# Patient Record
Sex: Female | Born: 1945 | Race: White | Hispanic: No | Marital: Married | Smoking: Never smoker
Health system: Southern US, Community
[De-identification: ages and names within clinical notes are randomized; demographics above are authoritative.]

## PROBLEM LIST (undated history)

## (undated) DIAGNOSIS — E119 Type 2 diabetes mellitus without complications: Secondary | ICD-10-CM

## (undated) DIAGNOSIS — G35 Multiple sclerosis: Secondary | ICD-10-CM

## (undated) DIAGNOSIS — I10 Essential (primary) hypertension: Secondary | ICD-10-CM

## (undated) DIAGNOSIS — G35D Multiple sclerosis, unspecified: Secondary | ICD-10-CM

## (undated) HISTORY — DX: Multiple sclerosis: G35

## (undated) HISTORY — PX: ABDOMINAL HYSTERECTOMY: SHX81

## (undated) HISTORY — DX: Type 2 diabetes mellitus without complications: E11.9

## (undated) HISTORY — PX: CATARACT EXTRACTION, BILATERAL: SHX1313

## (undated) HISTORY — PX: APPENDECTOMY: SHX54

## (undated) HISTORY — PX: REDUCTION MAMMAPLASTY: SUR839

## (undated) HISTORY — DX: Multiple sclerosis, unspecified: G35.D

---

## 1986-07-10 HISTORY — PX: BREAST REDUCTION SURGERY: SHX8

## 1994-05-04 HISTORY — PX: BOWEL RESECTION: SHX1257

## 2004-05-04 HISTORY — PX: CHOLECYSTECTOMY: SHX55

## 2006-08-10 HISTORY — PX: UMBILICAL HERNIA REPAIR: SHX196

## 2011-01-09 HISTORY — PX: TRIGGER FINGER RELEASE: SHX641

## 2011-03-09 DIAGNOSIS — R9431 Abnormal electrocardiogram [ECG] [EKG]: Secondary | ICD-10-CM | POA: Insufficient documentation

## 2012-05-10 HISTORY — PX: RADIAL STYLOIDECTOMY WRIST: SUR1065

## 2014-05-18 HISTORY — PX: DE QUERVAIN'S RELEASE: SHX1439

## 2017-05-13 LAB — HM COLONOSCOPY

## 2017-10-07 LAB — HM MAMMOGRAPHY

## 2019-12-11 ENCOUNTER — Encounter: Payer: Self-pay | Admitting: Nurse Practitioner

## 2019-12-11 ENCOUNTER — Ambulatory Visit: Payer: Medicare Other | Admitting: Nurse Practitioner

## 2019-12-13 ENCOUNTER — Ambulatory Visit: Payer: Medicare Other | Admitting: Nurse Practitioner

## 2019-12-13 ENCOUNTER — Encounter: Payer: Self-pay | Admitting: Nurse Practitioner

## 2019-12-13 ENCOUNTER — Ambulatory Visit (INDEPENDENT_AMBULATORY_CARE_PROVIDER_SITE_OTHER): Payer: Medicare Other | Admitting: Nurse Practitioner

## 2019-12-13 ENCOUNTER — Other Ambulatory Visit: Payer: Self-pay

## 2019-12-13 VITALS — BP 128/78 | HR 64 | Temp 97.6°F | Resp 18 | Ht 64.0 in | Wt 214.0 lb

## 2019-12-13 DIAGNOSIS — Z0000 Encounter for general adult medical examination without abnormal findings: Secondary | ICD-10-CM

## 2019-12-13 DIAGNOSIS — Z7689 Persons encountering health services in other specified circumstances: Secondary | ICD-10-CM

## 2019-12-13 DIAGNOSIS — E785 Hyperlipidemia, unspecified: Secondary | ICD-10-CM

## 2019-12-13 DIAGNOSIS — E1169 Type 2 diabetes mellitus with other specified complication: Secondary | ICD-10-CM | POA: Diagnosis not present

## 2019-12-13 DIAGNOSIS — G35 Multiple sclerosis: Secondary | ICD-10-CM | POA: Insufficient documentation

## 2019-12-13 MED ORDER — PIOGLITAZONE HCL 30 MG PO TABS: 30.0000 mg | ORAL_TABLET | Freq: Every day | ORAL | 3 refills | Status: DC

## 2019-12-13 NOTE — Patient Instructions (Signed)
Have records transferred  Low carbohydrate, low sugar, low fat, low cholesterol diet  Get at least 20  Minutes of exercise daily at least 4 days per week  Drink plenty of water daily  Restrict sodium to 2,000mg  per day  Follow up A1C lab every 4-6 months  Yearly labs with wellness examination

## 2019-12-13 NOTE — Progress Notes (Addendum)
New Patient Office Visit  Subjective:  Patient ID: Sonya Solomon, female    DOB: 11/02/1945  Age: 74 y.o. MRN: [id number]  CC:  Chief Complaint  Patient presents with  . Establish Care    NP    HPI Sonya Solomon is a 74 year old female presenting to clinic to establish care. She has hx of DM2, MS, Hyperlipidemia. Will complete labs today, needs refill 90 days for Actos. She has no health concerns today no medication concerns other than stated. No cp, ct, gu/gi sxs, pain, sob, edema, palpitation, or recent falls. She uses a cane for ambulation living at home with her spouse recently relocating from [place]. She has completed COVID, Tdap, PNX, Shingles vaccination, Oct due for Mammo, Cscope 3 years ago.   Pt with chronic conditions requiring chronic care monitoring and management.   Past Medical History:  Diagnosis Date  . Diabetes mellitus without complication (HCC)   . MS (multiple sclerosis) (HCC)     Past Surgical History:  Procedure Laterality Date  . ABDOMINAL HYSTERECTOMY    . APPENDECTOMY    . BOWEL RESECTION  1996  . BREAST REDUCTION SURGERY  07/10/1986  . CHOLECYSTECTOMY  05/2004  . DE QUERVAIN'S RELEASE  05/18/2014  . RADIAL STYLOIDECTOMY WRIST Left 05/10/2012  . TRIGGER FINGER RELEASE Left 01/09/2011  . UMBILICAL HERNIA REPAIR  08/10/2006    History reviewed. No pertinent family history.  Social History   Socioeconomic History  . Marital status: Married    Spouse name: Not on file  . Number of children: Not on file  . Years of education: Not on file  . Highest education level: Not on file  Occupational History  . Not on file  Tobacco Use  . Smoking status: Never Smoker  . Smokeless tobacco: Never Used  Substance and Sexual Activity  . Alcohol use: Not Currently  . Drug use: Not Currently  . Sexual activity: Not Currently  Other Topics Concern  . Not on file  Social History Narrative  . Not on file   Social Determinants of Health   Financial  Resource Strain:   . Difficulty of Paying Living Expenses:   Food Insecurity:   . Worried About Programme researcher, broadcasting/film/video in the Last Year:   . Barista in the Last Year:   Transportation Needs:   . Freight forwarder (Medical):   Marland Kitchen Lack of Transportation (Non-Medical):   Physical Activity:   . Days of Exercise per Week:   . Minutes of Exercise per Session:   Stress:   . Feeling of Stress :   Social Connections:   . Frequency of Communication with Friends and Family:   . Frequency of Social Gatherings with Friends and Family:   . Attends Religious Services:   . Active Member of Clubs or Organizations:   . Attends Banker Meetings:   Marland Kitchen Marital Status:   Intimate Partner Violence:   . Fear of Current or Ex-Partner:   . Emotionally Abused:   Marland Kitchen Physically Abused:   . Sexually Abused:     ROS Review of Systems  All other systems reviewed and are negative.   Objective:   Today's Vitals: BP 128/78 (BP Location: Left Arm, Patient Position: Sitting, Cuff Size: Large)   Pulse 64   Temp 97.6 F (36.4 C) (Temporal)   Resp 18   Ht 5\' 4"  (1.626 m)   Wt 214 lb (97.1 kg)   SpO2 98%   BMI  36.73 kg/m   Physical Exam Vitals reviewed.  Constitutional:      General: She is not in acute distress.    Appearance: Normal appearance. She is well-developed and well-groomed. She is not ill-appearing, toxic-appearing or diaphoretic.  HENT:     Head: Normocephalic and atraumatic.     Right Ear: Hearing and external ear normal.     Left Ear: Hearing and external ear normal.     Nose: Nose normal. No congestion or rhinorrhea.     Mouth/Throat:     Lips: Pink.     Mouth: Mucous membranes are moist.     Tongue: No lesions. Tongue does not deviate from midline.     Palate: No mass and lesions.     Pharynx: Oropharynx is clear. Uvula midline. No oropharyngeal exudate or posterior oropharyngeal erythema.  Eyes:     General: Lids are normal. Lids are everted, no foreign  bodies appreciated.     Extraocular Movements: Extraocular movements intact.     Conjunctiva/sclera: Conjunctivae normal.     Pupils: Pupils are equal, round, and reactive to light.  Neck:     Thyroid: No thyromegaly or thyroid tenderness.     Vascular: No carotid bruit or JVD.  Cardiovascular:     Rate and Rhythm: Normal rate and regular rhythm.     Pulses: Normal pulses.     Heart sounds: Normal heart sounds, S1 normal and S2 normal.  Pulmonary:     Effort: Pulmonary effort is normal.     Breath sounds: Normal breath sounds.  Abdominal:     General: Abdomen is flat. Bowel sounds are normal. There is no abdominal bruit.     Palpations: There is no hepatomegaly or splenomegaly.     Tenderness: There is no abdominal tenderness. There is no right CVA tenderness or left CVA tenderness.  Musculoskeletal:        General: Normal range of motion.     Right shoulder: Normal.     Left shoulder: Normal.     Cervical back: Normal, normal range of motion and neck supple.     Thoracic back: Normal.     Lumbar back: Normal.     Right lower leg: Normal. No edema.     Left lower leg: Normal. No edema.     Right ankle: Normal.     Left ankle: Normal.  Lymphadenopathy:     Head:     Right side of head: No submental, submandibular or tonsillar adenopathy.     Left side of head: No submental, submandibular or tonsillar adenopathy.     Cervical: No cervical adenopathy.  Skin:    General: Skin is warm and dry.     Capillary Refill: Capillary refill takes less than 2 seconds.     Coloration: Skin is not jaundiced or pale.  Neurological:     General: No focal deficit present.     Mental Status: She is alert and oriented to person, place, and time.     Gait: Gait abnormal.     Comments: Walks with cane h/o MS since 2000  Psychiatric:        Attention and Perception: Attention and perception normal.        Mood and Affect: Mood and affect normal.        Speech: Speech normal.        Behavior:  Behavior normal. Behavior is cooperative.        Thought Content: Thought content normal.  Cognition and Memory: Cognition normal.        Judgment: Judgment normal.     Assessment & Plan:   Problem List Items Addressed This Visit      Endocrine   Hyperlipidemia associated with type 2 diabetes mellitus (HCC)   Relevant Medications   pioglitazone (ACTOS) 30 MG tablet   Other Relevant Orders   Lipid panel   CBC with Differential/Platelet   COMPLETE METABOLIC PANEL WITH GFR   Hemoglobin A1c   Microalbumin, urine   Type 2 diabetes mellitus with other specified complication (HCC)   Relevant Medications   pioglitazone (ACTOS) 30 MG tablet   Other Relevant Orders   Lipid panel   CBC with Differential/Platelet   COMPLETE METABOLIC PANEL WITH GFR   Hemoglobin A1c   Microalbumin, urine     Nervous and Auditory   MS (multiple sclerosis) (HCC)    Other Visit Diagnoses    Adult general medical exam    -  Primary   Encounter to establish care       Relevant Orders   Lipid panel   CBC with Differential/Platelet   COMPLETE METABOLIC PANEL WITH GFR   Hemoglobin A1c   Microalbumin, urine     Pt with chronic conditions requiring chronic care monitoring and management. Will refill Actos today for management of DM  Have records transferred  Low carbohydrate, low sugar, low fat, low cholesterol diet  Get at least 20  Minutes of exercise daily at least 4 days per week  Drink plenty of water daily  Restrict sodium to 2,000mg  per day  Follow up A1C lab every 4-6 months  Yearly labs with wellness examination Outpatient Encounter Medications as of 12/13/2019  Medication Sig  . HYDROcodone-acetaminophen (NORCO) 7.5-325 MG tablet Take 1 tablet by mouth every 6 (six) hours as needed for moderate pain.  Marland Kitchen interferon beta-1a (AVONEX) 30 MCG/0.5ML PSKT injection Inject 30 mcg into the muscle every 7 (seven) days.  Marland Kitchen lisinopril (ZESTRIL) 10 MG tablet Take 10 mg by mouth daily.  Marland Kitchen  loperamide (IMODIUM) 2 MG capsule Take by mouth as needed for diarrhea or loose stools.  . metFORMIN (GLUCOPHAGE) 500 MG tablet Take by mouth 2 (two) times daily with a meal.  . Multiple Vitamin (MULTIVITAMIN) capsule Take 1 capsule by mouth daily.  . pioglitazone (ACTOS) 30 MG tablet Take 1 tablet (30 mg total) by mouth daily.  . simvastatin (ZOCOR) 40 MG tablet Take 40 mg by mouth daily.  . [DISCONTINUED] pioglitazone (ACTOS) 30 MG tablet Take 30 mg by mouth daily.   No facility-administered encounter medications on file as of 12/13/2019.    Follow-up: Return in about 6 months (around 06/14/2020).   Elmore Guise, FNP

## 2019-12-14 LAB — COMPLETE METABOLIC PANEL WITH GFR
AG Ratio: 1.9 (calc) (ref 1.0–2.5)
ALT: 20 U/L (ref 6–29)
AST: 20 U/L (ref 10–35)
Albumin: 4.5 g/dL (ref 3.6–5.1)
Alkaline phosphatase (APISO): 52 U/L (ref 37–153)
BUN/Creatinine Ratio: 19 (calc) (ref 6–22)
BUN: 22 mg/dL (ref 7–25)
CO2: 24 mmol/L (ref 20–32)
Calcium: 9.9 mg/dL (ref 8.6–10.4)
Chloride: 103 mmol/L (ref 98–110)
Creat: 1.13 mg/dL — ABNORMAL HIGH (ref 0.60–0.93)
GFR, Est African American: 56 mL/min/{1.73_m2} — ABNORMAL LOW (ref >=60)
GFR, Est Non African American: 49 mL/min/{1.73_m2} — ABNORMAL LOW (ref >=60)
Globulin: 2.4 g/dL (calc) (ref 1.9–3.7)
Glucose, Bld: 158 mg/dL — ABNORMAL HIGH (ref 65–99)
Potassium: 5.1 mmol/L (ref 3.5–5.3)
Sodium: 138 mmol/L (ref 135–146)
Total Bilirubin: 0.4 mg/dL (ref 0.2–1.2)
Total Protein: 6.9 g/dL (ref 6.1–8.1)

## 2019-12-14 LAB — CBC WITH DIFFERENTIAL/PLATELET
Absolute Monocytes: 647 cells/uL (ref 200–950)
Basophils Absolute: 59 cells/uL (ref 0–200)
Basophils Relative: 0.9 %
Eosinophils Absolute: 244 cells/uL (ref 15–500)
Eosinophils Relative: 3.7 %
HCT: 43.1 % (ref 35.0–45.0)
Hemoglobin: 14.3 g/dL (ref 11.7–15.5)
Lymphs Abs: 2396 cells/uL (ref 850–3900)
MCH: 30.2 pg (ref 27.0–33.0)
MCHC: 33.2 g/dL (ref 32.0–36.0)
MCV: 91.1 fL (ref 80.0–100.0)
MPV: 9.5 fL (ref 7.5–12.5)
Monocytes Relative: 9.8 %
Neutro Abs: 3254 cells/uL (ref 1500–7800)
Neutrophils Relative %: 49.3 %
Platelets: 291 10*3/uL (ref 140–400)
RBC: 4.73 10*6/uL (ref 3.80–5.10)
RDW: 12.6 % (ref 11.0–15.0)
Total Lymphocyte: 36.3 %
WBC: 6.6 10*3/uL (ref 3.8–10.8)

## 2019-12-14 LAB — HEMOGLOBIN A1C
Hgb A1c MFr Bld: 8.8 % of total Hgb — ABNORMAL HIGH (ref <5.7)
Mean Plasma Glucose: 206 (calc)
eAG (mmol/L): 11.4 (calc)

## 2019-12-14 LAB — MICROALBUMIN, URINE: Microalb, Ur: 1.3 mg/dL

## 2019-12-14 LAB — LIPID PANEL
Cholesterol: 136 mg/dL (ref <200)
HDL: 66 mg/dL (ref >=50)
LDL Cholesterol (Calc): 47 mg/dL (calc)
Non-HDL Cholesterol (Calc): 70 mg/dL (calc) (ref <130)
Total CHOL/HDL Ratio: 2.1 (calc) (ref <5.0)
Triglycerides: 147 mg/dL (ref <150)

## 2019-12-18 ENCOUNTER — Encounter: Payer: Self-pay | Admitting: Nurse Practitioner

## 2019-12-18 NOTE — Addendum Note (Signed)
Addended by: Lawson Fiscal on: 12/18/2019 01:14 PM   Modules accepted: Level of Service

## 2019-12-19 ENCOUNTER — Other Ambulatory Visit: Payer: Self-pay | Admitting: Nurse Practitioner

## 2019-12-19 DIAGNOSIS — E785 Hyperlipidemia, unspecified: Secondary | ICD-10-CM

## 2019-12-19 DIAGNOSIS — E1169 Type 2 diabetes mellitus with other specified complication: Secondary | ICD-10-CM

## 2019-12-19 DIAGNOSIS — I10 Essential (primary) hypertension: Secondary | ICD-10-CM

## 2019-12-19 MED ORDER — SIMVASTATIN 40 MG PO TABS: 40.0000 mg | ORAL_TABLET | Freq: Every day | ORAL | 1 refills | Status: DC

## 2019-12-19 MED ORDER — LISINOPRIL 10 MG PO TABS: 10.0000 mg | ORAL_TABLET | Freq: Every day | ORAL | 1 refills | Status: DC

## 2019-12-19 MED ORDER — METFORMIN HCL 500 MG PO TABS: 500.0000 mg | ORAL_TABLET | Freq: Two times a day (BID) | ORAL | 1 refills | Status: DC

## 2019-12-19 MED ORDER — PIOGLITAZONE HCL 30 MG PO TABS: 30.0000 mg | ORAL_TABLET | Freq: Every day | ORAL | 1 refills | Status: DC

## 2019-12-20 ENCOUNTER — Other Ambulatory Visit: Payer: Self-pay | Admitting: Nurse Practitioner

## 2019-12-20 DIAGNOSIS — E1169 Type 2 diabetes mellitus with other specified complication: Secondary | ICD-10-CM

## 2019-12-20 MED ORDER — METFORMIN HCL 1000 MG PO TABS: 1000.0000 mg | ORAL_TABLET | Freq: Two times a day (BID) | ORAL | 3 refills | Status: DC

## 2019-12-20 NOTE — Progress Notes (Signed)
Increase metformin to 1000 mg twice daily. Recheck A1C in 4 months, DM diet and exercise at least 20 minutes per day at least 4 times per week

## 2019-12-21 ENCOUNTER — Encounter: Payer: Self-pay | Admitting: Nurse Practitioner

## 2019-12-21 ENCOUNTER — Other Ambulatory Visit: Payer: Self-pay | Admitting: Nurse Practitioner

## 2019-12-25 ENCOUNTER — Other Ambulatory Visit: Payer: Self-pay | Admitting: Nurse Practitioner

## 2019-12-25 DIAGNOSIS — E1169 Type 2 diabetes mellitus with other specified complication: Secondary | ICD-10-CM

## 2019-12-25 MED ORDER — PIOGLITAZONE HCL 45 MG PO TABS: 45.0000 mg | ORAL_TABLET | Freq: Every day | ORAL | 6 refills | Status: DC

## 2020-01-01 ENCOUNTER — Other Ambulatory Visit: Payer: Self-pay

## 2020-01-01 DIAGNOSIS — E1169 Type 2 diabetes mellitus with other specified complication: Secondary | ICD-10-CM

## 2020-01-01 MED ORDER — PIOGLITAZONE HCL 15 MG PO TABS
15.0000 mg | ORAL_TABLET | Freq: Every day | ORAL | 0 refills | Status: DC
Start: 2020-01-01 — End: 2020-06-28

## 2020-01-17 ENCOUNTER — Ambulatory Visit: Payer: Medicare Other | Admitting: Neurology

## 2020-03-26 ENCOUNTER — Telehealth: Payer: Self-pay | Admitting: *Deleted

## 2020-03-26 ENCOUNTER — Other Ambulatory Visit: Payer: Self-pay | Admitting: *Deleted

## 2020-03-26 ENCOUNTER — Ambulatory Visit (INDEPENDENT_AMBULATORY_CARE_PROVIDER_SITE_OTHER): Payer: Medicare Other | Admitting: Neurology

## 2020-03-26 ENCOUNTER — Telehealth: Payer: Self-pay | Admitting: Neurology

## 2020-03-26 ENCOUNTER — Encounter: Payer: Self-pay | Admitting: Neurology

## 2020-03-26 VITALS — BP 127/75 | HR 54 | Ht 64.0 in | Wt 210.5 lb

## 2020-03-26 DIAGNOSIS — R42 Dizziness and giddiness: Secondary | ICD-10-CM | POA: Diagnosis not present

## 2020-03-26 DIAGNOSIS — G35 Multiple sclerosis: Secondary | ICD-10-CM

## 2020-03-26 DIAGNOSIS — R269 Unspecified abnormalities of gait and mobility: Secondary | ICD-10-CM

## 2020-03-26 DIAGNOSIS — H532 Diplopia: Secondary | ICD-10-CM | POA: Diagnosis not present

## 2020-03-26 DIAGNOSIS — R3915 Urgency of urination: Secondary | ICD-10-CM

## 2020-03-26 DIAGNOSIS — I10 Essential (primary) hypertension: Secondary | ICD-10-CM

## 2020-03-26 MED ORDER — SOLIFENACIN SUCCINATE 5 MG PO TABS: 5.0000 mg | ORAL_TABLET | Freq: Every day | ORAL | 11 refills | Status: DC

## 2020-03-26 MED ORDER — LISINOPRIL 10 MG PO TABS: 10.0000 mg | ORAL_TABLET | Freq: Every day | ORAL | 1 refills | Status: DC

## 2020-03-26 MED ORDER — HYDROCODONE-ACETAMINOPHEN 7.5-325 MG PO TABS
ORAL_TABLET | ORAL | 0 refills | Status: DC
Start: 2020-03-26 — End: 2020-05-04

## 2020-03-26 MED ORDER — ALPRAZOLAM 0.5 MG PO TABS: ORAL_TABLET | ORAL | 0 refills | Status: DC

## 2020-03-26 NOTE — Telephone Encounter (Signed)
Medicare/BCBS supp order sent to [facility]. No auth they will reach out to the patient to schedule.  

## 2020-03-26 NOTE — Progress Notes (Signed)
[facility]  PATIENT: Sonya Solomon DOB: 11/30/1945  REFERRING DOCTOR OR PCP: Dr. Jeanice Lim SOURCE: Patient, notes from [facility] neurology, laboratory reports  _________________________________   HISTORICAL  CHIEF COMPLAINT:  Chief Complaint  Patient presents with  . New Patient (Initial Visit)    RM 13, alone. Paper referral from Markham Jordan, PA-C for MS. From [facility]. Dx in 2000. On Avonex, has never been on any other DMT.   . Gait Problem    Ambulates with cane. She has severe stabbing pain in legs. This is chronic. Takes hydrocodone for this reason. Been out of this for weeks.     HISTORY OF PRESENT ILLNESS:  I had the pleasure of seeing your patient, Sonya Solomon, at [facility] for neurologic consultation regarding her multiple sclerosis.  She is a 74 year old woman who was diagnosed with MS in 2000 after presenting with diplopia and vertigo.  An MRI was consistent with MS and she was diagnosed with relapsing remitting MS.   She received several days of IV steroid and was placed on Avonex.   She gets intermittent vertigo but has not had ant definite exacerbation.     She has stayed on Avonex and feels stable.  She takes it every other week and tolerates it well.  She was diagnosed while living in [place] but moved shortly thereafter to [facility] and has been seen Wilber Oliphant most of that time  Currently, she notes balance is reduced and she uses a cane.   She has trouble with stairs.  She notes mild bilateral leg weakness.    She has no numbness but gets stabbing pain in both legs.   The pain radiates intot the lower legs and right foot but not left foot. In the right foot the pain radiates into the second toe but not all of the toes.  She denies lower back pain.   She takes hydrocodone..  She has urinary urgency and frequency, worse at night.   She has never tried a bladder medication.    She feels her vision is doing ok.   She had bilateral  cataract surgery in 2020.     She notes fatigue but it does not prevent her from accomplishing tasks.   She wake sup several times at night with nocturia but quickly falls back asleep.   Mood is doing well.  Cognition is doing well  Her last MRI was in 2012.  I do not have the images (they have been requested) but the report states that there were 3 new small foci of plaque enhancement in the right cerebral hemisphere and resolution of enhancement of a plaque in the right middle cerebellar peduncle that had been seen on a previous MRI (date unknown).  Additionally there are multiple chronic foci in the supratentorial and infratentorial white matter consistent with MS..      She has NIDDM and HTN.  Vitamin D was normal (52) in 2017     REVIEW OF SYSTEMS: Constitutional: No fevers, chills, sweats, or change in appetite Eyes: No visual changes, double vision, eye pain Ear, nose and throat: No hearing loss, ear pain, nasal congestion, sore throat Cardiovascular: No chest pain, palpitations Respiratory: No shortness of breath at rest or with exertion.   No wheezes GastrointestinaI: No nausea, vomiting, diarrhea, abdominal pain, fecal incontinence Genitourinary: No dysuria, urinary retention or frequency.  No nocturia. Musculoskeletal: No neck pain, back pain Integumentary: No rash, pruritus, skin lesions Neurological: as above Psychiatric: No depression at this  time.  No anxiety Endocrine: No palpitations, diaphoresis, change in appetite, change in weigh or increased thirst Hematologic/Lymphatic: No anemia, purpura, petechiae. Allergic/Immunologic: No itchy/runny eyes, nasal congestion, recent allergic reactions, rashes  ALLERGIES: Allergies  Allergen Reactions  . Latex Rash    HOME MEDICATIONS:  Current Outpatient Medications:  .  HYDROcodone-acetaminophen (NORCO) 7.5-325 MG tablet, 1/2 to 1 pill po bid, Disp: 45 tablet, Rfl: 0 .  interferon beta-1a (AVONEX) 30 MCG/0.5ML PSKT  injection, Inject 30 mcg into the muscle every 7 (seven) days., Disp: , Rfl:  .  lisinopril (ZESTRIL) 10 MG tablet, Take 1 tablet (10 mg total) by mouth daily., Disp: 90 tablet, Rfl: 1 .  loperamide (IMODIUM) 2 MG capsule, Take by mouth as needed for diarrhea or loose stools., Disp: , Rfl:  .  metFORMIN (GLUCOPHAGE) 1000 MG tablet, Take 1 tablet (1,000 mg total) by mouth 2 (two) times daily with a meal., Disp: 180 tablet, Rfl: 3 .  Multiple Vitamin (MULTIVITAMIN) capsule, Take 1 capsule by mouth daily., Disp: , Rfl:  .  pioglitazone (ACTOS) 15 MG tablet, Take 1 tablet (15 mg total) by mouth daily., Disp: 90 tablet, Rfl: 0 .  pioglitazone (ACTOS) 45 MG tablet, Take 1 tablet (45 mg total) by mouth daily., Disp: 30 tablet, Rfl: 6 .  simvastatin (ZOCOR) 40 MG tablet, Take 1 tablet (40 mg total) by mouth daily., Disp: 90 tablet, Rfl: 1 .  ALPRAZolam (XANAX) 0.5 MG tablet, Take one or two before MRI, Disp: 2 tablet, Rfl: 0 .  solifenacin (VESICARE) 5 MG tablet, Take 1 tablet (5 mg total) by mouth daily., Disp: 30 tablet, Rfl: 11  PAST MEDICAL HISTORY: Past Medical History:  Diagnosis Date  . Diabetes mellitus without complication (HCC)   . MS (multiple sclerosis) (HCC)     PAST SURGICAL HISTORY: Past Surgical History:  Procedure Laterality Date  . ABDOMINAL HYSTERECTOMY    . APPENDECTOMY    . BOWEL RESECTION  1996  . BREAST REDUCTION SURGERY  07/10/1986  . CATARACT EXTRACTION, BILATERAL     12/21/19, 09/19.21  . CHOLECYSTECTOMY  05/2004  . DE QUERVAIN'S RELEASE  05/18/2014  . RADIAL STYLOIDECTOMY WRIST Left 05/10/2012  . TRIGGER FINGER RELEASE Left 01/09/2011  . UMBILICAL HERNIA REPAIR  08/10/2006    FAMILY HISTORY: Family History  Problem Relation Age of Onset  . Breast cancer Mother   . Heart attack Father     SOCIAL HISTORY:  Social History   Socioeconomic History  . Marital status: Married    Spouse name: Not on file  . Number of children: Not on file  . Years of  education: Not on file  . Highest education level: Not on file  Occupational History  . Not on file  Tobacco Use  . Smoking status: Never Smoker  . Smokeless tobacco: Never Used  Substance and Sexual Activity  . Alcohol use: Never  . Drug use: Never  . Sexual activity: Not Currently  Other Topics Concern  . Not on file  Social History Narrative   Lives with husband   Caffeine use: 2 cups per day   Right handed    Social Determinants of Health   Financial Resource Strain:   . Difficulty of Paying Living Expenses: Not on file  Food Insecurity:   . Worried About Programme researcher, broadcasting/film/video in the Last Year: Not on file  . Ran Out of Food in the Last Year: Not on file  Transportation Needs:   . Lack of Transportation (  Medical): Not on file  . Lack of Transportation (Non-Medical): Not on file  Physical Activity:   . Days of Exercise per Week: Not on file  . Minutes of Exercise per Session: Not on file  Stress:   . Feeling of Stress : Not on file  Social Connections:   . Frequency of Communication with Friends and Family: Not on file  . Frequency of Social Gatherings with Friends and Family: Not on file  . Attends Religious Services: Not on file  . Active Member of Clubs or Organizations: Not on file  . Attends Banker Meetings: Not on file  . Marital Status: Not on file  Intimate Partner Violence:   . Fear of Current or Ex-Partner: Not on file  . Emotionally Abused: Not on file  . Physically Abused: Not on file  . Sexually Abused: Not on file     PHYSICAL EXAM  Vitals:   03/26/20 1317  BP: 127/75  Pulse: (!) 54  SpO2: 98%  Weight: 210 lb 8 oz (95.5 kg)  Height: 5\' 4"  (1.626 m)    Body mass index is 36.13 kg/m.   General: The patient is well-developed and well-nourished and in no acute distress  HEENT:  Head is [place]/AT.  Sclera are anicteric.  Funduscopic exam shows normal optic discs and retinal vessels.  Neck: No carotid bruits are noted.  The neck is  nontender.  Cardiovascular: The heart has a regular rate and rhythm with a normal S1 and S2. There were no murmurs, gallops or rubs.    Skin: Extremities are without rash or  edema.  Musculoskeletal:  Back is nontender  Neurologic Exam  Mental status: The patient is alert and oriented x 3 at the time of the examination. The patient has apparent normal recent and remote memory, with an apparently normal attention span and concentration ability.   Speech is normal.  Cranial nerves: Extraocular movements are full. Pupils are equal, round, and reactive to light and accomodation.  Visual fields are full.  Facial symmetry is present. There is good facial sensation to soft touch bilaterally.Facial strength is normal.  Trapezius and sternocleidomastoid strength is normal. No dysarthria is noted.  The tongue is midline, and the patient has symmetric elevation of the soft palate. No obvious hearing deficits are noted.  Motor:  Muscle bulk is normal.   Tone is normal. Strength is  5 / 5 in all 4 extremities.   Sensory: Sensory testing is intact to pinprick, soft touch and vibration sensation in all 4 extremities.  Coordination: Cerebellar testing reveals good finger-nose-finger and heel-to-shin bilaterally.  Gait and station: Station is normal.   Gait is normal. Tandem gait is normal. Romberg is negative.   Reflexes: Deep tendon reflexes are symmetric and normal bilaterally.   Plantar responses are flexor.    DIAGNOSTIC DATA (LABS, IMAGING, TESTING) - I reviewed patient records, labs, notes, testing and imaging myself where available.  Lab Results  Component Value Date   WBC 6.6 12/13/2019   HGB 14.3 12/13/2019   HCT 43.1 12/13/2019   MCV 91.1 12/13/2019   PLT 291 12/13/2019      Component Value Date/Time   NA 138 12/13/2019 1502   K 5.1 12/13/2019 1502   CL 103 12/13/2019 1502   CO2 24 12/13/2019 1502   GLUCOSE 158 (H) 12/13/2019 1502   BUN 22 12/13/2019 1502   CREATININE 1.13 (H)  12/13/2019 1502   CALCIUM 9.9 12/13/2019 1502   PROT 6.9 12/13/2019 1502  AST 20 12/13/2019 1502   ALT 20 12/13/2019 1502   BILITOT 0.4 12/13/2019 1502   GFRNONAA 49 (L) 12/13/2019 1502   GFRAA 56 (L) 12/13/2019 1502   Lab Results  Component Value Date   CHOL 136 12/13/2019   HDL 66 12/13/2019   LDLCALC 47 12/13/2019   TRIG 147 12/13/2019   CHOLHDL 2.1 12/13/2019   Lab Results  Component Value Date   HGBA1C 8.8 (H) 12/13/2019       ASSESSMENT AND PLAN  MS (multiple sclerosis) (HCC) - Plan: MR BRAIN WO CONTRAST  Vertigo  Diplopia  Gait disturbance  Urinary urgency   In summary, Sonya Solomon is a 74 year old woman who was diagnosed with MS in 2000 after presenting with vertigo and diplopia.  She has been on Avonex since shortly after diagnosis.  She tolerates it well and has not had any clinical relapses.  However, the MRI report from 2012 showed that there were 3 new small foci that enhanced.  That would be worrisome for breakthrough disease.  I have requested the MRI images from 2012 and we will recheck an MRI to determine if there has been much change.  She prefers to do the MRI without contrast.  If there has been change over time we would need to consider a different disease modifying therapy.  To help with urinary urgency at night I will start solifenacin 5 mg.  Hopefully that will be well-tolerated.  She takes hydrocodone 1/2 to 2 pills a day and I will go ahead and prescribe up to 45 pills a month for her.  She will return to see me in 6 months or sooner if there are new or worsening neurologic symptoms.  Thank you for asking me to see Sonya Solomon.  Please let me know if I can be of further assistance with her or other patients in the future.   Marks Scalera A. Epimenio Foot, MD, [facility] 03/26/2020, 2:34 PM Certified in Neurology, Clinical Neurophysiology, Sleep Medicine and Neuroimaging  [facility] [address] [place], [place] [address] [phone]

## 2020-03-26 NOTE — Telephone Encounter (Signed)
Request fax to [facility] [phone]

## 2020-04-16 ENCOUNTER — Other Ambulatory Visit: Payer: Self-pay | Admitting: Family Medicine

## 2020-04-16 DIAGNOSIS — Z0000 Encounter for general adult medical examination without abnormal findings: Secondary | ICD-10-CM

## 2020-04-20 ENCOUNTER — Ambulatory Visit
Admission: RE | Admit: 2020-04-20 | Discharge: 2020-04-20 | Disposition: A | Discharge status: Home / Self Care | Payer: Medicare Other | Source: Ambulatory Visit | Attending: Neurology | Admitting: Neurology

## 2020-04-20 ENCOUNTER — Other Ambulatory Visit: Payer: Self-pay

## 2020-04-20 DIAGNOSIS — G35 Multiple sclerosis: Secondary | ICD-10-CM

## 2020-04-22 ENCOUNTER — Telehealth: Payer: Self-pay | Admitting: *Deleted

## 2020-04-22 NOTE — Telephone Encounter (Signed)
R/c cd from [facility] cd on [employer]

## 2020-04-29 ENCOUNTER — Telehealth: Payer: Self-pay | Admitting: Neurology

## 2020-04-29 NOTE — Telephone Encounter (Signed)
I compared the brain MRI from 11/06/2010 to the recent one 04/20/2020 --- she does show significant progression over that time.   MRI in 2012 showed 3 enhancing lesions    At her request 2021 MRI was without contrast.

## 2020-05-04 ENCOUNTER — Other Ambulatory Visit: Payer: Self-pay

## 2020-05-06 ENCOUNTER — Encounter: Payer: Self-pay | Admitting: Family Medicine

## 2020-05-06 MED ORDER — HYDROCODONE-ACETAMINOPHEN 7.5-325 MG PO TABS: ORAL_TABLET | ORAL | 0 refills | Status: DC

## 2020-05-07 MED ORDER — BLOOD GLUCOSE TEST VI STRP: ORAL_STRIP | 3 refills | Status: DC

## 2020-05-10 MED ORDER — LOPERAMIDE HCL 2 MG PO CAPS: 2.0000 mg | ORAL_CAPSULE | ORAL | 3 refills | Status: DC | PRN

## 2020-05-10 MED ORDER — BLOOD GLUCOSE SYSTEM PAK KIT: PACK | 1 refills | Status: DC

## 2020-05-13 MED ORDER — BLOOD GLUCOSE TEST VI STRP
ORAL_STRIP | 3 refills | Status: DC
Start: 2020-05-13 — End: 2020-08-08

## 2020-05-27 ENCOUNTER — Ambulatory Visit: Payer: Medicare Other

## 2020-06-03 ENCOUNTER — Other Ambulatory Visit: Payer: Self-pay

## 2020-06-04 MED ORDER — HYDROCODONE-ACETAMINOPHEN 7.5-325 MG PO TABS: ORAL_TABLET | ORAL | 0 refills | Status: DC

## 2020-06-08 LAB — HEMOGLOBIN A1C: Hemoglobin A1C: 8.9

## 2020-06-10 ENCOUNTER — Other Ambulatory Visit: Payer: Self-pay | Admitting: Neurology

## 2020-06-10 MED ORDER — SOLIFENACIN SUCCINATE 10 MG PO TABS
10.0000 mg | ORAL_TABLET | Freq: Every day | ORAL | 11 refills | Status: DC
Start: 2020-06-10 — End: 2021-02-28

## 2020-06-26 ENCOUNTER — Other Ambulatory Visit: Payer: Self-pay

## 2020-06-26 MED ORDER — HYDROCODONE-ACETAMINOPHEN 7.5-325 MG PO TABS: ORAL_TABLET | ORAL | 0 refills | Status: DC

## 2020-06-27 NOTE — Telephone Encounter (Signed)
Called pt. She has surgery on Tuesday. Aware I will address with Dr. Epimenio Foot on Monday when he returns to see if he agrees to have her refill early on Monday. I will then all her Monday to let her know.

## 2020-06-28 ENCOUNTER — Encounter: Payer: Self-pay | Admitting: Nurse Practitioner

## 2020-06-28 ENCOUNTER — Telehealth: Payer: Self-pay

## 2020-06-28 ENCOUNTER — Ambulatory Visit (INDEPENDENT_AMBULATORY_CARE_PROVIDER_SITE_OTHER): Payer: Medicare Other | Admitting: Nurse Practitioner

## 2020-06-28 ENCOUNTER — Other Ambulatory Visit: Payer: Self-pay

## 2020-06-28 VITALS — BP 122/82 | HR 84 | Temp 97.2°F | Wt 216.0 lb

## 2020-06-28 DIAGNOSIS — R9431 Abnormal electrocardiogram [ECG] [EKG]: Secondary | ICD-10-CM

## 2020-06-28 DIAGNOSIS — E1169 Type 2 diabetes mellitus with other specified complication: Secondary | ICD-10-CM

## 2020-06-28 DIAGNOSIS — Z01818 Encounter for other preprocedural examination: Secondary | ICD-10-CM | POA: Diagnosis not present

## 2020-06-28 MED ORDER — SEMAGLUTIDE(0.25 OR 0.5MG/DOS) 2 MG/1.5ML ~~LOC~~ SOPN: PEN_INJECTOR | SUBCUTANEOUS | 0 refills | Status: DC

## 2020-06-28 NOTE — Patient Instructions (Addendum)
Follow up in 4 weeks for DM

## 2020-06-28 NOTE — Telephone Encounter (Signed)
Pre op forms faxed to [facility] . Pt has not been cleared for surgery

## 2020-06-28 NOTE — Assessment & Plan Note (Addendum)
First noted a few years ago.  EKG looks similar today, and no complains of chest pain or shortness of breath.  Okay to be cleared for surgery on knee from cardiac standpoint, however not cleared from a medical standpoint due to the A1c elevation.  We will follow up on diabetes in 1 month.

## 2020-06-28 NOTE — Telephone Encounter (Signed)
Spoke with Nettie Elm at [facility]. Informed that pt will not be cleared for surgery due to high A1c.

## 2020-06-28 NOTE — Progress Notes (Signed)
Subjective:    Patient ID: Sonya Solomon, female    DOB: 1945-09-20, 75 y.o.   MRN: [id number]  HPI: Sonya Solomon is a 75 y.o. female presenting for surgical clearance.  Chief Complaint  Patient presents with  . Pre-op Exam   Patient presents for surgical clearance.  Is planning to have a right knee athroscopy on 07/03/2020.  DIABETES Last A1c in our clinic was 8.8% in August.  Actos was increased and A1c was supposed to be rechecked in December but this did not happen.  At Orthopedic office earlier this month, A1c was 8.9%.    Hypoglycemic episodes:no Polydipsia/polyuria: no Visual disturbance: no Chest pain: no Paresthesias: no Glucose Monitoring: seldom  Accucheck frequency: rare  Fasting glucose: 170s Taking Insulin?: no Blood Pressure Monitoring: not checking Retinal Examination: Not up to Date Foot Exam: Not up to Date Diabetic Education: Completed Pneumovax: Up to Date Influenza: Up to Date Aspirin: no  Allergies  Allergen Reactions  . Latex Rash    Outpatient Encounter Medications as of 06/28/2020  Medication Sig  . Blood Glucose Monitoring Suppl (BLOOD GLUCOSE SYSTEM PAK) KIT Please dispense as AccuChek. Use as directed to monitor FSBS 2x daily. Dx: E11.9  . Glucose Blood (BLOOD GLUCOSE TEST STRIPS) STRP Please dispense as Accu-Chek Guide. Use as directed to monitor FSBS 2x daily. Dx: E11.9.  . HYDROcodone-acetaminophen (NORCO) 7.5-325 MG tablet 1/2 to 1 pill po bid  . interferon beta-1a (AVONEX) 30 MCG/0.5ML PSKT injection Inject 30 mcg into the muscle every 7 (seven) days.  Marland Kitchen lisinopril (ZESTRIL) 10 MG tablet Take 1 tablet (10 mg total) by mouth daily.  Marland Kitchen loperamide (IMODIUM) 2 MG capsule Take 1 capsule (2 mg total) by mouth as needed for diarrhea or loose stools.  . metFORMIN (GLUCOPHAGE) 1000 MG tablet Take 1 tablet (1,000 mg total) by mouth 2 (two) times daily with a meal.  . Multiple Vitamin (MULTIVITAMIN) capsule Take 1 capsule by mouth daily.  .  pioglitazone (ACTOS) 45 MG tablet Take 1 tablet (45 mg total) by mouth daily.  . Semaglutide,0.25 or 0.5MG/DOS, 2 MG/1.5ML SOPN Inject 0.25 mg into the skin once a week for 28 days, THEN 0.5 mg once a week for 28 days, THEN 1 mg once a week for 28 days. Start 0.25 mg once weekly.  . simvastatin (ZOCOR) 40 MG tablet Take 1 tablet (40 mg total) by mouth daily.  . solifenacin (VESICARE) 10 MG tablet Take 1 tablet (10 mg total) by mouth daily.  . [DISCONTINUED] ALPRAZolam (XANAX) 0.5 MG tablet Take one or two before MRI  . [DISCONTINUED] pioglitazone (ACTOS) 15 MG tablet Take 1 tablet (15 mg total) by mouth daily.   No facility-administered encounter medications on file as of 06/28/2020.    Patient Active Problem List   Diagnosis Date Noted  . Vertigo 03/26/2020  . Diplopia 03/26/2020  . Gait disturbance 03/26/2020  . Urinary urgency 03/26/2020  . MS (multiple sclerosis) ([place]) 12/13/2019  . Hyperlipidemia associated with type 2 diabetes mellitus ([place]) 12/13/2019  . Type 2 diabetes mellitus with other specified complication ([place]) 18/56/3149  . Abnormal electrocardiogram 03/09/2011    Past Medical History:  Diagnosis Date  . Diabetes mellitus without complication ([place])   . MS (multiple sclerosis) ([place])     Relevant past medical, surgical, family and social history reviewed and updated as indicated. Interim medical history since our last visit reviewed.  Review of Systems Per HPI unless specifically indicated above     Objective:  BP 122/82   Pulse 84   Temp (!) 97.2 F (36.2 C)   Wt 216 lb (98 kg)   SpO2 99%   BMI 37.08 kg/m   Wt Readings from Last 3 Encounters:  06/28/20 216 lb (98 kg)  03/26/20 210 lb 8 oz (95.5 kg)  12/13/19 214 lb (97.1 kg)    Physical Exam Vitals and nursing note reviewed.  Constitutional:      General: She is not in acute distress.    Appearance: Normal appearance. She is obese. She is not toxic-appearing.  Eyes:     General: No scleral  icterus.    Extraocular Movements: Extraocular movements intact.  Cardiovascular:     Rate and Rhythm: Normal rate and regular rhythm.  Pulmonary:     Effort: Pulmonary effort is normal. No respiratory distress.  Skin:    General: Skin is warm and dry.     Capillary Refill: Capillary refill takes less than 2 seconds.     Coloration: Skin is not jaundiced or pale.     Findings: No erythema.  Neurological:     Mental Status: She is alert and oriented to person, place, and time.  Psychiatric:        Mood and Affect: Mood normal.        Behavior: Behavior normal.        Thought Content: Thought content normal.        Judgment: Judgment normal.     Results for orders placed or performed in visit on 06/28/20  Hemoglobin A1c  Result Value Ref Range   Hemoglobin A1C 8.9       Assessment & Plan:   Problem List Items Addressed This Visit      Endocrine   Type 2 diabetes mellitus with other specified complication (HCC)    Chronic, ongoing.  A1c earlier this month was 8.9%.  Unfortunately, A1c needs to be less than 7.5% to be cleared for orthopedic surgery.  Patient will not be cleared for surgery today.  Paperwork will be faxed back to orthopedic office.  We will continue Metformin 1000 mg twice daily along with Actos 45 mg once daily.  We will also add in semaglutide 0.25 mg weekly for 4 weeks, then increase to 0.5 mg weekly for 4 weeks.  Follow-up in 4 weeks.  Needs eye exam, foot exam.  Up-to-date on pneumonia and flu vaccines.      Relevant Medications   Semaglutide,0.25 or 0.5MG/DOS, 2 MG/1.5ML SOPN     Other   Abnormal electrocardiogram    First noted a few years ago.  EKG looks similar today, and no complains of chest pain or shortness of breath.  Okay to be cleared for surgery on knee from cardiac standpoint, however not cleared from a medical standpoint due to the A1c elevation.  We will follow up on diabetes in 1 month.       Other Visit Diagnoses    Pre-op evaluation     -  Primary   Relevant Orders   EKG 12-Lead (Completed)       Follow up plan: Return in about 4 weeks (around 07/26/2020) for Diabetes.

## 2020-06-28 NOTE — Assessment & Plan Note (Addendum)
Chronic, ongoing.  A1c earlier this month was 8.9%.  Unfortunately, A1c needs to be less than 7.5% to be cleared for orthopedic surgery.  Patient will not be cleared for surgery today.  Paperwork will be faxed back to orthopedic office.  We will continue Metformin 1000 mg twice daily along with Actos 45 mg once daily.  We will also add in semaglutide 0.25 mg weekly for 4 weeks, then increase to 0.5 mg weekly for 4 weeks.  Follow-up in 4 weeks.  Needs eye exam, foot exam.  Up-to-date on pneumonia and flu vaccines.

## 2020-06-30 ENCOUNTER — Encounter: Payer: Self-pay | Admitting: Nurse Practitioner

## 2020-07-01 ENCOUNTER — Telehealth: Payer: Self-pay | Admitting: Family Medicine

## 2020-07-01 NOTE — Telephone Encounter (Signed)
Spoke with pt, due to the cost of the insulin originally prescribed she will not be starting this medication. Asking for alternative.

## 2020-07-01 NOTE — Telephone Encounter (Signed)
Spoke with Dr. Epimenio Foot, he ok'd for pt to fill rx today. I called pharmacy and spoke with [facility]. She will get rx ready for pt to pick up today. I called pt and let her know, she verbalized understanding.

## 2020-07-01 NOTE — Telephone Encounter (Signed)
I have a free trial card for Ozempic that Medicare patients can use.  I would also get her income information and we could look at patient assistance should she qualify and need to remain on medication long term.

## 2020-07-01 NOTE — Telephone Encounter (Signed)
Please have patient call insurance and ask if Trulicity is covered?  We have samples of this we can get her started on but will need to figure out long-term solution.  Thayer Ohm - do you have any ideas?  Patient is T2DM A1c 8.9% on Metformin and Actos already.  Insurance will not cover Ozempic.

## 2020-07-01 NOTE — Telephone Encounter (Signed)
Noted, patient came by to pick up the ozempic free trial card and application for assistance to pay for medication

## 2020-07-01 NOTE — Telephone Encounter (Signed)
Pt came by and picked up info for med.   Cb#: [phone]

## 2020-07-04 ENCOUNTER — Telehealth: Payer: Self-pay | Admitting: *Deleted

## 2020-07-04 NOTE — Telephone Encounter (Signed)
Received request from pharmacy for PA on Ozempic.  PA submitted.   Dx: E11.9- DM, E78.5- HLD.  Your information has been sent to [facility].

## 2020-07-10 ENCOUNTER — Ambulatory Visit: Payer: Medicare Other

## 2020-07-10 NOTE — Telephone Encounter (Signed)
Received PA determination.   PA approved.  

## 2020-07-16 ENCOUNTER — Encounter: Payer: Self-pay | Admitting: Nurse Practitioner

## 2020-07-22 ENCOUNTER — Telehealth: Payer: Self-pay

## 2020-07-22 NOTE — Telephone Encounter (Signed)
[employer] Patient Assistance application has been faxed to [phone]

## 2020-07-24 ENCOUNTER — Telehealth: Payer: Self-pay

## 2020-07-24 DIAGNOSIS — E1169 Type 2 diabetes mellitus with other specified complication: Secondary | ICD-10-CM

## 2020-07-24 MED ORDER — PIOGLITAZONE HCL 45 MG PO TABS: 45.0000 mg | ORAL_TABLET | Freq: Every day | ORAL | 3 refills | Status: DC

## 2020-07-24 NOTE — Telephone Encounter (Signed)
Med refill pioglitazone (ACTOS) 45 MG tablet  Pharmacy [id number] [place], [place] - [address] AT [facility] [address]  [address], [place] [address]  Phone:  [phone] Fax:  [phone]

## 2020-07-24 NOTE — Telephone Encounter (Signed)
Refill appropriate and sent to pharmacy

## 2020-07-25 ENCOUNTER — Ambulatory Visit: Payer: Medicare Other

## 2020-07-30 ENCOUNTER — Other Ambulatory Visit: Payer: Self-pay

## 2020-07-31 MED ORDER — HYDROCODONE-ACETAMINOPHEN 7.5-325 MG PO TABS: ORAL_TABLET | ORAL | 0 refills | Status: DC

## 2020-08-05 ENCOUNTER — Other Ambulatory Visit: Payer: Self-pay | Admitting: *Deleted

## 2020-08-05 ENCOUNTER — Encounter: Payer: Self-pay | Admitting: Nurse Practitioner

## 2020-08-05 DIAGNOSIS — E1169 Type 2 diabetes mellitus with other specified complication: Secondary | ICD-10-CM

## 2020-08-05 MED ORDER — OZEMPIC (1 MG/DOSE) 2 MG/1.5ML ~~LOC~~ SOPN: 1.0000 mg | PEN_INJECTOR | SUBCUTANEOUS | 3 refills | Status: DC

## 2020-08-07 ENCOUNTER — Other Ambulatory Visit: Payer: Self-pay | Admitting: Family Medicine

## 2020-08-12 NOTE — Telephone Encounter (Signed)
I do have a copy for the forms. Form was faxed 07/22/20

## 2020-08-13 NOTE — Telephone Encounter (Signed)
Pt is ok with starting trulicity while app is being processed. Will pick up today

## 2020-08-13 NOTE — Telephone Encounter (Signed)
[facility], per the automated service,  there were some things missing on the app. Pt sig, NP licence ex date and pt insurance info. Forms placed on NP desk to add ex date of license. Insurance proof had been been printed. Will fax once completed.

## 2020-08-26 ENCOUNTER — Other Ambulatory Visit: Payer: Self-pay

## 2020-08-26 ENCOUNTER — Encounter: Payer: Self-pay | Admitting: Nurse Practitioner

## 2020-08-26 ENCOUNTER — Other Ambulatory Visit: Payer: Medicare Other

## 2020-08-26 ENCOUNTER — Ambulatory Visit: Payer: Medicare Other | Admitting: Nurse Practitioner

## 2020-08-26 DIAGNOSIS — E785 Hyperlipidemia, unspecified: Secondary | ICD-10-CM

## 2020-08-26 DIAGNOSIS — Z1159 Encounter for screening for other viral diseases: Secondary | ICD-10-CM

## 2020-08-26 DIAGNOSIS — Z13220 Encounter for screening for lipoid disorders: Secondary | ICD-10-CM

## 2020-08-26 DIAGNOSIS — I10 Essential (primary) hypertension: Secondary | ICD-10-CM

## 2020-08-26 DIAGNOSIS — G35 Multiple sclerosis: Secondary | ICD-10-CM

## 2020-08-26 DIAGNOSIS — Z01818 Encounter for other preprocedural examination: Secondary | ICD-10-CM

## 2020-08-26 DIAGNOSIS — Z9189 Other specified personal risk factors, not elsewhere classified: Secondary | ICD-10-CM

## 2020-08-26 DIAGNOSIS — E1169 Type 2 diabetes mellitus with other specified complication: Secondary | ICD-10-CM

## 2020-08-27 LAB — CBC WITH DIFFERENTIAL/PLATELET
Absolute Monocytes: 439 cells/uL (ref 200–950)
Basophils Absolute: 39 cells/uL (ref 0–200)
Basophils Relative: 0.5 %
Eosinophils Absolute: 169 cells/uL (ref 15–500)
Eosinophils Relative: 2.2 %
HCT: 41 % (ref 35.0–45.0)
Hemoglobin: 13.1 g/dL (ref 11.7–15.5)
Lymphs Abs: 2718 cells/uL (ref 850–3900)
MCH: 30 pg (ref 27.0–33.0)
MCHC: 32 g/dL (ref 32.0–36.0)
MCV: 93.8 fL (ref 80.0–100.0)
MPV: 8.9 fL (ref 7.5–12.5)
Monocytes Relative: 5.7 %
Neutro Abs: 4335 cells/uL (ref 1500–7800)
Neutrophils Relative %: 56.3 %
Platelets: 306 10*3/uL (ref 140–400)
RBC: 4.37 10*6/uL (ref 3.80–5.10)
RDW: 12.6 % (ref 11.0–15.0)
Total Lymphocyte: 35.3 %
WBC: 7.7 10*3/uL (ref 3.8–10.8)

## 2020-08-27 LAB — COMPLETE METABOLIC PANEL WITH GFR
AG Ratio: 2 (calc) (ref 1.0–2.5)
ALT: 9 U/L (ref 6–29)
AST: 14 U/L (ref 10–35)
Albumin: 4.2 g/dL (ref 3.6–5.1)
Alkaline phosphatase (APISO): 37 U/L (ref 37–153)
BUN/Creatinine Ratio: 23 (calc) — ABNORMAL HIGH (ref 6–22)
BUN: 26 mg/dL — ABNORMAL HIGH (ref 7–25)
CO2: 24 mmol/L (ref 20–32)
Calcium: 9.7 mg/dL (ref 8.6–10.4)
Chloride: 102 mmol/L (ref 98–110)
Creat: 1.13 mg/dL — ABNORMAL HIGH (ref 0.60–0.93)
GFR, Est African American: 56 mL/min/{1.73_m2} — ABNORMAL LOW (ref >=60)
GFR, Est Non African American: 48 mL/min/{1.73_m2} — ABNORMAL LOW (ref >=60)
Globulin: 2.1 g/dL (calc) (ref 1.9–3.7)
Glucose, Bld: 143 mg/dL — ABNORMAL HIGH (ref 65–99)
Potassium: 5.1 mmol/L (ref 3.5–5.3)
Sodium: 138 mmol/L (ref 135–146)
Total Bilirubin: 0.4 mg/dL (ref 0.2–1.2)
Total Protein: 6.3 g/dL (ref 6.1–8.1)

## 2020-08-27 LAB — HEPATITIS C ANTIBODY
Hepatitis C Ab: NONREACTIVE
SIGNAL TO CUT-OFF: 0 (ref <1.00)

## 2020-08-27 LAB — LIPID PANEL
Cholesterol: 111 mg/dL (ref <200)
HDL: 49 mg/dL — ABNORMAL LOW (ref >=50)
LDL Cholesterol (Calc): 39 mg/dL (calc)
Non-HDL Cholesterol (Calc): 62 mg/dL (calc) (ref <130)
Total CHOL/HDL Ratio: 2.3 (calc) (ref <5.0)
Triglycerides: 147 mg/dL (ref <150)

## 2020-08-27 LAB — HEMOGLOBIN A1C
Hgb A1c MFr Bld: 7.9 % of total Hgb — ABNORMAL HIGH (ref <5.7)
Mean Plasma Glucose: 180 mg/dL
eAG (mmol/L): 10 mmol/L

## 2020-08-28 ENCOUNTER — Other Ambulatory Visit: Payer: Self-pay

## 2020-08-29 ENCOUNTER — Other Ambulatory Visit: Payer: Self-pay

## 2020-08-29 ENCOUNTER — Ambulatory Visit (INDEPENDENT_AMBULATORY_CARE_PROVIDER_SITE_OTHER): Payer: Medicare Other | Admitting: Nurse Practitioner

## 2020-08-29 ENCOUNTER — Encounter: Payer: Self-pay | Admitting: Nurse Practitioner

## 2020-08-29 ENCOUNTER — Telehealth: Payer: Self-pay

## 2020-08-29 VITALS — BP 128/80 | HR 60 | Temp 97.2°F | Ht 64.0 in | Wt 205.0 lb

## 2020-08-29 DIAGNOSIS — E1169 Type 2 diabetes mellitus with other specified complication: Secondary | ICD-10-CM

## 2020-08-29 DIAGNOSIS — D692 Other nonthrombocytopenic purpura: Secondary | ICD-10-CM | POA: Insufficient documentation

## 2020-08-29 MED ORDER — HYDROCODONE-ACETAMINOPHEN 7.5-325 MG PO TABS: ORAL_TABLET | ORAL | 0 refills | Status: DC

## 2020-08-29 NOTE — Assessment & Plan Note (Addendum)
Chronic.  Recent A1c is 7.9%.  Unfortunately, still unable to clear patient for surgery as orthopedic provider requires A1c less than 7.5.  We will continue on Ozempic - waiting on patient assistance program to approve Ozempic.  In meantime, will give samples of Trulicity until Ozempic is approved.  We will recheck A1c in 1 month in hopes that it will be less than 7.5 and she can be cleared for surgery.  Foot exam normal today.  Patient is to schedule eye exam.  Follow up 1 month.

## 2020-08-29 NOTE — Telephone Encounter (Signed)
Received pre op form for pt, form is located in file drawer upon pt return

## 2020-08-29 NOTE — Progress Notes (Signed)
Subjective:    Patient ID: Sonya Solomon, female    DOB: 04-Aug-1945, 75 y.o.   MRN: [id number]  HPI: Sonya Solomon is a 75 y.o. female presenting for diabetes recheck for pre-operative clearance.  Chief Complaint  Patient presents with  . Pre-op Exam   DIABETES Patient is currently taking Ozempic, Metformin, and Actos for diabetes.  Recent A1c was 7.9% which is improved from 8.9% 2 months ago.   Hypoglycemic episodes:no Polydipsia/polyuria: no Visual disturbance: no Chest pain: no Paresthesias: no Glucose Monitoring: no Taking Insulin?: no Blood Pressure Monitoring: not checking Retinal Examination: Not up to Date Foot Exam: Up to Date Diabetic Education: Completed Pneumovax: Up to Date Influenza: Up to Date Aspirin: no   Allergies  Allergen Reactions  . Latex Rash    Outpatient Encounter Medications as of 08/29/2020  Medication Sig  . ACCU-CHEK GUIDE test strip USE TO MONITOR FASTING BLOOD SUGAR TWICE DAILY  . Blood Glucose Monitoring Suppl (BLOOD GLUCOSE SYSTEM PAK) KIT Please dispense as AccuChek. Use as directed to monitor FSBS 2x daily. Dx: E11.9  . HYDROcodone-acetaminophen (NORCO) 7.5-325 MG tablet 1/2 to 1 pill po bid  . interferon beta-1a (AVONEX) 30 MCG/0.5ML PSKT injection Inject 30 mcg into the muscle every 7 (seven) days.  Marland Kitchen lisinopril (ZESTRIL) 10 MG tablet Take 1 tablet (10 mg total) by mouth daily.  Marland Kitchen loperamide (IMODIUM) 2 MG capsule Take 1 capsule (2 mg total) by mouth as needed for diarrhea or loose stools.  . metFORMIN (GLUCOPHAGE) 1000 MG tablet Take 1 tablet (1,000 mg total) by mouth 2 (two) times daily with a meal.  . Multiple Vitamin (MULTIVITAMIN) capsule Take 1 capsule by mouth daily.  . pioglitazone (ACTOS) 45 MG tablet Take 1 tablet (45 mg total) by mouth daily.  . Semaglutide, 1 MG/DOSE, (OZEMPIC, 1 MG/DOSE,) 2 MG/1.5ML SOPN Inject 1 mg into the skin once a week.  . simvastatin (ZOCOR) 40 MG tablet Take 1 tablet (40 mg total) by mouth daily.   . solifenacin (VESICARE) 10 MG tablet Take 1 tablet (10 mg total) by mouth daily.  . [DISCONTINUED] HYDROcodone-acetaminophen (NORCO) 7.5-325 MG tablet 1/2 to 1 pill po bid  . [DISCONTINUED] Semaglutide,0.25 or 0.5MG/DOS, 2 MG/1.5ML SOPN Inject 0.25 mg into the skin once a week for 28 days, THEN 0.5 mg once a week for 28 days, THEN 1 mg once a week for 28 days. Start 0.25 mg once weekly.   No facility-administered encounter medications on file as of 08/29/2020.    Patient Active Problem List   Diagnosis Date Noted  . Senile purpura ([place]) 08/29/2020  . Vertigo 03/26/2020  . Diplopia 03/26/2020  . Gait disturbance 03/26/2020  . Urinary urgency 03/26/2020  . MS (multiple sclerosis) ([place]) 12/13/2019  . Hyperlipidemia associated with type 2 diabetes mellitus ([place]) 12/13/2019  . Type 2 diabetes mellitus with other specified complication ([place]) 16/57/9038  . Abnormal electrocardiogram 03/09/2011    Past Medical History:  Diagnosis Date  . Diabetes mellitus without complication ([place])   . MS (multiple sclerosis) ([place])     Relevant past medical, surgical, family and social history reviewed and updated as indicated. Interim medical history since our last visit reviewed.  Review of Systems Per HPI unless specifically indicated above     Objective:    BP 128/80   Pulse 60   Temp (!) 97.2 F (36.2 C)   Ht _0  (1.626 m)   Wt 205 lb (93 kg)   SpO2 97%   BMI 35.19  kg/m   Wt Readings from Last 3 Encounters:  08/29/20 205 lb (93 kg)  06/28/20 216 lb (98 kg)  03/26/20 210 lb 8 oz (95.5 kg)    Physical Exam Vitals and nursing note reviewed.  Constitutional:      General: She is not in acute distress.    Appearance: Normal appearance. She is obese. She is not toxic-appearing.  Eyes:     General: No scleral icterus.    Extraocular Movements: Extraocular movements intact.  Cardiovascular:     Rate and Rhythm: Normal rate and regular rhythm.  Pulmonary:     Effort: Pulmonary  effort is normal. No respiratory distress.     Breath sounds: Normal breath sounds. No wheezing, rhonchi or rales.  Abdominal:     General: Abdomen is flat. Bowel sounds are normal.  Musculoskeletal:     Right lower leg: No edema.     Left lower leg: No edema.     Comments: Using cane for ambulation  Skin:    General: Skin is warm and dry.     Capillary Refill: Capillary refill takes less than 2 seconds.     Coloration: Skin is not jaundiced or pale.     Findings: No erythema.  Neurological:     Mental Status: She is alert and oriented to person, place, and time.     Gait: Gait abnormal.  Psychiatric:        Mood and Affect: Mood normal.        Behavior: Behavior normal.        Thought Content: Thought content normal.        Judgment: Judgment normal.    Results for orders placed or performed in visit on 08/26/20  Hepatitis C antibody  Result Value Ref Range   Hepatitis C Ab NON-REACTIVE NON-REACTIVE   SIGNAL TO CUT-OFF 0.00 <1.00  Lipid Panel  Result Value Ref Range   Cholesterol 111 <200 mg/dL   HDL 49 (L) > OR = 50 mg/dL   Triglycerides 147 <150 mg/dL   LDL Cholesterol (Calc) 39 mg/dL (calc)   Total CHOL/HDL Ratio 2.3 <5.0 (calc)   Non-HDL Cholesterol (Calc) 62 <130 mg/dL (calc)  Hemoglobin A1c  Result Value Ref Range   Hgb A1c MFr Bld 7.9 (H) <5.7 % of total Hgb   Mean Plasma Glucose 180 mg/dL   eAG (mmol/L) 10.0 mmol/L  COMPLETE METABOLIC PANEL WITH GFR  Result Value Ref Range   Glucose, Bld 143 (H) 65 - 99 mg/dL   BUN 26 (H) 7 - 25 mg/dL   Creat 1.13 (H) 0.60 - 0.93 mg/dL   GFR, Est Non African American 48 (L) > OR = 60 mL/min/1.21m   GFR, Est African American 56 (L) > OR = 60 mL/min/1.76m  BUN/Creatinine Ratio 23 (H) 6 - 22 (calc)   Sodium 138 135 - 146 mmol/L   Potassium 5.1 3.5 - 5.3 mmol/L   Chloride 102 98 - 110 mmol/L   CO2 24 20 - 32 mmol/L   Calcium 9.7 8.6 - 10.4 mg/dL   Total Protein 6.3 6.1 - 8.1 g/dL   Albumin 4.2 3.6 - 5.1 g/dL   Globulin  2.1 1.9 - 3.7 g/dL (calc)   AG Ratio 2.0 1.0 - 2.5 (calc)   Total Bilirubin 0.4 0.2 - 1.2 mg/dL   Alkaline phosphatase (APISO) 37 37 - 153 U/L   AST 14 10 - 35 U/L   ALT 9 6 - 29 U/L  CBC with Differential  Result Value Ref Range   WBC 7.7 3.8 - 10.8 Thousand/uL   RBC 4.37 3.80 - 5.10 Million/uL   Hemoglobin 13.1 11.7 - 15.5 g/dL   HCT 41.0 35.0 - 45.0 %   MCV 93.8 80.0 - 100.0 fL   MCH 30.0 27.0 - 33.0 pg   MCHC 32.0 32.0 - 36.0 g/dL   RDW 12.6 11.0 - 15.0 %   Platelets 306 140 - 400 Thousand/uL   MPV 8.9 7.5 - 12.5 fL   Neutro Abs 4,335 1,500 - 7,800 cells/uL   Lymphs Abs 2,718 850 - 3,900 cells/uL   Absolute Monocytes 439 200 - 950 cells/uL   Eosinophils Absolute 169 15 - 500 cells/uL   Basophils Absolute 39 0 - 200 cells/uL   Neutrophils Relative % 56.3 %   Total Lymphocyte 35.3 %   Monocytes Relative 5.7 %   Eosinophils Relative 2.2 %   Basophils Relative 0.5 %      Assessment & Plan:   Problem List Items Addressed This Visit      Endocrine   Type 2 diabetes mellitus with other specified complication ([place]) - Primary    Chronic.  Recent A1c is 7.9%.  Unfortunately, still unable to clear patient for surgery as orthopedic provider requires A1c less than 7.5.  We will continue on Ozempic - waiting on patient assistance program to approve Ozempic.  In meantime, will give samples of Trulicity until Ozempic is approved.  We will recheck A1c in 1 month in hopes that it will be less than 7.5 and she can be cleared for surgery.  Foot exam normal today.  Patient is to schedule eye exam.  Follow up 1 month.      Relevant Orders   Hemoglobin A1c       Follow up plan: Return in about 4 weeks (around 09/26/2020) for diabetes check, pre-op clearance.

## 2020-08-29 NOTE — Patient Instructions (Signed)
Diabetes Mellitus Action Plan Following a diabetes action plan is a way for you to manage your diabetes (diabetes mellitus) symptoms. The plan is color-coded to help you understand what actions you need to take based on any symptoms you are having.  If you have symptoms in the red zone, you need medical care right away.  If you have symptoms in the yellow zone, you are having problems.  If you have symptoms in the green zone, you are doing well. Learning about and understanding diabetes can take time. Follow the plan that you develop with your health care provider. Know the target range for your blood sugar (glucose) level, and review your treatment plan with your health care provider at each visit. The target range for my blood sugar level is __________________________ mg/dL. Red zone Get medical help right away if you have any of the following symptoms:  A blood sugar test result that is below 54 mg/dL (3 mmol/L).  A blood sugar test result that is at or above 240 mg/dL (13.3 mmol/L) for 2 days in a row.  Confusion or trouble thinking clearly.  Difficulty breathing.  Sickness or a fever for 2 or more days that is not getting better.  Moderate or large ketone levels in your urine.  Feeling tired or having no energy. If you have any red zone symptoms, do not wait to see if the symptoms will go away. Get medical help right away. Call your local emergency services (911 in the [place] Do not drive yourself to the hospital. If you have severely low blood sugar (severe hypoglycemia) and you cannot eat or drink, you may need glucagon. Make sure a family member or close friend knows how to check your blood sugar and how to give you glucagon. You may need to be treated in a hospital for this condition.   Yellow zone If you have any of the following symptoms, your diabetes is not under control and you may need to make some changes:  A blood sugar test result that is at or above 240 mg/dL (13.3  mmol/L) for 2 days in a row.  Blood sugar test results that are below 70 mg/dL (3.9 mmol/L).  Other symptoms of hypoglycemia, such as: ? Shaking or feeling light-headed. ? Confusion or irritability. ? Feeling hungry. ? Having a fast heartbeat. If you have any yellow zone symptoms:  Treat your hypoglycemia by eating or drinking 15 grams of a rapid-acting carbohydrate. Follow the 15:15 rule: ? Take 15 grams of a rapid-acting carbohydrate, such as:  1 tube of glucose gel.  4 glucose pills.  4 oz (120 mL) of fruit juice.  4 oz (120 mL) of regular (not diet) soda. ? Check your blood sugar 15 minutes after you take the carbohydrate. ? If the repeat blood sugar test is still at or below 70 mg/dL (3.9 mmol/L), take 15 grams of a carbohydrate again. ? If your blood sugar does not increase above 70 mg/dL (3.9 mmol/L) after 3 tries, get medical help right away. ? After your blood sugar returns to normal, eat a meal or a snack within 1 hour.  Keep taking your daily medicines as told by your health care provider.  Check your blood sugar more often than you normally would. ? Write down your results. ? Call your health care provider if you have trouble keeping your blood sugar in your target range.   Green zone These signs mean you are doing well and you can continue what you   are doing to manage your diabetes:  Your blood sugar is within your personal target range. For most people, a blood sugar level before a meal (preprandial) should be 80-130 mg/dL (4.4-7.2 mmol/L).  You feel well, and you are able to do daily activities. If you are in the green zone, continue to manage your diabetes as told by your health care provider. To do this:  Eat a healthy diet.  Exercise regularly.  Check your blood sugar as told by your health care provider.  Take your medicines as told by your health care provider.   Where to find more information  American Diabetes Association (ADA):  diabetes.org  Association of Diabetes Care & Education Specialists (ADCES): diabeteseducator.org Summary  Following a diabetes action plan is a way for you to manage your diabetes symptoms. The plan is color-coded to help you understand what actions you need to take based on any symptoms you are having.  Follow the plan that you develop with your health care provider. Make sure you know your personal target blood sugar level.  Review your treatment plan with your health care provider at each visit. This information is not intended to replace advice given to you by your health care provider. Make sure you discuss any questions you have with your health care provider. Document Revised: 10/26/2019 Document Reviewed: 10/26/2019 Elsevier Patient Education  2021 Elsevier Inc.  

## 2020-09-04 ENCOUNTER — Encounter: Payer: Self-pay | Admitting: Nurse Practitioner

## 2020-09-04 DIAGNOSIS — E785 Hyperlipidemia, unspecified: Secondary | ICD-10-CM

## 2020-09-04 DIAGNOSIS — E1169 Type 2 diabetes mellitus with other specified complication: Secondary | ICD-10-CM

## 2020-09-04 MED ORDER — SIMVASTATIN 40 MG PO TABS: 40.0000 mg | ORAL_TABLET | Freq: Every day | ORAL | 1 refills | Status: DC

## 2020-09-12 LAB — HM DIABETES EYE EXAM

## 2020-09-16 ENCOUNTER — Ambulatory Visit: Payer: Medicare Other

## 2020-09-20 ENCOUNTER — Telehealth: Payer: Self-pay

## 2020-09-20 NOTE — Telephone Encounter (Signed)
called pt to inform that medication has been delivred to the office. Pt will be in today to pick up, understands med must be refrigerated

## 2020-09-21 ENCOUNTER — Ambulatory Visit
Admission: RE | Admit: 2020-09-21 | Discharge: 2020-09-21 | Disposition: A | Discharge status: Home / Self Care | Payer: Medicare Other | Source: Ambulatory Visit | Attending: Family Medicine | Admitting: Family Medicine

## 2020-09-21 ENCOUNTER — Other Ambulatory Visit: Payer: Self-pay

## 2020-09-21 DIAGNOSIS — Z0000 Encounter for general adult medical examination without abnormal findings: Secondary | ICD-10-CM

## 2020-09-25 ENCOUNTER — Other Ambulatory Visit: Payer: Self-pay

## 2020-09-25 ENCOUNTER — Other Ambulatory Visit: Payer: Self-pay | Admitting: Nurse Practitioner

## 2020-09-25 MED ORDER — HYDROCODONE-ACETAMINOPHEN 7.5-325 MG PO TABS: ORAL_TABLET | ORAL | 0 refills | Status: DC

## 2020-09-26 ENCOUNTER — Other Ambulatory Visit: Payer: Self-pay | Admitting: Family Medicine

## 2020-09-26 ENCOUNTER — Ambulatory Visit: Payer: Medicare Other | Admitting: Nurse Practitioner

## 2020-09-26 DIAGNOSIS — I10 Essential (primary) hypertension: Secondary | ICD-10-CM

## 2020-10-01 ENCOUNTER — Ambulatory Visit: Payer: Medicare Other | Admitting: Neurology

## 2020-10-03 ENCOUNTER — Other Ambulatory Visit: Payer: Medicare Other

## 2020-10-03 ENCOUNTER — Other Ambulatory Visit: Payer: Self-pay

## 2020-10-03 ENCOUNTER — Ambulatory Visit (INDEPENDENT_AMBULATORY_CARE_PROVIDER_SITE_OTHER): Payer: Medicare Other | Admitting: Nurse Practitioner

## 2020-10-03 DIAGNOSIS — E1169 Type 2 diabetes mellitus with other specified complication: Secondary | ICD-10-CM

## 2020-10-04 LAB — HEMOGLOBIN A1C
Hgb A1c MFr Bld: 7.2 % of total Hgb — ABNORMAL HIGH (ref <5.7)
Mean Plasma Glucose: 160 mg/dL
eAG (mmol/L): 8.9 mmol/L

## 2020-10-08 NOTE — Progress Notes (Signed)
Lm for pt to cb. Form placed in pcp folder

## 2020-10-09 ENCOUNTER — Encounter: Payer: Self-pay | Admitting: Nurse Practitioner

## 2020-10-25 ENCOUNTER — Other Ambulatory Visit: Payer: Self-pay

## 2020-10-28 ENCOUNTER — Telehealth: Payer: Self-pay | Admitting: *Deleted

## 2020-10-28 MED ORDER — HYDROCODONE-ACETAMINOPHEN 7.5-325 MG PO TABS: ORAL_TABLET | ORAL | 0 refills | Status: DC

## 2020-10-28 NOTE — Telephone Encounter (Signed)
Faxed completed/signed surgical clearance form back to [employer]. Pt cleared neurologically for R knee scope. Date of surgery pending. Fax: [phone], Jethro Bolus. Received fax confirmation.

## 2020-11-27 ENCOUNTER — Other Ambulatory Visit: Payer: Self-pay

## 2020-11-27 MED ORDER — HYDROCODONE-ACETAMINOPHEN 7.5-325 MG PO TABS: ORAL_TABLET | ORAL | 0 refills | Status: DC

## 2020-12-02 ENCOUNTER — Encounter: Payer: Self-pay | Admitting: Nurse Practitioner

## 2020-12-05 MED ORDER — BLOOD GLUCOSE TEST VI STRP: ORAL_STRIP | 3 refills | Status: DC

## 2020-12-10 ENCOUNTER — Telehealth: Payer: Self-pay

## 2020-12-10 NOTE — Telephone Encounter (Signed)
Called pt to inform medication is at office for pick up. Ozempic inj from [employer].

## 2020-12-24 ENCOUNTER — Telehealth: Payer: Self-pay | Admitting: Neurology

## 2020-12-24 NOTE — Telephone Encounter (Signed)
Called back and spoke w/ pharmacist, Lorin Picket. Wanted VO to change rx from prefilled syringes to pens. I provided VO. Nothing further needed. They will process script for pt.

## 2020-12-24 NOTE — Telephone Encounter (Signed)
Lori from [employer] called needing to speak with an RN regarding a change in pt's interferon beta-1a (AVONEX) 30 MCG/0.5ML PSKT injection

## 2020-12-25 NOTE — Telephone Encounter (Signed)
Please send in more strips if needed; not sure why she cannot pick up strips.

## 2020-12-30 ENCOUNTER — Other Ambulatory Visit: Payer: Self-pay

## 2020-12-31 MED ORDER — HYDROCODONE-ACETAMINOPHEN 7.5-325 MG PO TABS: ORAL_TABLET | ORAL | 0 refills | Status: DC

## 2021-01-15 ENCOUNTER — Ambulatory Visit (INDEPENDENT_AMBULATORY_CARE_PROVIDER_SITE_OTHER): Payer: Medicare Other | Admitting: Neurology

## 2021-01-15 ENCOUNTER — Encounter: Payer: Self-pay | Admitting: Neurology

## 2021-01-15 VITALS — BP 122/77 | HR 57 | Ht 64.5 in | Wt 183.0 lb

## 2021-01-15 DIAGNOSIS — R269 Unspecified abnormalities of gait and mobility: Secondary | ICD-10-CM

## 2021-01-15 DIAGNOSIS — G35 Multiple sclerosis: Secondary | ICD-10-CM

## 2021-01-15 DIAGNOSIS — R42 Dizziness and giddiness: Secondary | ICD-10-CM

## 2021-01-15 DIAGNOSIS — R3915 Urgency of urination: Secondary | ICD-10-CM

## 2021-01-15 MED ORDER — DESMOPRESSIN ACETATE 0.1 MG PO TABS: 0.1000 mg | ORAL_TABLET | Freq: Every day | ORAL | 3 refills | Status: DC

## 2021-01-15 NOTE — Progress Notes (Signed)
[facility]  PATIENT: Sonya Solomon DOB: [birth year]  REFERRING DOCTOR OR PCP: Dr. Buelah Manis SOURCE: Patient, notes from [facility] neurology, laboratory reports  _________________________________   HISTORICAL  CHIEF COMPLAINT:  Chief Complaint  Patient presents with   Follow-up    Rm 1,alone. Here for 6 month MS f/u, on Avonex. Pt reports doing well.     HISTORY OF PRESENT ILLNESS:  Sonya Solomon is a 75 y.o. woman with multiple sclerosis diagnosed in 2000  Update 01/15/2021 She feels her MS is stable .  She is on Avonex and tolerates it well.  She notes balance is reduced and she uses a cane.   She is able to use stairs carefully.  When she first stands, she has vertigo and stays still a minute before walking.   She notes mild bilateral leg weakness.    She has no numbness but gets stabbing pain in both legs.   The pain radiates intot the lower legs and right foot but not left foot. In the right foot the pain radiates into the second toe but not all of the toes.  She denies lower back pain.   She takes hydrocodone..    She has urinary urgency and frequency, worse at night.   Solifenacin helps a bit.       She feels her vision is doing ok.   She had bilateral cataract surgery in 2020.     She notes fatigue but it does not prevent her from accomplishing tasks.   She wake sup several times at night with nocturia but quickly falls back asleep.   Mood is doing well.  Cognition is doing well   MS History:   She was diagnosed with MS in 2000 after presenting with diplopia and vertigo.  An MRI was consistent with MS and she was diagnosed with relapsing remitting MS.   She received several days of IV steroid and was placed on Avonex.   She gets intermittent vertigo but has not had ant definite exacerbation.     She has stayed on Avonex and feels stable.  She takes it every other week and tolerates it well.  She was diagnosed while living in [place] but moved shortly thereafter  to [facility] and has been seen Janeth Rase most of that time  IMAGING:   MRI brain report from 11/16/2010 states that there were 3 new small foci of plaque enhancement in the right cerebral hemisphere and resolution of enhancement of a plaque in the right middle cerebellar peduncle that had been seen on a previous MRI (date unknown).  Additionally there are multiple chronic foci in the supratentorial and infratentorial white matter consistent with MS..      MRI brain 04/20/2020 showed discrete T2/flair hyperintense foci in the spinal cord adjacent to C2, left middle cerebellar peduncle, right medial cerebellar hemisphere.  There are single and confluent foci in the periventricular white matter of the hemispheres and discrete foci in the juxtacortical and deep white matter of the hemispheres.  None of the foci appear to be acute.  I compared the brain MRI from 11/06/2010 to the recent one 04/20/2020 --- she does show significant progression over that time.   MRI in 2012 showed 3 enhancing lesions  . At her request 2021 MRI was without contrast.  Vitamin D was normal (52) in 2017     REVIEW OF SYSTEMS: Constitutional: No fevers, chills, sweats, or change in appetite Eyes: No visual changes, double vision, eye pain Ear, nose  and throat: No hearing loss, ear pain, nasal congestion, sore throat Cardiovascular: No chest pain, palpitations Respiratory:  No shortness of breath at rest or with exertion.   No wheezes GastrointestinaI: No nausea, vomiting, diarrhea, abdominal pain, fecal incontinence Genitourinary:  No dysuria, urinary retention or frequency.  No nocturia. Musculoskeletal:  No neck pain, back pain Integumentary: No rash, pruritus, skin lesions Neurological: as above Psychiatric: No depression at this time.  No anxiety Endocrine: No palpitations, diaphoresis, change in appetite, change in weigh or increased thirst Hematologic/Lymphatic:  No anemia, purpura,  petechiae. Allergic/Immunologic: No itchy/runny eyes, nasal congestion, recent allergic reactions, rashes  ALLERGIES: Allergies  Allergen Reactions   Latex Rash    HOME MEDICATIONS:  Current Outpatient Medications:    ACCU-CHEK GUIDE test strip, USE TO MONITOR FASTING BLOOD SUGAR TWICE DAILY, Disp: 100 strip, Rfl: 3   Blood Glucose Monitoring Suppl (BLOOD GLUCOSE SYSTEM PAK) KIT, Please dispense as AccuChek. Use as directed to monitor FSBS 2x daily. Dx: E11.9, Disp: 1 kit, Rfl: 1   desmopressin (DDAVP) 0.1 MG tablet, Take 1 tablet (0.1 mg total) by mouth daily., Disp: 90 tablet, Rfl: 3   esomeprazole (NEXIUM) 20 MG capsule, Take 1 capsule by mouth daily., Disp: , Rfl:    Glucose Blood (BLOOD GLUCOSE TEST STRIPS) STRP, Please dispense based on patient and insurance preference. Use as directed to monitor FSBS 2x daily for fluctuating blood glucose. Dx: E11.65., Disp: 200 strip, Rfl: 3   HYDROcodone-acetaminophen (NORCO) 7.5-325 MG tablet, Take 1/2 to 1 pill by mouth twice daily. Must keep follow up 01/15/21 for ongoing refills, Disp: 45 tablet, Rfl: 0   interferon beta-1a (AVONEX) 30 MCG/0.5ML PSKT injection, Inject 30 mcg into the muscle every 7 (seven) days., Disp: , Rfl:    lisinopril (ZESTRIL) 10 MG tablet, TAKE 1 TABLET(10 MG) BY MOUTH DAILY, Disp: 90 tablet, Rfl: 1   loperamide (IMODIUM) 2 MG capsule, Take 1 capsule (2 mg total) by mouth as needed for diarrhea or loose stools., Disp: 30 capsule, Rfl: 3   metFORMIN (GLUCOPHAGE) 1000 MG tablet, Take 1 tablet (1,000 mg total) by mouth 2 (two) times daily with a meal., Disp: 180 tablet, Rfl: 3   Multiple Vitamin (MULTIVITAMIN) capsule, Take 1 capsule by mouth daily., Disp: , Rfl:    pioglitazone (ACTOS) 45 MG tablet, Take 1 tablet (45 mg total) by mouth daily., Disp: 30 tablet, Rfl: 3   Semaglutide, 1 MG/DOSE, (OZEMPIC, 1 MG/DOSE,) 2 MG/1.5ML SOPN, Inject 1 mg into the skin once a week., Disp: 4.5 mL, Rfl: 3   simvastatin (ZOCOR) 40 MG  tablet, Take 1 tablet (40 mg total) by mouth daily., Disp: 90 tablet, Rfl: 1   solifenacin (VESICARE) 10 MG tablet, Take 1 tablet (10 mg total) by mouth daily., Disp: 30 tablet, Rfl: 11  PAST MEDICAL HISTORY: Past Medical History:  Diagnosis Date   Diabetes mellitus without complication ([place])    MS (multiple sclerosis) ([place])     PAST SURGICAL HISTORY: Past Surgical History:  Procedure Laterality Date   ABDOMINAL HYSTERECTOMY     APPENDECTOMY     BOWEL RESECTION  1996   BREAST REDUCTION SURGERY  07/10/1986   CATARACT EXTRACTION, BILATERAL     12/21/19, 09/19.21   CHOLECYSTECTOMY  05/2004   DE QUERVAIN'S RELEASE  05/18/2014   RADIAL STYLOIDECTOMY WRIST Left 05/10/2012   REDUCTION MAMMAPLASTY     TRIGGER FINGER RELEASE Left 09/32/3557   UMBILICAL HERNIA REPAIR  08/10/2006    FAMILY HISTORY: Family History  Problem  Relation Age of Onset   Breast cancer Mother    Heart attack Father     SOCIAL HISTORY:  Social History   Socioeconomic History   Marital status: Married    Spouse name: Not on file   Number of children: Not on file   Years of education: Not on file   Highest education level: Not on file  Occupational History   Not on file  Tobacco Use   Smoking status: Never   Smokeless tobacco: Never  Substance and Sexual Activity   Alcohol use: Never   Drug use: Never   Sexual activity: Not Currently  Other Topics Concern   Not on file  Social History Narrative   Lives with husband   Caffeine use: 2 cups per day   Right handed    Social Determinants of Health   Financial Resource Strain: Not on file  Food Insecurity: Not on file  Transportation Needs: Not on file  Physical Activity: Not on file  Stress: Not on file  Social Connections: Not on file  Intimate Partner Violence: Not on file     PHYSICAL EXAM  Vitals:   01/15/21 1258  BP: 122/77  Pulse: (!) 57  Weight: 183 lb (83 kg)  Height: 5' 4.5" (1.638 m)    Body mass index is 30.93  kg/m.   General: The patient is well-developed and well-nourished and in no acute distress  HEENT:  Head is [place]/AT.  Sclera are anicteric.    Skin: Extremities are without rash or  edema.  Neurologic Exam  Mental status: The patient is alert and oriented x 3 at the time of the examination. The patient has apparent normal recent and remote memory, with an apparently normal attention span and concentration ability.   Speech is normal.  Cranial nerves: Extraocular movements are full.  Facial strength and sensation was normal.  No obvious hearing deficits are noted.  Motor:  Muscle bulk is normal.   Tone is normal. Strength is  5 / 5 in all 4 extremities.   Sensory: Sensory testing is intact to pinprick, soft touch and vibration sensation in all 4 extremities.  Coordination: Cerebellar testing reveals good finger-nose-finger and heel-to-shin bilaterally.  Gait and station: Station is normal.   Gait is wide but she can walk without a cane.  Tandem gait is poor.  Romberg is negative.   Reflexes: Deep tendon reflexes are symmetric and normal bilaterally.        DIAGNOSTIC DATA (LABS, IMAGING, TESTING) - I reviewed patient records, labs, notes, testing and imaging myself where available.  Lab Results  Component Value Date   WBC 7.7 08/26/2020   HGB 13.1 08/26/2020   HCT 41.0 08/26/2020   MCV 93.8 08/26/2020   PLT 306 08/26/2020      Component Value Date/Time   NA 138 08/26/2020 1148   K 5.1 08/26/2020 1148   CL 102 08/26/2020 1148   CO2 24 08/26/2020 1148   GLUCOSE 143 (H) 08/26/2020 1148   BUN 26 (H) 08/26/2020 1148   CREATININE 1.13 (H) 08/26/2020 1148   CALCIUM 9.7 08/26/2020 1148   PROT 6.3 08/26/2020 1148   AST 14 08/26/2020 1148   ALT 9 08/26/2020 1148   BILITOT 0.4 08/26/2020 1148   GFRNONAA 48 (L) 08/26/2020 1148   GFRAA 56 (L) 08/26/2020 1148   Lab Results  Component Value Date   CHOL 111 08/26/2020   HDL 49 (L) 08/26/2020   LDLCALC 39 08/26/2020   TRIG  147  08/26/2020   CHOLHDL 2.3 08/26/2020   Lab Results  Component Value Date   HGBA1C 7.2 (H) 10/03/2020       ASSESSMENT AND PLAN  MS (multiple sclerosis) (HCC)  Vertigo  Gait disturbance  Urinary urgency  1    She wishes to remain on Avonex.  We disucssed options as MRI showed progression.    2.   DDAVP for nocturna urinary urgency/frequency (if too expensive at [facility] we can send to [facility] or other store with GoodRx).     If not better consider Myrbetriq 3.   Rtc 1 year.   We will check an MRI of the brain aroud that time  Kalla Watson A. Felecia Shelling, MD, [facility] 0/12/6759, 9:50 PM Certified in Neurology, Clinical Neurophysiology, Sleep Medicine and Neuroimaging  [facility] [address] [place], [place] [address] [phone]

## 2021-01-19 ENCOUNTER — Encounter: Payer: Self-pay | Admitting: Nurse Practitioner

## 2021-01-20 ENCOUNTER — Other Ambulatory Visit: Payer: Self-pay

## 2021-01-20 DIAGNOSIS — E1169 Type 2 diabetes mellitus with other specified complication: Secondary | ICD-10-CM

## 2021-01-20 MED ORDER — METFORMIN HCL 1000 MG PO TABS: 1000.0000 mg | ORAL_TABLET | Freq: Two times a day (BID) | ORAL | 0 refills | Status: DC

## 2021-01-26 ENCOUNTER — Encounter: Payer: Self-pay | Admitting: Nurse Practitioner

## 2021-01-26 DIAGNOSIS — E1169 Type 2 diabetes mellitus with other specified complication: Secondary | ICD-10-CM

## 2021-01-27 MED ORDER — PIOGLITAZONE HCL 45 MG PO TABS: 45.0000 mg | ORAL_TABLET | Freq: Every day | ORAL | 3 refills | Status: DC

## 2021-01-29 ENCOUNTER — Other Ambulatory Visit: Payer: Self-pay

## 2021-01-29 MED ORDER — HYDROCODONE-ACETAMINOPHEN 7.5-325 MG PO TABS: 0.5000 | ORAL_TABLET | Freq: Two times a day (BID) | ORAL | 0 refills | Status: DC | PRN

## 2021-01-29 NOTE — Telephone Encounter (Signed)
Last OV was on 01/15/21.  Next OV is scheduled for 01/15/22 .  Last RX was written on 12/31/20 for 45 tabs.   [place] [facility] Database has been reviewed.

## 2021-02-19 ENCOUNTER — Other Ambulatory Visit: Payer: Self-pay | Admitting: General Surgery

## 2021-02-19 DIAGNOSIS — M7989 Other specified soft tissue disorders: Secondary | ICD-10-CM

## 2021-02-27 ENCOUNTER — Other Ambulatory Visit: Payer: Self-pay

## 2021-02-27 ENCOUNTER — Ambulatory Visit
Admission: RE | Admit: 2021-02-27 | Discharge: 2021-02-27 | Disposition: A | Discharge status: Home / Self Care | Payer: Medicare Other | Source: Ambulatory Visit | Attending: General Surgery | Admitting: General Surgery

## 2021-02-27 ENCOUNTER — Other Ambulatory Visit: Payer: Self-pay | Admitting: *Deleted

## 2021-02-27 ENCOUNTER — Encounter: Payer: Self-pay | Admitting: Nurse Practitioner

## 2021-02-27 DIAGNOSIS — E785 Hyperlipidemia, unspecified: Secondary | ICD-10-CM

## 2021-02-27 DIAGNOSIS — M7989 Other specified soft tissue disorders: Secondary | ICD-10-CM

## 2021-02-27 DIAGNOSIS — E1169 Type 2 diabetes mellitus with other specified complication: Secondary | ICD-10-CM

## 2021-02-27 MED ORDER — HYDROCODONE-ACETAMINOPHEN 7.5-325 MG PO TABS: 0.5000 | ORAL_TABLET | Freq: Two times a day (BID) | ORAL | 0 refills | Status: DC | PRN

## 2021-02-28 MED ORDER — SIMVASTATIN 40 MG PO TABS: 40.0000 mg | ORAL_TABLET | Freq: Every day | ORAL | 1 refills | Status: DC

## 2021-03-06 ENCOUNTER — Other Ambulatory Visit: Payer: Self-pay | Admitting: General Surgery

## 2021-03-06 DIAGNOSIS — M7989 Other specified soft tissue disorders: Secondary | ICD-10-CM

## 2021-03-29 ENCOUNTER — Other Ambulatory Visit: Payer: Medicare Other

## 2021-04-07 ENCOUNTER — Other Ambulatory Visit: Payer: Self-pay | Admitting: Neurology

## 2021-04-07 MED ORDER — HYDROCODONE-ACETAMINOPHEN 7.5-325 MG PO TABS: 0.5000 | ORAL_TABLET | Freq: Two times a day (BID) | ORAL | 0 refills | Status: DC | PRN

## 2021-04-07 NOTE — Telephone Encounter (Signed)
Received refill request for Norco 7.5-325mg .  Last OV was on 01/15/21.  Next OV is scheduled for 01/15/22 .  Last RX was written on 03/06/21 for 45 tabs.   [place] [facility] Database has been reviewed.

## 2021-04-13 ENCOUNTER — Encounter: Payer: Self-pay | Admitting: Neurology

## 2021-04-14 ENCOUNTER — Other Ambulatory Visit: Payer: Self-pay | Admitting: Neurology

## 2021-04-15 ENCOUNTER — Other Ambulatory Visit: Payer: Self-pay | Admitting: Neurology

## 2021-04-15 MED ORDER — DESMOPRESSIN ACETATE 0.2 MG PO TABS: 0.2000 mg | ORAL_TABLET | Freq: Every day | ORAL | 1 refills | Status: DC

## 2021-04-19 ENCOUNTER — Other Ambulatory Visit: Payer: Self-pay

## 2021-04-19 ENCOUNTER — Ambulatory Visit
Admission: RE | Admit: 2021-04-19 | Discharge: 2021-04-19 | Disposition: A | Discharge status: Home / Self Care | Payer: Medicare Other | Source: Ambulatory Visit | Attending: General Surgery | Admitting: General Surgery

## 2021-04-19 DIAGNOSIS — M7989 Other specified soft tissue disorders: Secondary | ICD-10-CM

## 2021-04-19 MED ORDER — GADOBENATE DIMEGLUMINE 529 MG/ML IV SOLN
20.0000 mL | Freq: Once | INTRAVENOUS | Status: AC | PRN
  Administered 2021-04-19: 20 mL via INTRAVENOUS

## 2021-04-28 ENCOUNTER — Other Ambulatory Visit: Payer: Self-pay | Admitting: Family Medicine

## 2021-04-28 DIAGNOSIS — E1169 Type 2 diabetes mellitus with other specified complication: Secondary | ICD-10-CM

## 2021-04-29 ENCOUNTER — Encounter: Payer: Self-pay | Admitting: Nurse Practitioner

## 2021-05-07 ENCOUNTER — Other Ambulatory Visit: Payer: Self-pay | Admitting: Neurology

## 2021-05-07 MED ORDER — HYDROCODONE-ACETAMINOPHEN 7.5-325 MG PO TABS: 0.5000 | ORAL_TABLET | Freq: Two times a day (BID) | ORAL | 0 refills | Status: DC | PRN

## 2021-05-07 NOTE — Telephone Encounter (Signed)
Received refill request for Norco.  Last OV was on 01/15/21.  Next OV is scheduled for 01/15/22 .  Last RX was written on 04/17/21 for 45 tabs.   [place] [facility] Database has been reviewed.

## 2021-05-13 ENCOUNTER — Telehealth: Payer: Self-pay

## 2021-05-13 ENCOUNTER — Other Ambulatory Visit: Payer: Self-pay

## 2021-05-13 ENCOUNTER — Ambulatory Visit (INDEPENDENT_AMBULATORY_CARE_PROVIDER_SITE_OTHER): Payer: Medicare Other | Admitting: Podiatry

## 2021-05-13 DIAGNOSIS — M2041 Other hammer toe(s) (acquired), right foot: Secondary | ICD-10-CM | POA: Diagnosis not present

## 2021-05-13 DIAGNOSIS — L989 Disorder of the skin and subcutaneous tissue, unspecified: Secondary | ICD-10-CM

## 2021-05-13 DIAGNOSIS — M2042 Other hammer toe(s) (acquired), left foot: Secondary | ICD-10-CM

## 2021-05-13 DIAGNOSIS — B353 Tinea pedis: Secondary | ICD-10-CM | POA: Diagnosis not present

## 2021-05-13 NOTE — Telephone Encounter (Signed)
Left message advising patient Sonya Solomon samples are ready for pick up.  They are in the back refrigerator.

## 2021-05-14 ENCOUNTER — Encounter: Payer: Self-pay | Admitting: Nurse Practitioner

## 2021-05-14 ENCOUNTER — Encounter: Payer: Self-pay | Admitting: Podiatry

## 2021-05-14 ENCOUNTER — Other Ambulatory Visit: Payer: Self-pay | Admitting: Podiatry

## 2021-05-14 MED ORDER — ECONAZOLE NITRATE 1 % EX CREA: TOPICAL_CREAM | Freq: Every day | CUTANEOUS | 0 refills | Status: DC

## 2021-05-14 MED ORDER — UREA 40 % EX CREA: 1.0000 "application " | TOPICAL_CREAM | Freq: Every day | CUTANEOUS | 1 refills | Status: DC

## 2021-05-14 NOTE — Telephone Encounter (Signed)
Please advise 

## 2021-05-15 ENCOUNTER — Other Ambulatory Visit: Payer: Self-pay | Admitting: Podiatry

## 2021-05-15 MED ORDER — KETOCONAZOLE 2 % EX CREA: 1.0000 "application " | TOPICAL_CREAM | Freq: Every day | CUTANEOUS | 0 refills | Status: DC

## 2021-05-15 NOTE — Progress Notes (Signed)
Econazole not covered. Sent ketoconazole

## 2021-05-18 NOTE — Progress Notes (Signed)
Subjective:   Patient ID: Sonya Solomon, female   DOB: 76 y.o.   MRN: [id number]   HPI 76 year old female presents the office today with concerns about a knot, callus on the left foot between her third and fourth toes.  She states that she gets a sharp pain to the area.  No open sores or any swelling redness or any drainage.  She does occasionally get athlete's foot and she had a cream previously for her podiatrist which was helpful.  She also uses a cream for the toenail or callus on her left big toe.  She has no other concerns today.   Review of Systems  All other systems reviewed and are negative.  Past Medical History:  Diagnosis Date   Diabetes mellitus without complication (HCC)    MS (multiple sclerosis) (H[facility])     Past Surgical History:  Procedure Laterality Date   ABDOMINAL HYSTERECTOMY     APPENDECTOMY     BOWEL RESECTION  1996   BREAST REDUCTION SURGERY  07/10/1986   CATARACT EXTRACTION, BILATERAL     12/21/19, 09/19.21   CHOLECYSTECTOMY  05/2004   DE QUERVAIN'S RELEASE  05/18/2014   RADIAL STYLOIDECTOMY WRIST Left 05/10/2012   REDUCTION MAMMAPLASTY     TRIGGER FINGER RELEASE Left 01/09/2011   UMBILICAL HERNIA REPAIR  08/10/2006     Current Outpatient Medications:    desmopressin (DDAVP) 0.2 MG tablet, Take 1 tablet (0.2 mg total) by mouth daily., Disp: 90 tablet, Rfl: 1   econazole nitrate 1 % cream, Apply topically daily., Disp: 15 g, Rfl: 0   esomeprazole (NEXIUM) 20 MG capsule, Take 1 capsule by mouth daily., Disp: , Rfl:    HYDROcodone-acetaminophen (NORCO) 7.5-325 MG tablet, Take 0.5-1 tablets by mouth 2 (two) times daily as needed for moderate pain., Disp: 45 tablet, Rfl: 0   interferon beta-1a (AVONEX) 30 MCG/0.5ML PSKT injection, Inject 30 mcg into the muscle every 7 (seven) days., Disp: , Rfl:    ketoconazole (NIZORAL) 2 % cream, Apply 1 application topically daily., Disp: 60 g, Rfl: 0   lisinopril (ZESTRIL) 10 MG tablet, TAKE 1 TABLET(10 MG) BY MOUTH  DAILY, Disp: 90 tablet, Rfl: 1   loperamide (IMODIUM) 2 MG capsule, Take 1 capsule (2 mg total) by mouth as needed for diarrhea or loose stools., Disp: 30 capsule, Rfl: 3   metFORMIN (GLUCOPHAGE) 1000 MG tablet, TAKE 1 TABLET(1000 MG) BY MOUTH TWICE DAILY WITH A MEAL, Disp: 180 tablet, Rfl: 1   Multiple Vitamin (MULTIVITAMIN) capsule, Take 1 capsule by mouth daily., Disp: , Rfl:    pioglitazone (ACTOS) 45 MG tablet, Take 1 tablet (45 mg total) by mouth daily., Disp: 90 tablet, Rfl: 3   Semaglutide, 1 MG/DOSE, (OZEMPIC, 1 MG/DOSE,) 2 MG/1.5ML SOPN, Inject 1 mg into the skin once a week., Disp: 4.5 mL, Rfl: 3   simvastatin (ZOCOR) 40 MG tablet, Take 1 tablet (40 mg total) by mouth daily., Disp: 90 tablet, Rfl: 1   urea (CARMOL) 40 % CREA, Apply 1 application topically daily., Disp: 28 g, Rfl: 1  Allergies  Allergen Reactions   Latex Rash         Objective:  Physical Exam  General: AAO x3, NAD  Dermatological: Small annular hyperkeratotic lesion left foot interdigitally.  Upon debridement no underlying ulceration, drainage or any signs of infection.  Minimal interdigital tinea pedis is present dry, peeling, erythematous skin there is no drainage or pus or pustules.  No open lesions.  Callus formation along the nailbed  on the left hallux.  No open sore.  Vascular: Dorsalis Pedis artery and Posterior Tibial artery pedal pulses are 2/4 bilateral with immedate capillary fill time. There is no pain with calf compression, swelling, warmth, erythema.   Neruologic: Grossly intact via light touch bilateral.   Musculoskeletal: Hammertoes are present.  Muscular strength 5/5 in all groups tested bilateral.  Gait: Unassisted, Nonantalgic.       Assessment:   Hyperkeratotic lesion due to hammertoes, tinea pedis     Plan:  -Treatment options discussed including all alternatives, risks, and complications -Etiology of symptoms were discussed -Sharply debrided hyperkeratotic lesion without any  complications or bleeding.  Dispensed toe separators.  Wanted to hold off on x-rays.  If symptoms continue or worsen we will get x-rays. -She did not contact me with the names of the creams that she is previously.

## 2021-05-29 ENCOUNTER — Encounter: Payer: Self-pay | Admitting: Neurology

## 2021-06-08 ENCOUNTER — Other Ambulatory Visit: Payer: Self-pay | Admitting: Neurology

## 2021-06-09 ENCOUNTER — Other Ambulatory Visit: Payer: Self-pay | Admitting: *Deleted

## 2021-06-09 MED ORDER — DESMOPRESSIN ACETATE 0.2 MG PO TABS: 0.4000 mg | ORAL_TABLET | Freq: Every day | ORAL | 1 refills | Status: DC

## 2021-06-09 MED ORDER — HYDROCODONE-ACETAMINOPHEN 7.5-325 MG PO TABS: 0.5000 | ORAL_TABLET | Freq: Two times a day (BID) | ORAL | 0 refills | Status: DC | PRN

## 2021-06-09 NOTE — Telephone Encounter (Signed)
Received refill request for hydrocodone-acet.  Last OV was on 01/15/21.  Next OV is scheduled for 01/15/22 .  Last RX was written on 05/07/21 for 22 tabs.   [place] Drug Database has been reviewed.

## 2021-07-07 ENCOUNTER — Other Ambulatory Visit: Payer: Self-pay | Admitting: Neurology

## 2021-07-07 MED ORDER — HYDROCODONE-ACETAMINOPHEN 7.5-325 MG PO TABS: 0.5000 | ORAL_TABLET | Freq: Two times a day (BID) | ORAL | 0 refills | Status: DC | PRN

## 2021-07-07 NOTE — Telephone Encounter (Signed)
Last OV was on 01/15/21.  ?Next OV is scheduled for 01/15/22 .  ?Last RX was written on 06/09/21 for 45 tabs.  ? ?[place] Drug Database has been reviewed.  ?

## 2021-07-08 ENCOUNTER — Other Ambulatory Visit: Payer: Self-pay

## 2021-07-08 DIAGNOSIS — I10 Essential (primary) hypertension: Secondary | ICD-10-CM

## 2021-07-08 MED ORDER — LISINOPRIL 10 MG PO TABS: ORAL_TABLET | ORAL | 3 refills | Status: DC

## 2021-08-06 ENCOUNTER — Other Ambulatory Visit: Payer: Self-pay | Admitting: Neurology

## 2021-08-06 MED ORDER — HYDROCODONE-ACETAMINOPHEN 7.5-325 MG PO TABS: 0.5000 | ORAL_TABLET | Freq: Two times a day (BID) | ORAL | 0 refills | Status: DC | PRN

## 2021-08-06 NOTE — Telephone Encounter (Signed)
Last OV was on 01/15/21.  ?Next OV is scheduled for 01/15/22.  ?Last RX was written on 07/08/21 for 45 tabs.  ? ?[place] [facility] Database has been reviewed.  ?

## 2021-08-10 ENCOUNTER — Other Ambulatory Visit: Payer: Self-pay | Admitting: Family Medicine

## 2021-08-10 DIAGNOSIS — E1169 Type 2 diabetes mellitus with other specified complication: Secondary | ICD-10-CM

## 2021-09-04 ENCOUNTER — Other Ambulatory Visit: Payer: Self-pay | Admitting: Neurology

## 2021-09-04 MED ORDER — HYDROCODONE-ACETAMINOPHEN 7.5-325 MG PO TABS: 0.5000 | ORAL_TABLET | Freq: Two times a day (BID) | ORAL | 0 refills | Status: DC | PRN

## 2021-09-04 NOTE — Telephone Encounter (Signed)
Last OV was on 01/15/21.  ?Next OV is scheduled for 01/15/22.  ?Last RX was written on 08/06/21 for 45 tabs.  ? ?[place] Drug Database has been reviewed.  ?

## 2021-10-02 ENCOUNTER — Other Ambulatory Visit: Payer: Self-pay | Admitting: Neurology

## 2021-10-02 MED ORDER — HYDROCODONE-ACETAMINOPHEN 7.5-325 MG PO TABS: 0.5000 | ORAL_TABLET | Freq: Two times a day (BID) | ORAL | 0 refills | Status: DC | PRN

## 2021-10-02 NOTE — Telephone Encounter (Signed)
Last OV was on 01/15/21.  Next OV is scheduled for 01/15/22.  Last RX was written on 09/04/21 for 45 tabs.   [place] [facility] Database has been reviewed.

## 2021-10-22 ENCOUNTER — Other Ambulatory Visit: Payer: Self-pay | Admitting: Nurse Practitioner

## 2021-10-22 DIAGNOSIS — E1169 Type 2 diabetes mellitus with other specified complication: Secondary | ICD-10-CM

## 2021-11-03 ENCOUNTER — Other Ambulatory Visit: Payer: Self-pay | Admitting: Neurology

## 2021-11-03 MED ORDER — HYDROCODONE-ACETAMINOPHEN 7.5-325 MG PO TABS: 0.5000 | ORAL_TABLET | Freq: Two times a day (BID) | ORAL | 0 refills | Status: DC | PRN

## 2021-11-03 NOTE — Telephone Encounter (Signed)
Verify Drug Registry For Hydrocodone-Acetamin 7.5-325 Last Filled: 10/02/2021 Quantity: 45 tablets for 30 days Last appointment: 01/15/2021 Next appointment: 01/15/2022

## 2021-11-06 ENCOUNTER — Encounter: Payer: Self-pay | Admitting: Neurology

## 2021-11-09 ENCOUNTER — Other Ambulatory Visit: Payer: Self-pay | Admitting: Neurology

## 2021-11-27 LAB — HM DIABETES EYE EXAM

## 2021-12-03 ENCOUNTER — Other Ambulatory Visit: Payer: Self-pay | Admitting: Neurology

## 2021-12-03 MED ORDER — HYDROCODONE-ACETAMINOPHEN 7.5-325 MG PO TABS: 0.5000 | ORAL_TABLET | Freq: Two times a day (BID) | ORAL | 0 refills | Status: DC | PRN

## 2021-12-14 ENCOUNTER — Other Ambulatory Visit: Payer: Self-pay | Admitting: Nurse Practitioner

## 2021-12-15 NOTE — Telephone Encounter (Signed)
Requested Prescriptions  Pending Prescriptions Disp Refills  . ACCU-CHEK GUIDE test strip [Pharmacy Med Name: ACCU-CHEK GUIDE TEST STRIPS 100S] 200 strip 3    Sig: USE TWICE DAILY AS DIRECTED     Endocrinology: Diabetes - Testing Supplies Failed - 12/14/2021  8:17 AM      Failed - Valid encounter within last 12 months    Recent Outpatient Visits          1 year ago Type 2 diabetes mellitus with other specified complication, without long-term current use of insulin (HCC)   [facility] Valentino Nose, NP   1 year ago Type 2 diabetes mellitus with other specified complication, without long-term current use of insulin (HCC)   [facility] Valentino Nose, NP   1 year ago Pre-op evaluation   [facility] Valentino Nose, NP   2 years ago Adult general medical exam   [facility] Elmore Guise, FNP

## 2022-01-06 ENCOUNTER — Other Ambulatory Visit: Payer: Self-pay | Admitting: Neurology

## 2022-01-06 MED ORDER — HYDROCODONE-ACETAMINOPHEN 7.5-325 MG PO TABS: 0.5000 | ORAL_TABLET | Freq: Two times a day (BID) | ORAL | 0 refills | Status: DC | PRN

## 2022-01-08 ENCOUNTER — Encounter: Payer: Self-pay | Admitting: Neurology

## 2022-01-08 ENCOUNTER — Other Ambulatory Visit: Payer: Self-pay | Admitting: *Deleted

## 2022-01-08 DIAGNOSIS — G35 Multiple sclerosis: Secondary | ICD-10-CM

## 2022-01-08 MED ORDER — INTERFERON BETA-1A 30 MCG/0.5ML IM PSKT: 30.0000 ug | PREFILLED_SYRINGE | INTRAMUSCULAR | 1 refills | Status: DC

## 2022-01-12 ENCOUNTER — Encounter: Payer: Self-pay | Admitting: Neurology

## 2022-01-12 ENCOUNTER — Ambulatory Visit (INDEPENDENT_AMBULATORY_CARE_PROVIDER_SITE_OTHER): Payer: Medicare Other | Admitting: Neurology

## 2022-01-12 VITALS — BP 139/80 | HR 53 | Ht 64.5 in | Wt 193.6 lb

## 2022-01-12 DIAGNOSIS — R3915 Urgency of urination: Secondary | ICD-10-CM | POA: Diagnosis not present

## 2022-01-12 DIAGNOSIS — R42 Dizziness and giddiness: Secondary | ICD-10-CM

## 2022-01-12 DIAGNOSIS — G35 Multiple sclerosis: Secondary | ICD-10-CM

## 2022-01-12 DIAGNOSIS — K580 Irritable bowel syndrome with diarrhea: Secondary | ICD-10-CM

## 2022-01-12 DIAGNOSIS — R269 Unspecified abnormalities of gait and mobility: Secondary | ICD-10-CM

## 2022-01-12 DIAGNOSIS — G35D Multiple sclerosis, unspecified: Secondary | ICD-10-CM

## 2022-01-12 MED ORDER — DICYCLOMINE HCL 20 MG PO TABS: 20.0000 mg | ORAL_TABLET | Freq: Three times a day (TID) | ORAL | 5 refills | Status: DC

## 2022-01-12 NOTE — Progress Notes (Signed)
[facility]  PATIENT: Sonya Solomon DOB: 1945-07-09  REFERRING DOCTOR OR PCP: Dr. Jeanice Solomon SOURCE: Patient, notes from [facility] neurology, laboratory reports  _________________________________   HISTORICAL  CHIEF COMPLAINT:  Chief Complaint  Patient presents with   Follow-up    RM 16, alone. Last seen 01/15/21.     HISTORY OF PRESENT ILLNESS:  Sonya Solomon is a 76 y.o. woman with multiple sclerosis diagnosed in 2000  Update 01/12/2022 She feels her MS is stable .  She is on Avonex and tolerates it well.  She is doing every other week.  She has not had a definite exacerbation x 20 years.   She continues to have vertigo, especially after standing up.  She feels dizzy like on an uneven surface..  This lasts about a minute.      She notes balance is reduced and she uses a cane.   She is able to use stairs carefully.      She notes mild bilateral leg weakness.   No falls.   She has no numbness but gets stabbing pain in both legs.  This is worse at night in left > right leg but not much in feet.    She denies lower back pain.   She takes hydrocodone most nights.   She has urinary urgency and frequency, worse at night.   Solifenacin helps a bit.       She feels her vision is doing ok.   She had bilateral cataract surgery in 2020.     She notes fatigue but it does not prevent her from accomplishing tasks.   She wake sup several times at night with nocturia but quickly falls back asleep.   Mood is doing well.  Cognition is doing well   MS History:   She was diagnosed with MS in 2000 after presenting with diplopia and vertigo.  An MRI was consistent with MS and she was diagnosed with relapsing remitting MS.   She received several days of IV steroid and was placed on Avonex.   She gets intermittent vertigo but has not had ant definite exacerbation.     She has stayed on Avonex and feels stable.  She takes it every other week and tolerates it well.  She was diagnosed while  living in [place] but moved shortly thereafter to [facility] and has been seen Sonya Solomon most of that time  IMAGING:   MRI brain report from 11/16/2010 states that there were 3 new small foci of plaque enhancement in the right cerebral hemisphere and resolution of enhancement of a plaque in the right middle cerebellar peduncle that had been seen on a previous MRI (date unknown).  Additionally there are multiple chronic foci in the supratentorial and infratentorial white matter consistent with MS..      MRI brain 04/20/2020 showed discrete T2/flair hyperintense foci in the spinal cord adjacent to C2, left middle cerebellar peduncle, right medial cerebellar hemisphere.  There are single and confluent foci in the periventricular white matter of the hemispheres and discrete foci in the juxtacortical and deep white matter of the hemispheres.  None of the foci appear to be acute.  I compared the brain MRI from 11/06/2010 to the recent one 04/20/2020 --- she does show significant progression over that time.   MRI in 2012 showed 3 enhancing lesions  . At her request 2021 MRI was without contrast.  Vitamin D was normal (52) in 2017     REVIEW OF SYSTEMS: Constitutional: No  fevers, chills, sweats, or change in appetite Eyes: No visual changes, double vision, eye pain Ear, nose and throat: No hearing loss, ear pain, nasal congestion, sore throat Cardiovascular: No chest pain, palpitations Respiratory:  No shortness of breath at rest or with exertion.   No wheezes GastrointestinaI: No nausea, vomiting, diarrhea, abdominal pain, fecal incontinence Genitourinary:  No dysuria, urinary retention or frequency.  No nocturia. Musculoskeletal:  No neck pain, back pain Integumentary: No rash, pruritus, skin lesions Neurological: as above Psychiatric: No depression at this time.  No anxiety Endocrine: No palpitations, diaphoresis, change in appetite, change in weigh or increased thirst Hematologic/Lymphatic:  No  anemia, purpura, petechiae. Allergic/Immunologic: No itchy/runny eyes, nasal congestion, recent allergic reactions, rashes  ALLERGIES: Allergies  Allergen Reactions   Latex Rash    HOME MEDICATIONS:  Current Outpatient Medications:    desmopressin (DDAVP) 0.2 MG tablet, TAKE 2 TABLETS BY MOUTH  DAILY, Disp: 180 tablet, Rfl: 0   dicyclomine (BENTYL) 20 MG tablet, Take 1 tablet (20 mg total) by mouth 4 (four) times daily -  before meals and at bedtime., Disp: 120 tablet, Rfl: 5   HYDROcodone-acetaminophen (NORCO) 7.5-325 MG tablet, Take 0.5-1 tablets by mouth 2 (two) times daily as needed for moderate pain., Disp: 45 tablet, Rfl: 0   interferon beta-1a (AVONEX) 30 MCG/0.5ML PSKT injection, Inject 0.5 mLs (30 mcg total) into the muscle every 7 (seven) days., Disp: 4 each, Rfl: 1   lisinopril (ZESTRIL) 10 MG tablet, TAKE 1 TABLET(10 MG) BY MOUTH DAILY, Disp: 90 tablet, Rfl: 3   loperamide (IMODIUM) 2 MG capsule, Take 1 capsule (2 mg total) by mouth as needed for diarrhea or loose stools., Disp: 30 capsule, Rfl: 3   metFORMIN (GLUCOPHAGE) 1000 MG tablet, TAKE 1 TABLET(1000 MG) BY MOUTH TWICE DAILY WITH A MEAL, Disp: 180 tablet, Rfl: 1   Multiple Vitamin (MULTIVITAMIN) capsule, Take 1 capsule by mouth daily., Disp: , Rfl:    omeprazole (PRILOSEC) 20 MG capsule, Take 20 mg by mouth 2 (two) times daily., Disp: , Rfl:    pioglitazone (ACTOS) 45 MG tablet, Take 1 tablet (45 mg total) by mouth daily., Disp: 90 tablet, Rfl: 3   Semaglutide, 1 MG/DOSE, (OZEMPIC, 1 MG/DOSE,) 2 MG/1.5ML SOPN, Inject 1 mg into the skin once a week., Disp: 4.5 mL, Rfl: 3   simvastatin (ZOCOR) 40 MG tablet, TAKE 1 TABLET(40 MG) BY MOUTH DAILY, Disp: 90 tablet, Rfl: 0  PAST MEDICAL HISTORY: Past Medical History:  Diagnosis Date   Diabetes mellitus without complication (HCC)    MS (multiple sclerosis) (HCC)     PAST SURGICAL HISTORY: Past Surgical History:  Procedure Laterality Date   ABDOMINAL HYSTERECTOMY      APPENDECTOMY     BOWEL RESECTION  1996   BREAST REDUCTION SURGERY  07/10/1986   CATARACT EXTRACTION, BILATERAL     12/21/19, 09/19.21   CHOLECYSTECTOMY  05/2004   DE QUERVAIN'S RELEASE  05/18/2014   RADIAL STYLOIDECTOMY WRIST Left 05/10/2012   REDUCTION MAMMAPLASTY     TRIGGER FINGER RELEASE Left 01/09/2011   UMBILICAL HERNIA REPAIR  08/10/2006    FAMILY HISTORY: Family History  Problem Relation Age of Onset   Breast cancer Mother    Heart attack Father     SOCIAL HISTORY:  Social History   Socioeconomic History   Marital status: Married    Spouse name: Not on file   Number of children: Not on file   Years of education: Not on file   Highest education level:  Not on file  Occupational History   Not on file  Tobacco Use   Smoking status: Never   Smokeless tobacco: Never  Substance and Sexual Activity   Alcohol use: Never   Drug use: Never   Sexual activity: Not Currently  Other Topics Concern   Not on file  Social History Narrative   Lives with husband   Caffeine use: 2 cups per day   Right handed    Social Determinants of Health   Financial Resource Strain: Not on file  Food Insecurity: Not on file  Transportation Needs: Not on file  Physical Activity: Not on file  Stress: Not on file  Social Connections: Not on file  Intimate Partner Violence: Not on file     PHYSICAL EXAM  Vitals:   01/12/22 1450  BP: 139/80  Pulse: (!) 53  Weight: 193 lb 9.6 oz (87.8 kg)  Height: 5' 4.5" (1.638 m)    Body mass index is 32.72 kg/m.   General: The patient is well-developed and well-nourished and in no acute distress  HEENT:  Head is [place]/AT.  Sclera are anicteric.    Skin: Extremities are without rash or  edema.  Neurologic Exam  Mental status: The patient is alert and oriented x 3 at the time of the examination. The patient has apparent normal recent and remote memory, with an apparently normal attention span and concentration ability.   Speech is  normal.  Cranial nerves: Extraocular movements are full.  Facial strength and sensation was normal.  No obvious hearing deficits are noted.  Motor:  Muscle bulk is normal.   Tone is normal. Strength is  5 / 5 in all 4 extremities.   Sensory: Sensory testing is intact to pinprick, soft touch and vibration sensation in all 4 extremities.  Coordination: Cerebellar testing reveals good finger-nose-finger and heel-to-shin bilaterally.  Gait and station: Station is normal.  She is able to walk around the room without her cane though the gait is wide..  Tandem gait is very poor.  Romberg is negative.   Reflexes: Deep tendon reflexes are symmetric and normal bilaterally.        DIAGNOSTIC DATA (LABS, IMAGING, TESTING) - I reviewed patient records, labs, notes, testing and imaging myself where available.  Lab Results  Component Value Date   WBC 7.7 08/26/2020   HGB 13.1 08/26/2020   HCT 41.0 08/26/2020   MCV 93.8 08/26/2020   PLT 306 08/26/2020      Component Value Date/Time   NA 138 08/26/2020 1148   K 5.1 08/26/2020 1148   CL 102 08/26/2020 1148   CO2 24 08/26/2020 1148   GLUCOSE 143 (H) 08/26/2020 1148   BUN 26 (H) 08/26/2020 1148   CREATININE 1.13 (H) 08/26/2020 1148   CALCIUM 9.7 08/26/2020 1148   PROT 6.3 08/26/2020 1148   AST 14 08/26/2020 1148   ALT 9 08/26/2020 1148   BILITOT 0.4 08/26/2020 1148   GFRNONAA 48 (L) 08/26/2020 1148   GFRAA 56 (L) 08/26/2020 1148   Lab Results  Component Value Date   CHOL 111 08/26/2020   HDL 49 (L) 08/26/2020   LDLCALC 39 08/26/2020   TRIG 147 08/26/2020   CHOLHDL 2.3 08/26/2020   Lab Results  Component Value Date   HGBA1C 7.2 (H) 10/03/2020       ASSESSMENT AND PLAN  MS (multiple sclerosis) (HCC)  Vertigo  Gait disturbance  Urinary urgency  Irritable bowel syndrome with diarrhea  1    she had  significant MRI changes between 2012 and 2021 and she had enhancing lesions on the 2012 MRI.  She wishes to remain on  Avonex.  Many times, patients in the 70s are able to stop their disease modifying therapy.  She would prefer to stay on Avonex and not to have serial MRIs at this time.   2.   Continue DDAVP for nocturna urinary urgency/frequency 3.   Trial of gabapentin 300 mg nightly for RLS/dysestheias.   4.   Bentyl for IBS-D.   Stop if note any cognitive issues. 5.   Rtc 1 year.      Jacque Byron A. Epimenio Foot, MD, [facility] 01/12/2022, 6:13 PM Certified in Neurology, Clinical Neurophysiology, Sleep Medicine and Neuroimaging  [facility] [address], [address] [place], [place] [address] [phone]

## 2022-01-13 ENCOUNTER — Other Ambulatory Visit: Payer: Self-pay

## 2022-01-13 DIAGNOSIS — G35 Multiple sclerosis: Secondary | ICD-10-CM

## 2022-01-13 MED ORDER — INTERFERON BETA-1A 30 MCG/0.5ML IM PSKT: 30.0000 ug | PREFILLED_SYRINGE | INTRAMUSCULAR | 1 refills | Status: DC

## 2022-01-13 MED ORDER — AVONEX PEN 30 MCG/0.5ML IM AJKT: 30.0000 ug | AUTO-INJECTOR | INTRAMUSCULAR | 3 refills | Status: DC

## 2022-01-15 ENCOUNTER — Ambulatory Visit: Payer: Medicare Other | Admitting: Neurology

## 2022-01-31 ENCOUNTER — Other Ambulatory Visit: Payer: Self-pay | Admitting: Nurse Practitioner

## 2022-01-31 DIAGNOSIS — E1169 Type 2 diabetes mellitus with other specified complication: Secondary | ICD-10-CM

## 2022-02-04 ENCOUNTER — Other Ambulatory Visit: Payer: Self-pay | Admitting: Neurology

## 2022-02-04 MED ORDER — HYDROCODONE-ACETAMINOPHEN 7.5-325 MG PO TABS: 0.5000 | ORAL_TABLET | Freq: Two times a day (BID) | ORAL | 0 refills | Status: DC | PRN

## 2022-02-04 NOTE — Telephone Encounter (Signed)
Pt has up coming appt and been checked In the registry.

## 2022-02-05 ENCOUNTER — Other Ambulatory Visit: Payer: Self-pay | Admitting: Nurse Practitioner

## 2022-02-05 DIAGNOSIS — E1169 Type 2 diabetes mellitus with other specified complication: Secondary | ICD-10-CM

## 2022-02-05 NOTE — Telephone Encounter (Signed)
pt sees a new provider   Requested Prescriptions  Refused Prescriptions Disp Refills  . simvastatin (ZOCOR) 40 MG tablet [Pharmacy Med Name: SIMVASTATIN 40MG  TABLETS] 90 tablet 0    Sig: TAKE 1 TABLET(40 MG) BY MOUTH DAILY     Cardiovascular:  Antilipid - Statins Failed - 02/05/2022  8:14 AM      Failed - Valid encounter within last 12 months    Recent Outpatient Visits          1 year ago Type 2 diabetes mellitus with other specified complication, without long-term current use of insulin ([place])   [place] Noemi Chapel A, NP   1 year ago Type 2 diabetes mellitus with other specified complication, without long-term current use of insulin ([place])   [place] Eulogio Bear, NP   1 year ago Pre-op evaluation   [place] Eulogio Bear, NP   2 years ago Adult general medical exam   [place], Crystal A, FNP             Failed - Lipid Panel in normal range within the last 12 months    Cholesterol  Date Value Ref Range Status  08/26/2020 111 <200 mg/dL Final   LDL Cholesterol (Calc)  Date Value Ref Range Status  08/26/2020 39 mg/dL (calc) Final    Comment:    Reference range: <100 . Desirable range <100 mg/dL for primary prevention;   <70 mg/dL for patients with CHD or diabetic patients  with > or = 2 CHD risk factors. Marland Kitchen LDL-C is now calculated using the Martin-Hopkins  calculation, which is a validated novel method providing  better accuracy than the Friedewald equation in the  estimation of LDL-C.  Cresenciano Genre et al. Annamaria Helling. 4098;119(14): 2061-2068  (http://education.[facility].com/faq/FAQ164)    HDL  Date Value Ref Range Status  08/26/2020 49 (L) > OR = 50 mg/dL Final   Triglycerides  Date Value Ref Range Status  08/26/2020 147 <150 mg/dL Final         Passed - Patient is not pregnant

## 2022-02-08 ENCOUNTER — Other Ambulatory Visit: Payer: Self-pay | Admitting: Nurse Practitioner

## 2022-02-08 DIAGNOSIS — E1169 Type 2 diabetes mellitus with other specified complication: Secondary | ICD-10-CM

## 2022-03-01 ENCOUNTER — Encounter: Payer: Self-pay | Admitting: Neurology

## 2022-03-02 ENCOUNTER — Other Ambulatory Visit: Payer: Self-pay | Admitting: Neurology

## 2022-03-02 MED ORDER — DICYCLOMINE HCL 20 MG PO TABS: ORAL_TABLET | ORAL | 5 refills | Status: DC

## 2022-03-06 ENCOUNTER — Other Ambulatory Visit: Payer: Self-pay | Admitting: Neurology

## 2022-03-09 MED ORDER — HYDROCODONE-ACETAMINOPHEN 7.5-325 MG PO TABS: 0.5000 | ORAL_TABLET | Freq: Two times a day (BID) | ORAL | 0 refills | Status: DC | PRN

## 2022-04-06 ENCOUNTER — Other Ambulatory Visit: Payer: Self-pay | Admitting: Neurology

## 2022-04-06 MED ORDER — HYDROCODONE-ACETAMINOPHEN 7.5-325 MG PO TABS: 0.5000 | ORAL_TABLET | Freq: Two times a day (BID) | ORAL | 0 refills | Status: DC | PRN

## 2022-05-14 ENCOUNTER — Other Ambulatory Visit: Payer: Self-pay | Admitting: Neurology

## 2022-05-14 MED ORDER — HYDROCODONE-ACETAMINOPHEN 7.5-325 MG PO TABS: 0.5000 | ORAL_TABLET | Freq: Two times a day (BID) | ORAL | 0 refills | Status: DC | PRN

## 2022-05-28 ENCOUNTER — Encounter: Payer: Self-pay | Admitting: Neurology

## 2022-05-28 ENCOUNTER — Ambulatory Visit: Payer: Medicare Other | Admitting: Neurology

## 2022-06-03 ENCOUNTER — Encounter: Payer: Self-pay | Admitting: Neurology

## 2022-06-15 ENCOUNTER — Other Ambulatory Visit: Payer: Self-pay | Admitting: Neurology

## 2022-06-15 MED ORDER — HYDROCODONE-ACETAMINOPHEN 7.5-325 MG PO TABS: 0.5000 | ORAL_TABLET | Freq: Two times a day (BID) | ORAL | 0 refills | Status: DC | PRN

## 2022-07-12 ENCOUNTER — Other Ambulatory Visit: Payer: Self-pay | Admitting: Neurology

## 2022-07-12 ENCOUNTER — Other Ambulatory Visit: Payer: Self-pay | Admitting: Family Medicine

## 2022-07-12 DIAGNOSIS — I10 Essential (primary) hypertension: Secondary | ICD-10-CM

## 2022-07-13 MED ORDER — HYDROCODONE-ACETAMINOPHEN 7.5-325 MG PO TABS: 0.5000 | ORAL_TABLET | Freq: Two times a day (BID) | ORAL | 0 refills | Status: DC | PRN

## 2022-08-15 ENCOUNTER — Other Ambulatory Visit: Payer: Self-pay | Admitting: Neurology

## 2022-08-17 MED ORDER — HYDROCODONE-ACETAMINOPHEN 7.5-325 MG PO TABS: 0.5000 | ORAL_TABLET | Freq: Two times a day (BID) | ORAL | 0 refills | Status: DC | PRN

## 2022-08-17 NOTE — Telephone Encounter (Signed)
Last seen on 01/12/22 Follow up scheduled on 01/14/23 Last filled on 07/16/22 # 45 tablets (22 day supply) Rx pending to be signed

## 2022-09-15 ENCOUNTER — Other Ambulatory Visit: Payer: Self-pay | Admitting: Neurology

## 2022-09-16 MED ORDER — HYDROCODONE-ACETAMINOPHEN 7.5-325 MG PO TABS: 0.5000 | ORAL_TABLET | Freq: Two times a day (BID) | ORAL | 0 refills | Status: DC | PRN

## 2022-09-16 NOTE — Telephone Encounter (Signed)
Last seen on 01/12/22  Follow up scheduled on 01/14/23  Last filled on 08/17/22 # 45 tablets (30 day supply)  Rx pending to signed

## 2022-09-21 ENCOUNTER — Encounter: Payer: Self-pay | Admitting: Neurology

## 2022-09-22 ENCOUNTER — Encounter: Payer: Self-pay | Admitting: Neurology

## 2022-09-22 NOTE — Telephone Encounter (Signed)
Letter printed and placed in Dr.Sater box to sign.

## 2022-09-28 ENCOUNTER — Encounter: Payer: Self-pay | Admitting: Neurology

## 2022-09-29 ENCOUNTER — Other Ambulatory Visit: Payer: Self-pay | Admitting: *Deleted

## 2022-09-29 MED ORDER — DESMOPRESSIN ACETATE 0.1 MG PO TABS: 0.1000 mg | ORAL_TABLET | Freq: Every day | ORAL | 1 refills | Status: DC

## 2022-10-14 ENCOUNTER — Other Ambulatory Visit: Payer: Self-pay | Admitting: Neurology

## 2022-10-14 MED ORDER — HYDROCODONE-ACETAMINOPHEN 7.5-325 MG PO TABS: 0.5000 | ORAL_TABLET | Freq: Two times a day (BID) | ORAL | 0 refills | Status: DC | PRN

## 2022-10-14 NOTE — Telephone Encounter (Signed)
Last office visit: 01/12/2022 Next office visit: 01/14/2023

## 2022-10-18 ENCOUNTER — Other Ambulatory Visit: Payer: Self-pay | Admitting: Neurology

## 2022-10-19 ENCOUNTER — Other Ambulatory Visit: Payer: Self-pay

## 2022-11-12 ENCOUNTER — Other Ambulatory Visit: Payer: Self-pay

## 2022-11-12 MED ORDER — AVONEX PEN 30 MCG/0.5ML IM AJKT: 30.0000 ug | AUTO-INJECTOR | INTRAMUSCULAR | 0 refills | Status: DC

## 2022-11-14 ENCOUNTER — Other Ambulatory Visit: Payer: Self-pay | Admitting: Neurology

## 2022-11-16 MED ORDER — HYDROCODONE-ACETAMINOPHEN 7.5-325 MG PO TABS: 0.5000 | ORAL_TABLET | Freq: Two times a day (BID) | ORAL | 0 refills | Status: DC | PRN

## 2022-11-25 ENCOUNTER — Telehealth: Payer: Self-pay | Admitting: Neurology

## 2022-11-25 NOTE — Telephone Encounter (Signed)
LVM and sent mychart msg informing pt of need to reschedule 01/14/23 appt - MD out

## 2022-12-16 ENCOUNTER — Other Ambulatory Visit: Payer: Self-pay | Admitting: Neurology

## 2022-12-17 ENCOUNTER — Encounter: Payer: Self-pay | Admitting: Neurology

## 2022-12-17 MED ORDER — HYDROCODONE-ACETAMINOPHEN 7.5-325 MG PO TABS: 0.5000 | ORAL_TABLET | Freq: Two times a day (BID) | ORAL | 0 refills | Status: DC | PRN

## 2022-12-17 NOTE — Telephone Encounter (Signed)
Patient last seen on 01/12/22 Follow up scheduled on 03/09/23 Last filled on 11/16/22 #45 tablets (22 day supply) Rx was filled today.

## 2022-12-17 NOTE — Telephone Encounter (Signed)
  Patient requesting refill for medication, per epic last refill was 07.15.2024. please review and fill if agreeable. thanks

## 2022-12-23 IMAGING — MR MR HUMERUS*L* WO/W CM
8 of 10 series · 32 of 40 positions shown · IV contrast (Multihance 20ml)
Comparison: Ultrasound 02/27/2021

CLINICAL DATA: Soft tissue mass of left upper extremity for at
least 6 months. No known injury.

EXAM:
MRI OF THE LEFT HUMERUS WITHOUT AND WITH CONTRAST
TECHNIQUE: Multiplanar, multisequence MR imaging of the left humerus was
performed before and after the administration of intravenous
contrast.
CONTRAST:  20mL MULTIHANCE GADOBENATE DIMEGLUMINE 529 MG/ML IV SOLN

[Series 7: T2 fat-sat · axial · left · 6.0mm · 0.62mm/px · z∈[-107,+156]mm · 6 of 40 slices shown (1 of 2)]
[im 1/40]
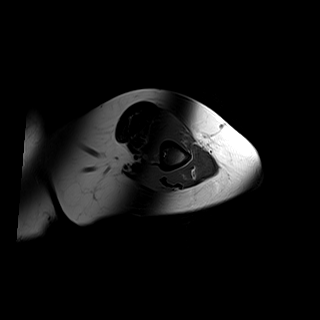
[im 8/40]
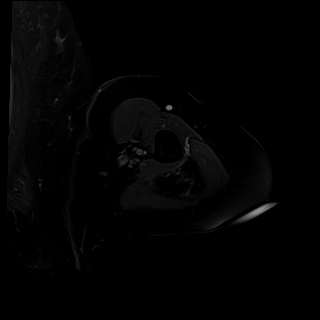
[im 16/40]
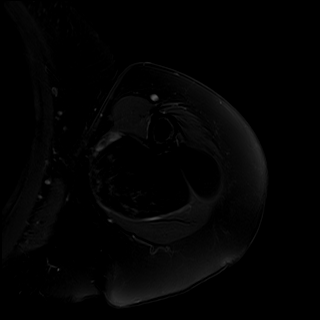
[im 24/40]
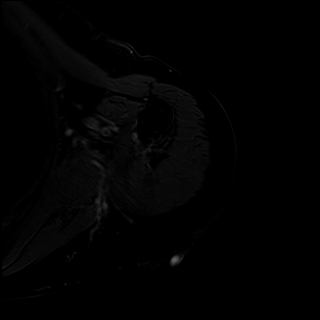
[im 32/40]
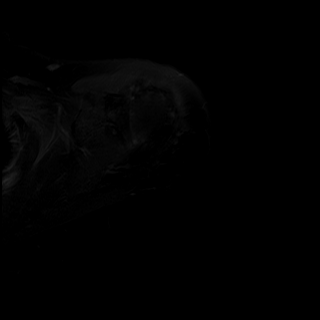
[im 40/40]
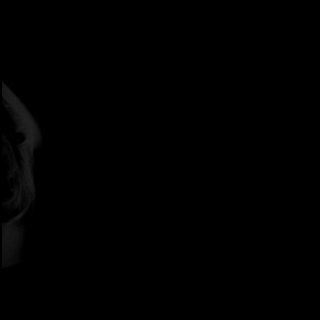

[Series 8: T1 · axial · left · 6.0mm · 0.62mm/px · z∈[-107,+156]mm · 6 of 40 slices shown (1 of 3)]
[im 1/40]
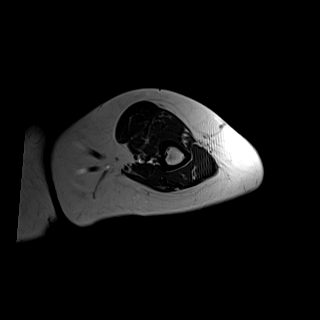
[im 8/40]
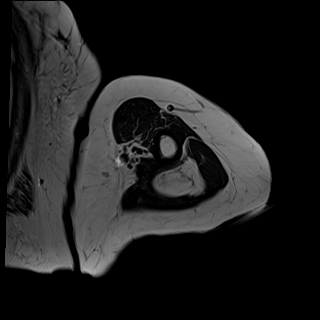
[im 16/40]
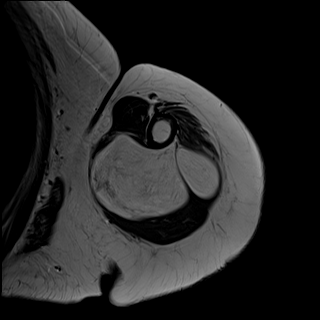
[im 24/40]
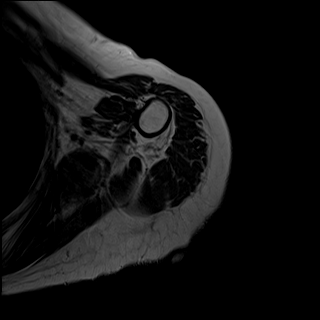
[im 32/40]
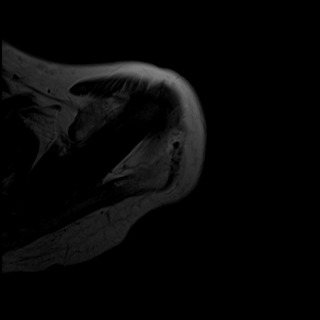
[im 40/40]
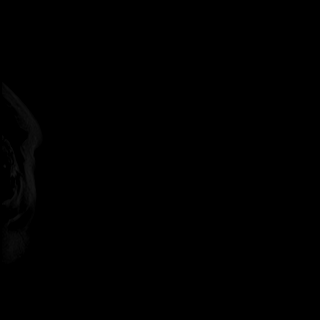

[Series 9: T1 fat-sat · axial · left · 6.0mm · 0.62mm/px · z∈[-107,+156]mm · 5 of 40 slices shown]
[im 1/40]
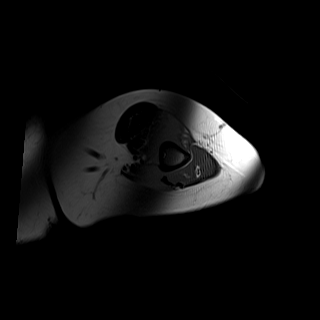
[im 10/40]
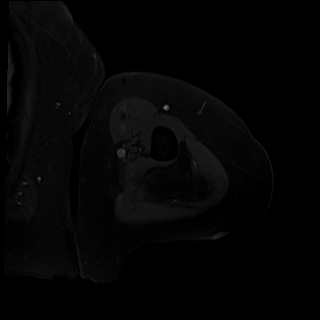
[im 20/40]
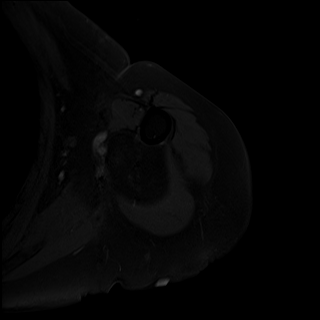
[im 30/40]
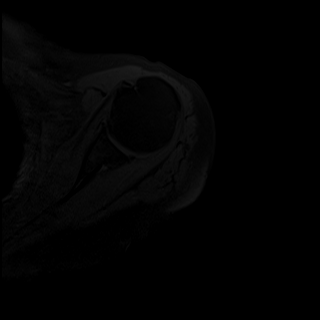
[im 40/40]
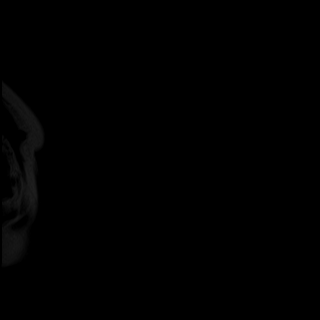

[Series 10: T1 · coronal · left · 4.5mm · 0.91mm/px · 3 of 27 slices shown (2 of 3)]
[im 1/27]
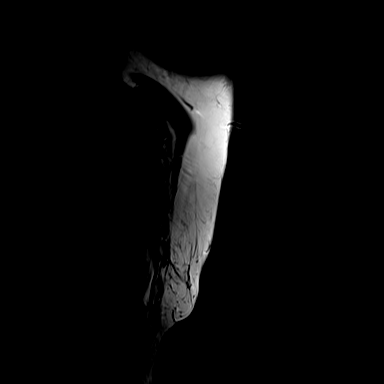
[im 14/27]
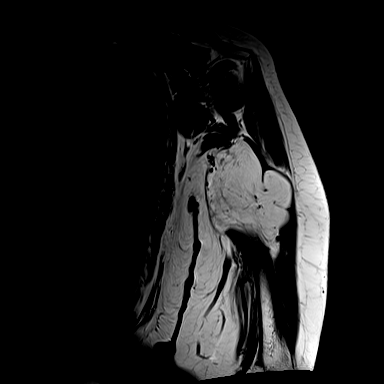
[im 27/27]
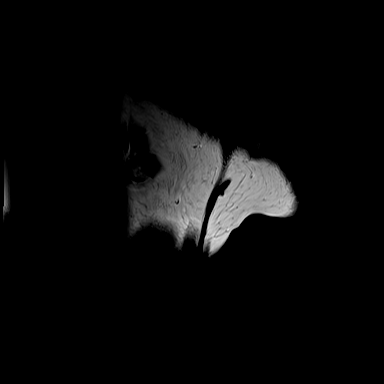

[Series 11: T2 fat-sat · coronal · left · 4.5mm · 0.91mm/px · 3 of 27 slices shown (2 of 2)]
[im 1/27]
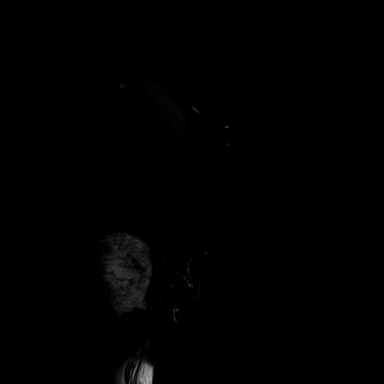
[im 14/27]
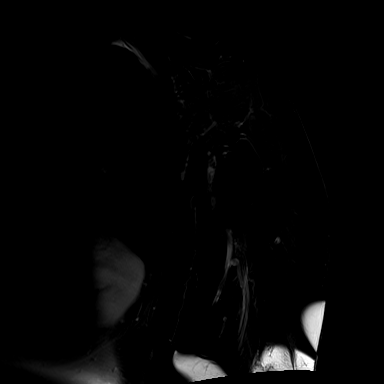
[im 27/27]
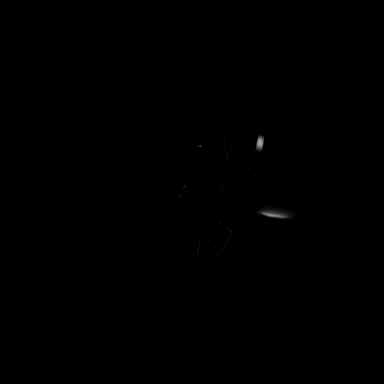

[Series 12: T1 · sagittal · left · 4.5mm · 0.39mm/px · 3 of 27 slices shown (3 of 3)]
[im 1/27]
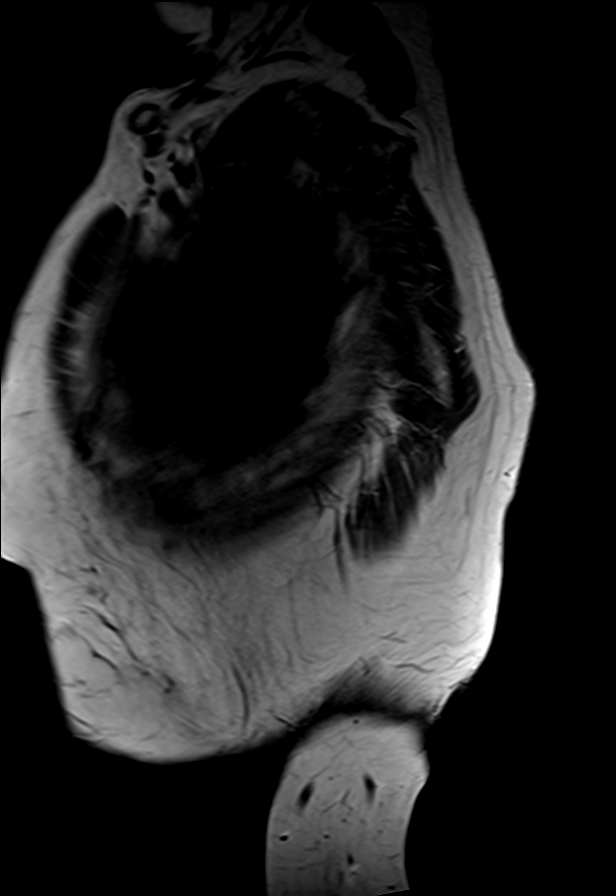
[im 14/27]
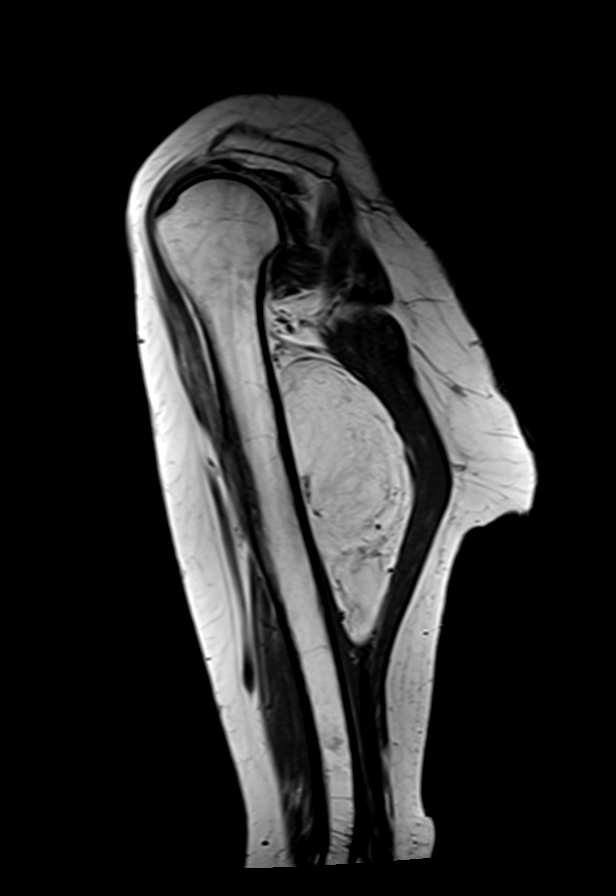
[im 27/27]
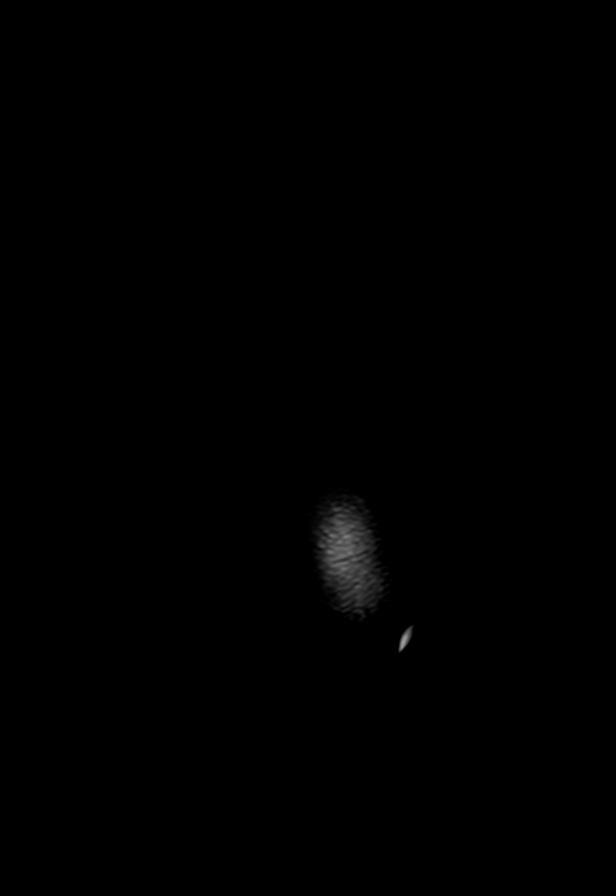

[Series 14: T1 fat-sat post-contrast · axial · left · 6.0mm · 0.62mm/px · z∈[-107,+156]mm · 5 of 40 slices shown (1 of 2)]
[im 1/40]
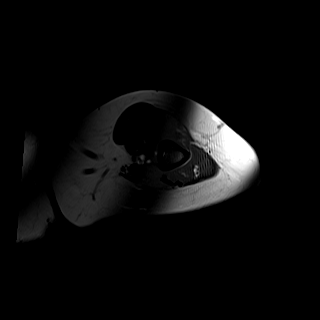
[im 10/40]
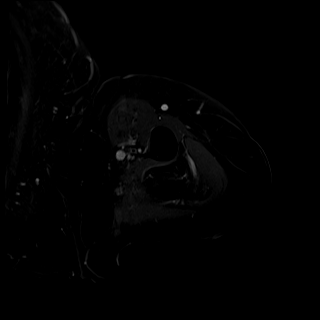
[im 20/40]
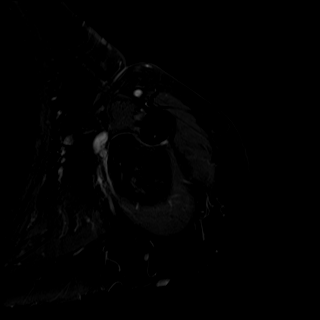
[im 30/40]
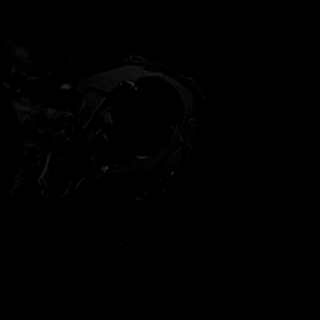
[im 40/40]
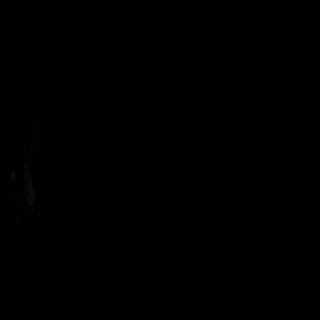

[Series 15: T1 fat-sat post-contrast · coronal · left · 4.5mm · 0.91mm/px · 1 of 27 slices shown (2 of 2)]
[im 1/27]
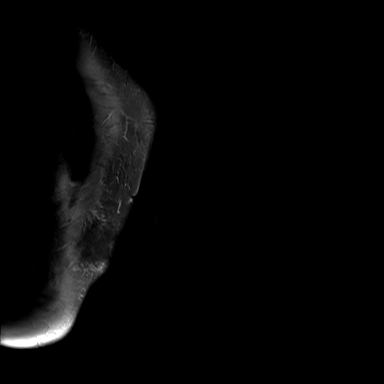

[32 of 40 positions shown; findings below may reference images not displayed]

FINDINGS: Bones/Joint/Cartilage

No acute fracture or dislocation. Degenerative changes of the
glenohumeral and acromioclavicular joints. Evaluation of the
intra-articular structures is limited on large field-of-view
sequences.

Ligaments

Intact.

Muscles and Tendons

Grossly intact rotator cuff. No muscle atrophy or fatty
infiltration.

Soft tissues

Large predominantly T1 hyperintense soft tissue mass located within
the triceps musculature within the posterior compartment of the
upper arm measuring 11.3 x 5.3 x 8.1 cm (series 12, image 14; series
8, image 26). Internal complexity with numerous thin septations and
patchy T2 hyperintensity with enhancement. No focal solid or nodular
internal component. Mass abuts the posterior cortex of the humeral
diaphysis. No extension beyond the fascial planes of the posterior
compartment. Mass results in anterior displacement of the brachial
neurovascular bundle.

Soft tissues appear otherwise within normal limits. No left axillary
lymphadenopathy. No soft tissue edema or fluid collection.
IMPRESSION: Large, predominantly fat signal soft tissue mass located within the
posterior compartment of the upper arm measuring 11.3 x 5.3 x
cm. Internal complexity with numerous thin septations and patchy T2
hyperintensity with enhancement. No focal solid or nodular internal
component. Findings are most compatible with an atypical lipomatous
tumor/well-differentiated liposarcoma.

## 2022-12-30 ENCOUNTER — Other Ambulatory Visit: Payer: Self-pay

## 2022-12-30 MED ORDER — AVONEX PEN 30 MCG/0.5ML IM AJKT: 30.0000 ug | AUTO-INJECTOR | INTRAMUSCULAR | 0 refills | Status: DC

## 2023-01-06 ENCOUNTER — Telehealth: Payer: Self-pay | Admitting: Neurology

## 2023-01-06 NOTE — Telephone Encounter (Signed)
LVM and sent mychart msg informing pt of need to reschedule 03/09/23 appt - office closing for election day

## 2023-01-14 ENCOUNTER — Ambulatory Visit: Payer: Medicare Other | Admitting: Neurology

## 2023-01-15 ENCOUNTER — Other Ambulatory Visit: Payer: Self-pay | Admitting: Neurology

## 2023-01-19 MED ORDER — HYDROCODONE-ACETAMINOPHEN 7.5-325 MG PO TABS: 0.5000 | ORAL_TABLET | Freq: Two times a day (BID) | ORAL | 0 refills | Status: DC | PRN

## 2023-01-19 NOTE — Telephone Encounter (Signed)
Dr.Chima you are work in provider, Dr.Sater is out this week. Last seen on 01/12/22 Follow up scheduled on 07/20/23 Last filled on 12/17/22 #45 tablets (22 day supply) Rx pending to be signed

## 2023-01-25 ENCOUNTER — Other Ambulatory Visit: Payer: Self-pay | Admitting: Neurology

## 2023-01-26 ENCOUNTER — Telehealth: Payer: Self-pay | Admitting: *Deleted

## 2023-01-26 NOTE — Telephone Encounter (Signed)
Attempted to renew and pop up stated needed PA on Avonex.

## 2023-01-28 NOTE — Telephone Encounter (Signed)
I called [facility] for avonex, pt has refills until end of year.

## 2023-02-01 ENCOUNTER — Telehealth: Payer: Self-pay

## 2023-02-01 ENCOUNTER — Other Ambulatory Visit (HOSPITAL_COMMUNITY): Payer: Self-pay

## 2023-02-01 DIAGNOSIS — G35 Multiple sclerosis: Secondary | ICD-10-CM

## 2023-02-01 NOTE — Telephone Encounter (Signed)
PA request has been Submitted. New Encounter created for follow up. For additional info see Pharmacy Prior Auth telephone encounter from 02/01/2023.

## 2023-02-01 NOTE — Telephone Encounter (Signed)
Pharmacy Patient Advocate Encounter   Received notification from Physician's Office that prior authorization for Avonex Pen 30MCG/0.5ML auto-injector kit is required/requested.   Insurance verification completed.   The patient is insured through [facility] .   Per test claim: PA required; PA submitted to [facility] via CoverMyMeds Key/confirmation #/EOC [id number] Status is pending

## 2023-02-01 NOTE — Telephone Encounter (Signed)
Received a request to do a PA however per test claim PT has PAP-

## 2023-02-01 NOTE — Telephone Encounter (Signed)
Prior auth still needs to be completed even though patient received via PAP. Can you please complete? Thank you

## 2023-02-03 MED ORDER — AVONEX PEN 30 MCG/0.5ML IM AJKT
AUTO-INJECTOR | INTRAMUSCULAR | 5 refills | Status: DC
Start: 2023-02-03 — End: 2023-12-09

## 2023-02-03 NOTE — Telephone Encounter (Signed)
Pharmacy Patient Advocate Encounter  Received notification from [facility] that Prior Authorization for Avonex Pen 30MCG/0.5ML auto-injector kit has been DENIED.  Full denial letter will be uploaded to the media tab. See denial reason below.    PA #/Case ID/Reference #: PA Case ID #: [id number]

## 2023-02-03 NOTE — Telephone Encounter (Signed)
Took call from phone room and spoke w/ [facility]. They spoke with pt who informed them she is taking Avonex once a month, not every 7 days like directions on rx sent on 01/26/23. However, per Dr. Bonnita Hollow last OV note 01/2022: he mentioned: "She is on Avonex and tolerates it well. She is doing every other week. "  Asked they placed rx on hold until I can clarify with pt and MD on how she should be taking prescription. We will then provide them an update. She verbalized understanding.  Called pt at [phone]. She is taking just taking once a month not every other week. She decided to do this after last visit, MD not aware. She was told she could come off at some point and decided since she has not had any relapses to go down to once a month herself. She is stable and doing well. Her last OV was 01/2022. She cx 05/28/22 appt d/t being sick. We cx 01/14/23 d/t MD being out and 03/09/23 appt d/t office closing early that day. Offered sooner appt 03/28/24 at 2:00pm. She declined stating Monday's are not good for her. Aware I will add to wait list (she prefers afternoon appt after 1pm, cannot do morning appt).    I spoke w/ Dr. Epimenio Foot. He is ok to continue rx Avonex for her to take every other week. If she only takes once monthly, ok. I e-scribed updated rx to pharmacy to reflect this.

## 2023-02-03 NOTE — Telephone Encounter (Signed)
Looks like pt approved via PAP through the end of the yr

## 2023-02-03 NOTE — Addendum Note (Signed)
Addended by: Arther Abbott on: 02/03/2023 03:34 PM   Modules accepted: Orders

## 2023-02-04 NOTE — Telephone Encounter (Signed)
Called pt. Offered appt today at 4pm with Dr. Epimenio Foot. She declined, husband has appt today. Aware I will keep her on cx list.

## 2023-02-15 ENCOUNTER — Encounter: Payer: Self-pay | Admitting: Neurology

## 2023-02-17 ENCOUNTER — Other Ambulatory Visit: Payer: Self-pay | Admitting: Neurology

## 2023-02-17 ENCOUNTER — Encounter: Payer: Self-pay | Admitting: Neurology

## 2023-02-17 MED ORDER — HYDROCODONE-ACETAMINOPHEN 7.5-325 MG PO TABS: 0.5000 | ORAL_TABLET | Freq: Two times a day (BID) | ORAL | 0 refills | Status: DC | PRN

## 2023-02-18 ENCOUNTER — Telehealth: Payer: Self-pay | Admitting: Neurology

## 2023-02-18 NOTE — Telephone Encounter (Signed)
Called back and spoke w/ Joni Reining.  Confirmed rx sent in on 02/03/23 correct. Nothing further needed.

## 2023-02-18 NOTE — Telephone Encounter (Signed)
At 2:45 [facility]  [employer]s  left a vm asking to be called back at [phone] option 2 to verify the dosing for pt's AVONEX PEN

## 2023-02-25 ENCOUNTER — Encounter: Payer: Self-pay | Admitting: Neurology

## 2023-02-25 ENCOUNTER — Ambulatory Visit (INDEPENDENT_AMBULATORY_CARE_PROVIDER_SITE_OTHER): Payer: Medicare Other | Admitting: Neurology

## 2023-02-25 VITALS — BP 146/75 | HR 46 | Ht 64.0 in | Wt 186.5 lb

## 2023-02-25 DIAGNOSIS — R42 Dizziness and giddiness: Secondary | ICD-10-CM | POA: Diagnosis not present

## 2023-02-25 DIAGNOSIS — R3915 Urgency of urination: Secondary | ICD-10-CM | POA: Diagnosis not present

## 2023-02-25 DIAGNOSIS — G35 Multiple sclerosis: Secondary | ICD-10-CM

## 2023-02-25 DIAGNOSIS — R269 Unspecified abnormalities of gait and mobility: Secondary | ICD-10-CM

## 2023-02-25 NOTE — Addendum Note (Signed)
Addended by: Despina Arias A on: 02/25/2023 04:49 PM   Modules accepted: Level of Service

## 2023-02-25 NOTE — Progress Notes (Addendum)
[facility]  PATIENT: Sonya Solomon DOB: [birth year]  REFERRING DOCTOR OR PCP: Dr. Jeanice Lim SOURCE: Patient, notes from [facility] neurology, laboratory reports  _________________________________   HISTORICAL  CHIEF COMPLAINT:  Chief Complaint  Patient presents with   Room 10    Pt is here Alone. Pt states that she has been doing great since her last appointment. Pt states that she has been without relapsing for 24 years.     HISTORY OF PRESENT ILLNESS:  Sonya Solomon is a 77 y.o. woman with multiple sclerosis diagnosed in 2000  Update 02/25/2023 She feels her MS is stable .  She is on Avonex and tolerates it well.  She is doing every other week.  She has not had a definite exacerbation x 20 years.     She has less vertigo than last visit.  Gait is better an she is not using her cane..   She is able to use stairs carefully.    No falls.     She notes mild bilateral leg weakness, left a little more than right.   Some left leg pain as well.   .   She has no numbness but gets stabbing pain in both legs.  This is worse at night in left > right leg but not much in feet.   She takes hydrocodone most nights.   She has urinary urgency and frequency, worse at night.   DDAVP 0.1 mg has helped.   Solifenacin was switched to another medication but it is expensive and she may stop..   She feels her vision is doing ok.   She had bilateral cataract surgery in 2020.     She notes fatigue but it does not prevent her from accomplishing tasks.  She is sleeping better with DDAVP to reduce nocturia.   Mood is doing well.  Cognition is doing well   MS History:   She was diagnosed with MS in 2000 after presenting with diplopia and vertigo.  An MRI was consistent with MS and she was diagnosed with relapsing remitting MS.   She received several days of IV steroid and was placed on Avonex.   She gets intermittent vertigo but has not had ant definite exacerbation.     She has stayed on  Avonex and feels stable.  She takes it every other week and tolerates it well.  She was diagnosed while living in [place] but moved shortly thereafter to [facility] and has been seen Wilber Oliphant most of that time  IMAGING:   MRI brain report from 11/16/2010 states that there were 3 new small foci of plaque enhancement in the right cerebral hemisphere and resolution of enhancement of a plaque in the right middle cerebellar peduncle that had been seen on a previous MRI (date unknown).  Additionally there are multiple chronic foci in the supratentorial and infratentorial white matter consistent with MS..      MRI brain 04/20/2020 showed discrete T2/flair hyperintense foci in the spinal cord adjacent to C2, left middle cerebellar peduncle, right medial cerebellar hemisphere.  There are single and confluent foci in the periventricular white matter of the hemispheres and discrete foci in the juxtacortical and deep white matter of the hemispheres.  None of the foci appear to be acute.  I compared the brain MRI from 11/06/2010 to the recent one 04/20/2020 --- she does show significant progression over that time.   MRI in 2012 showed 3 enhancing lesions  . At her request 2021 MRI was  without contrast.  Vitamin D was normal (52) in 2017     REVIEW OF SYSTEMS: Constitutional: No fevers, chills, sweats, or change in appetite Eyes: No visual changes, double vision, eye pain Ear, nose and throat: No hearing loss, ear pain, nasal congestion, sore throat Cardiovascular: No chest pain, palpitations Respiratory:  No shortness of breath at rest or with exertion.   No wheezes GastrointestinaI: No nausea, vomiting, diarrhea, abdominal pain, fecal incontinence Genitourinary:  No dysuria, urinary retention or frequency.  No nocturia. Musculoskeletal:  No neck pain, back pain Integumentary: No rash, pruritus, skin lesions Neurological: as above Psychiatric: No depression at this time.  No anxiety Endocrine: No  palpitations, diaphoresis, change in appetite, change in weigh or increased thirst Hematologic/Lymphatic:  No anemia, purpura, petechiae. Allergic/Immunologic: No itchy/runny eyes, nasal congestion, recent allergic reactions, rashes  ALLERGIES: Allergies  Allergen Reactions   Latex Rash    HOME MEDICATIONS:  Current Outpatient Medications:    desmopressin (DDAVP) 0.1 MG tablet, Take 1 tablet (0.1 mg total) by mouth at bedtime., Disp: 90 tablet, Rfl: 1   HYDROcodone-acetaminophen (NORCO) 7.5-325 MG tablet, Take 0.5-1 tablets by mouth 2 (two) times daily as needed for moderate pain (pain score 4-6)., Disp: 45 tablet, Rfl: 0   Interferon Beta-1a (AVONEX PEN) 30 MCG/0.5ML AJKT, INJECT 30 MCG (1 INJECTOR) INTRAMUSCULARLY EVERY OTHER WEEK, Disp: 2 each, Rfl: 5   lisinopril (ZESTRIL) 10 MG tablet, TAKE 1 TABLET(10 MG) BY MOUTH DAILY, Disp: 90 tablet, Rfl: 3   loperamide (IMODIUM) 2 MG capsule, Take 1 capsule (2 mg total) by mouth as needed for diarrhea or loose stools., Disp: 30 capsule, Rfl: 3   metFORMIN (GLUCOPHAGE) 1000 MG tablet, TAKE 1 TABLET(1000 MG) BY MOUTH TWICE DAILY WITH A MEAL, Disp: 180 tablet, Rfl: 1   Multiple Vitamin (MULTIVITAMIN) capsule, Take 1 capsule by mouth daily., Disp: , Rfl:    omeprazole (PRILOSEC) 20 MG capsule, Take 20 mg by mouth 2 (two) times daily., Disp: , Rfl:    pioglitazone (ACTOS) 45 MG tablet, Take 1 tablet (45 mg total) by mouth daily., Disp: 90 tablet, Rfl: 3   Semaglutide, 1 MG/DOSE, (OZEMPIC, 1 MG/DOSE,) 2 MG/1.5ML SOPN, Inject 1 mg into the skin once a week., Disp: 4.5 mL, Rfl: 3   simvastatin (ZOCOR) 40 MG tablet, TAKE 1 TABLET(40 MG) BY MOUTH DAILY, Disp: 90 tablet, Rfl: 0   dicyclomine (BENTYL) 20 MG tablet, Take one or two po qAC and qHS, Disp: 240 tablet, Rfl: 5  PAST MEDICAL HISTORY: Past Medical History:  Diagnosis Date   Diabetes mellitus without complication (HCC)    MS (multiple sclerosis) (HCC)     PAST SURGICAL HISTORY: Past Surgical  History:  Procedure Laterality Date   ABDOMINAL HYSTERECTOMY     APPENDECTOMY     BOWEL RESECTION  1996   BREAST REDUCTION SURGERY  07/10/1986   CATARACT EXTRACTION, BILATERAL     12/21/19, 09/19.21   CHOLECYSTECTOMY  05/2004   DE QUERVAIN'S RELEASE  05/18/2014   RADIAL STYLOIDECTOMY WRIST Left 05/10/2012   REDUCTION MAMMAPLASTY     TRIGGER FINGER RELEASE Left 01/09/2011   UMBILICAL HERNIA REPAIR  08/10/2006    FAMILY HISTORY: Family History  Problem Relation Age of Onset   Breast cancer Mother    Heart attack Father     SOCIAL HISTORY:  Social History   Socioeconomic History   Marital status: Married    Spouse name: Not on file   Number of children: Not on file   Years  of education: Not on file   Highest education level: Not on file  Occupational History   Not on file  Tobacco Use   Smoking status: Never   Smokeless tobacco: Never  Substance and Sexual Activity   Alcohol use: Never   Drug use: Never   Sexual activity: Not Currently  Other Topics Concern   Not on file  Social History Narrative   Lives with husband   Caffeine use: 2 cups per day   Right handed    Social Determinants of Health   Financial Resource Strain: Not on file  Food Insecurity: Not on file  Transportation Needs: Not on file  Physical Activity: Not on file  Stress: Not on file  Social Connections: Not on file  Intimate Partner Violence: Not on file     PHYSICAL EXAM  Vitals:   02/25/23 1507  BP: (!) 146/75  Pulse: (!) 46  Weight: 186 lb 8 oz (84.6 kg)  Height: 5\' 4"  (1.626 m)    Body mass index is 32.01 kg/m.   General: The patient is well-developed and well-nourished and in no acute distress  HEENT:  Head is [place]/AT.  Sclera are anicteric.    Skin: Extremities are without rash or  edema.  Neurologic Exam  Mental status: The patient is alert and oriented x 3 at the time of the examination. The patient has apparent normal recent and remote memory, with an apparently  normal attention span and concentration ability.   Speech is normal.  Cranial nerves: Extraocular movements are full.  Facial strength and sensation was normal.  No obvious hearing deficits are noted.  Motor:  Muscle bulk is normal.   Tone is normal. Strength is  5 / 5 in all 4 extremities.   Sensory: Sensory testing is intact to pinprick, soft touch and vibration sensation in all 4 extremities.  Coordination: Cerebellar testing reveals good finger-nose-finger and heel-to-shin bilaterally.  Gait and station: Station is normal.  Gait is mildly wide..  Tandem gait is wide.  Romberg is negative.   Reflexes: Deep tendon reflexes are symmetric and normal bilaterally.        DIAGNOSTIC DATA (LABS, IMAGING, TESTING) - I reviewed patient records, labs, notes, testing and imaging myself where available.  Lab Results  Component Value Date   WBC 7.7 08/26/2020   HGB 13.1 08/26/2020   HCT 41.0 08/26/2020   MCV 93.8 08/26/2020   PLT 306 08/26/2020      Component Value Date/Time   NA 138 08/26/2020 1148   K 5.1 08/26/2020 1148   CL 102 08/26/2020 1148   CO2 24 08/26/2020 1148   GLUCOSE 143 (H) 08/26/2020 1148   BUN 26 (H) 08/26/2020 1148   CREATININE 1.13 (H) 08/26/2020 1148   CALCIUM 9.7 08/26/2020 1148   PROT 6.3 08/26/2020 1148   AST 14 08/26/2020 1148   ALT 9 08/26/2020 1148   BILITOT 0.4 08/26/2020 1148   GFRNONAA 48 (L) 08/26/2020 1148   GFRAA 56 (L) 08/26/2020 1148   Lab Results  Component Value Date   CHOL 111 08/26/2020   HDL 49 (L) 08/26/2020   LDLCALC 39 08/26/2020   TRIG 147 08/26/2020   CHOLHDL 2.3 08/26/2020   Lab Results  Component Value Date   HGBA1C 7.2 (H) 10/03/2020       ASSESSMENT AND PLAN  MS (multiple sclerosis) (HCC)  Gait disturbance  Vertigo  Urinary urgency  Her MS has been stable.  She did have an enhancing lesion in 2012  but the more recent MRIs have not shown any new activity.  We have previously discussed that many patients in  their 47s are able to discontinue the MS medications.  At the last visit we went down to the Avonex dose.  Since she continues to do really well we will discontinue the Avonex.  She is advised to let us know if she has any symptoms concerning for exacerbation.    2.   Continue DDAVP for nocturna urinary urgency/frequency 3.   Letter for insurance 4.   She will return to see [place] in 1 year.      This visit is part of a comprehensive longitudinal care medical relationship regarding the patients primary diagnosis of MS and related concerns.   Eliyah Bazzi A. Epimenio Foot, MD, [facility] 02/25/2023, 3:26 PM Certified in Neurology, Clinical Neurophysiology, Sleep Medicine and Neuroimaging  [facility] [address] [place], [place] [address] [phone]

## 2023-03-09 ENCOUNTER — Ambulatory Visit: Payer: Medicare Other | Admitting: Neurology

## 2023-03-20 ENCOUNTER — Other Ambulatory Visit: Payer: Self-pay | Admitting: Neurology

## 2023-03-22 ENCOUNTER — Encounter: Payer: Self-pay | Admitting: *Deleted

## 2023-03-22 MED ORDER — HYDROCODONE-ACETAMINOPHEN 7.5-325 MG PO TABS: 0.5000 | ORAL_TABLET | Freq: Two times a day (BID) | ORAL | 0 refills | Status: DC | PRN

## 2023-03-22 NOTE — Telephone Encounter (Signed)
Requested Prescriptions   Pending Prescriptions Disp Refills   HYDROcodone-acetaminophen (NORCO) 7.5-325 MG tablet 45 tablet 0    Sig: Take 0.5-1 tablets by mouth 2 (two) times daily as needed for moderate pain (pain score 4-6).   Last seen 02/25/23 Next appt 03/02/24 Dispenses   Dispensed Days Supply Quantity Provider Pharmacy  HYDROCODONE/ACETAMINOPHEN 7.5-325 T 02/17/2023 22 45 each Sater, Pearletha Furl, MD [facility] DR[facility] STORE #...  HYDROCODONE/ACETAMINOPHEN 7.5-325 T 01/19/2023 30 45 each Ocie Doyne, MD [facility] DR[facility] STORE #...  HYDROCODONE/ACETAMINOPHEN 7.5-325 T 12/17/2022 22 45 each Sater, Pearletha Furl, MD [facility] DRUG STORE #...  HYDROCODONE/ACETAMINOPHEN 7.5-325 T 11/16/2022 22 45 each Windell Norfolk, MD [facility] DRUG STORE #...  HYDROCODONE/ACETAMINOPHEN 7.5-325 T 10/17/2022 30 45 each Sater, Pearletha Furl, MD [facility] DRUG STORE #...  HYDROCODONE/ACETAMINOPHEN 7.5-325 T 09/16/2022 22 45 each Sater, Pearletha Furl, MD [facility] DRUG STORE #...  HYDROCODONE/ACETAMINOPHEN 7.5-325 T 08/17/2022 22 45 each Sater, Pearletha Furl, MD [facility] DRUG STORE #...  HYDROCODONE/ACETAMINOPHEN 7.5-325 T 07/16/2022 22 45 each Sater, Pearletha Furl, MD [facility] DRUG STORE #...  HYDROCODONE/ACETAMINOPHEN 7.5-325 T 06/15/2022 22 45 each Sater, Pearletha Furl, MD [facility] DR[facility] STORE #...  HYDROCODONE/ACETAMINOPHEN 7.5-325 T 05/31/2022 2 4 each Sater, Pearletha Furl, MD [facility] DRUG STORE #...  HYDROCODONE/ACETAMINOPHEN 7.5-325 T 05/16/2022 25 41 each Sater, Pearletha Furl, MD [facility] DRUG STORE #...  HYDROCODONE/ACETAMINOPHEN 5-325 TB 05/08/2022 5 20 each [facility] ST[facility] #...  HYDROCODONE/ACETAMINOPHEN 7.5-325 T 04/10/2022 23 45 each Sater, Pearletha Furl, MD [facility] DR[facility] STORE #.Marland KitchenMarland Kitchen

## 2023-04-19 ENCOUNTER — Other Ambulatory Visit: Payer: Self-pay | Admitting: Neurology

## 2023-04-20 ENCOUNTER — Other Ambulatory Visit: Payer: Self-pay

## 2023-04-20 MED ORDER — HYDROCODONE-ACETAMINOPHEN 7.5-325 MG PO TABS: 0.5000 | ORAL_TABLET | Freq: Two times a day (BID) | ORAL | 0 refills | Status: DC | PRN

## 2023-04-20 NOTE — Telephone Encounter (Signed)
Pt last seen 02/25/2023 Upcoming Appointment 03/02/2024  Hydrocodone last filled 03/22/2023 Escript 04/20/2023

## 2023-05-19 ENCOUNTER — Other Ambulatory Visit: Payer: Self-pay | Admitting: Neurology

## 2023-05-19 MED ORDER — HYDROCODONE-ACETAMINOPHEN 7.5-325 MG PO TABS: 0.5000 | ORAL_TABLET | Freq: Two times a day (BID) | ORAL | 0 refills | Status: DC | PRN

## 2023-05-19 NOTE — Telephone Encounter (Signed)
 Pt Last Seen 02/25/2023 (Dr. Godwin Lat) Upcoming Appointment 03/02/2024 (Dr. Godwin Lat)  Hydrocodone  7.5-325 MG Last filled 04/20/2023 Escript for 05/21/2023 (Friday)

## 2023-05-27 ENCOUNTER — Encounter: Payer: Self-pay | Admitting: Neurology

## 2023-05-27 ENCOUNTER — Other Ambulatory Visit: Payer: Self-pay | Admitting: Neurology

## 2023-05-27 MED ORDER — DESMOPRESSIN ACETATE 0.1 MG PO TABS: 0.1000 mg | ORAL_TABLET | Freq: Every day | ORAL | 3 refills | Status: DC

## 2023-06-01 ENCOUNTER — Other Ambulatory Visit: Payer: Self-pay | Admitting: *Deleted

## 2023-06-01 MED ORDER — DESMOPRESSIN ACETATE 0.1 MG PO TABS: 0.1000 mg | ORAL_TABLET | Freq: Every day | ORAL | 3 refills | Status: DC

## 2023-06-17 ENCOUNTER — Other Ambulatory Visit: Payer: Self-pay | Admitting: Neurology

## 2023-06-17 ENCOUNTER — Other Ambulatory Visit: Payer: Self-pay

## 2023-06-17 MED ORDER — HYDROCODONE-ACETAMINOPHEN 7.5-325 MG PO TABS: 0.5000 | ORAL_TABLET | Freq: Two times a day (BID) | ORAL | 0 refills | Status: DC | PRN

## 2023-06-17 NOTE — Telephone Encounter (Signed)
Pt Last Seen 02/25/2023 Upcoming Appointment 03/02/2024  Hydrocodone Last Filled 05/23/2023 Escript 06/17/2023

## 2023-07-16 ENCOUNTER — Other Ambulatory Visit: Payer: Self-pay | Admitting: Family Medicine

## 2023-07-16 DIAGNOSIS — I10 Essential (primary) hypertension: Secondary | ICD-10-CM

## 2023-07-17 ENCOUNTER — Other Ambulatory Visit: Payer: Self-pay | Admitting: Neurology

## 2023-07-19 MED ORDER — HYDROCODONE-ACETAMINOPHEN 7.5-325 MG PO TABS
0.5000 | ORAL_TABLET | Freq: Two times a day (BID) | ORAL | 0 refills | Status: DC | PRN
Start: 2023-07-19 — End: 2023-08-18

## 2023-07-19 NOTE — Telephone Encounter (Signed)
 Last seen 02/25/23 and next f/u 03/02/24. (Cx 07/20/23). Last refilled 06/22/23 #45.

## 2023-07-20 ENCOUNTER — Ambulatory Visit: Payer: Medicare Other | Admitting: Neurology

## 2023-08-18 ENCOUNTER — Other Ambulatory Visit: Payer: Self-pay | Admitting: Neurology

## 2023-08-19 MED ORDER — HYDROCODONE-ACETAMINOPHEN 7.5-325 MG PO TABS: 0.5000 | ORAL_TABLET | Freq: Two times a day (BID) | ORAL | 0 refills | Status: DC | PRN

## 2023-09-17 ENCOUNTER — Other Ambulatory Visit: Payer: Self-pay | Admitting: Neurology

## 2023-09-17 ENCOUNTER — Encounter: Payer: Self-pay | Admitting: Neurology

## 2023-09-19 MED ORDER — HYDROCODONE-ACETAMINOPHEN 7.5-325 MG PO TABS: 0.5000 | ORAL_TABLET | Freq: Two times a day (BID) | ORAL | 0 refills | Status: DC | PRN

## 2023-10-19 ENCOUNTER — Other Ambulatory Visit: Payer: Self-pay

## 2023-10-19 ENCOUNTER — Encounter: Payer: Self-pay | Admitting: Neurology

## 2023-10-19 MED ORDER — HYDROCODONE-ACETAMINOPHEN 7.5-325 MG PO TABS: 0.5000 | ORAL_TABLET | Freq: Two times a day (BID) | ORAL | 0 refills | Status: AC | PRN

## 2023-10-19 NOTE — Telephone Encounter (Signed)
 Pt last Seen 02/25/2023 Upcoming Appointment 03/02/2024  Hydrocodone  Last filled 09/19/2023 Escript 10/19/2023

## 2023-11-01 ENCOUNTER — Telehealth: Payer: Self-pay | Admitting: Neurology

## 2023-11-01 NOTE — Telephone Encounter (Signed)
 Pt requested an appointment as a result of right leg nerve spasms, 1st was 2 mo ago , becoming more frequent. Pt has accepted wait list.

## 2023-11-19 ENCOUNTER — Other Ambulatory Visit: Payer: Self-pay | Admitting: Neurology

## 2023-11-19 ENCOUNTER — Encounter: Payer: Self-pay | Admitting: Neurology

## 2023-11-19 MED ORDER — HYDROCODONE-ACETAMINOPHEN 7.5-325 MG PO TABS: 0.5000 | ORAL_TABLET | Freq: Two times a day (BID) | ORAL | 0 refills | Status: DC | PRN

## 2023-11-30 ENCOUNTER — Encounter: Payer: Self-pay | Admitting: Podiatry

## 2023-11-30 ENCOUNTER — Ambulatory Visit (INDEPENDENT_AMBULATORY_CARE_PROVIDER_SITE_OTHER): Admitting: Podiatry

## 2023-11-30 DIAGNOSIS — M2042 Other hammer toe(s) (acquired), left foot: Secondary | ICD-10-CM | POA: Diagnosis not present

## 2023-11-30 DIAGNOSIS — L84 Corns and callosities: Secondary | ICD-10-CM | POA: Diagnosis not present

## 2023-11-30 DIAGNOSIS — M2041 Other hammer toe(s) (acquired), right foot: Secondary | ICD-10-CM

## 2023-11-30 NOTE — Patient Instructions (Signed)

## 2023-11-30 NOTE — Progress Notes (Unsigned)
 Subjective: Chief Complaint  Patient presents with   Callouses    Callus sub right 2nd toe x 3 weeks. 0 pain. NIDDM A4C 83.13   78 year old female presents the office today with concerns of a skin lesion to the bottom of her right second toe.  Does not cause any pain but being diabetic should have this checked.  No drainage or any open lesions.  She also has calluses to her left foot.  No injuries.  She has developed her foot jumping and a nerve issues. She is following up with her neurologist for this.   ROS: Negative except otherwise mentioned above.  Objective: AAO x3, NAD DP/PT pulses palpable bilaterally, CRT less than 3 seconds Transverse hyperkeratotic lesion of the plantar aspect right second toe on the IPJ.  Also hyperkeratotic lesion along the left hallux as well as a pinch callus of the left fourth toe.  There is no underlying ulceration, drainage or signs of infection. Digital deformities present. No pain with calf compression, swelling, warmth, erythema  Assessment: Hyperkeratotic lesions, digital deformities  Plan: -All treatment options discussed with the patient including all alternatives, risks, complications.  -She open debrided hyperkeratotic lesion with any complications or bleeding.  Discussed offloading.  Monitor for any signs or symptoms infection and/or skin breakdown. -Patient encouraged to call the office with any questions, concerns, change in symptoms.   Return if symptoms worsen or fail to improve.  Sonya Solomon DPM

## 2023-12-09 ENCOUNTER — Ambulatory Visit (INDEPENDENT_AMBULATORY_CARE_PROVIDER_SITE_OTHER): Admitting: Neurology

## 2023-12-09 ENCOUNTER — Encounter: Payer: Self-pay | Admitting: Neurology

## 2023-12-09 VITALS — BP 160/74 | HR 47 | Ht 64.5 in | Wt 197.5 lb

## 2023-12-09 DIAGNOSIS — R42 Dizziness and giddiness: Secondary | ICD-10-CM | POA: Diagnosis not present

## 2023-12-09 DIAGNOSIS — R3915 Urgency of urination: Secondary | ICD-10-CM

## 2023-12-09 DIAGNOSIS — G4700 Insomnia, unspecified: Secondary | ICD-10-CM

## 2023-12-09 DIAGNOSIS — R252 Cramp and spasm: Secondary | ICD-10-CM

## 2023-12-09 DIAGNOSIS — R269 Unspecified abnormalities of gait and mobility: Secondary | ICD-10-CM

## 2023-12-09 DIAGNOSIS — G35 Multiple sclerosis: Secondary | ICD-10-CM

## 2023-12-09 MED ORDER — LEVETIRACETAM 500 MG PO TABS: 500.0000 mg | ORAL_TABLET | Freq: Two times a day (BID) | ORAL | 11 refills | Status: DC

## 2023-12-09 MED ORDER — HYDROCODONE-ACETAMINOPHEN 7.5-325 MG PO TABS: 0.5000 | ORAL_TABLET | Freq: Two times a day (BID) | ORAL | 0 refills | Status: DC | PRN

## 2023-12-09 MED ORDER — DOXEPIN HCL 10 MG PO CAPS: 10.0000 mg | ORAL_CAPSULE | Freq: Every day | ORAL | 3 refills | Status: DC

## 2023-12-09 NOTE — Progress Notes (Signed)
 [facility]  PATIENT: Sonya Solomon DOB: [birth year]  REFERRING DOCTOR OR PCP: Dr. Bari SOURCE: Patient, notes from [facility] neurology, laboratory reports  _________________________________   HISTORICAL  CHIEF COMPLAINT:  Chief Complaint  Patient presents with   Follow-up    Pt in room 11. Husband in room. Here for right leg nerve spasms. Pt said spasm comes and goes, 1st spasm started on 09/08/23.      HISTORY OF PRESENT ILLNESS:  Sonya Solomon is a 78 y.o. woman with multiple sclerosis diagnosed in 2000  Update 12/09/2023 She feels her MS is mostly stable .  She stopped the Avonex  at the last visit.  She had not had a definite exacerbation x 20 years.     Although strength is about the same, she is noting intermittent spasms in her right leg.   She gets a tremor followed by a phasic spasm lasting 2-3 minutes .  Her first spasm  occurred 09/08/2023.  This has occurred 5-7 times since then     Most occur while sitting and one occurred at night.    After it stops, her foot feels numb and is weaker for several additional minutes.    She has mild vertigo similar to  last visit.  This increase swith standing.  Gait is better an she is not using her cane..   She is able to use stairs carefully.    No falls.     She notes mild bilateral leg weakness, left a little more than right.   Some left leg pain as well.   .   She has no numbness but gets stabbing pain in both legs.  This is worse at night in left > right leg but not much in feet.   She takes hydrocodone  most nights.   She has urinary urgency and frequency, worse at night.   DDAVP  0.1 mg has only helped a little..   Solifenacin  did not help much and Mirbetriq wa too expensive..   She feels her vision is doing ok.   She had bilateral cataract surgery in 2020.     She notes fatigue but it does not prevent her from accomplishing tasks.  She is sleeping better with DDAVP  to reduce nocturia.   Mood is doing well.   Cognition is doing well   MS History:   She was diagnosed with MS in 2000 after presenting with diplopia and vertigo.  An MRI was consistent with MS and she was diagnosed with relapsing remitting MS.   She received several days of IV steroid and was placed on Avonex .   She gets intermittent vertigo but has not had ant definite exacerbation.     She has stayed on Avonex  and feels stable.  She takes it every other week and tolerates it well.  She was diagnosed while living in [place] but moved shortly thereafter to [facility] and has been seen Thomasina Alas most of that time  IMAGING:   MRI brain report from 11/16/2010 states that there were 3 new small foci of plaque enhancement in the right cerebral hemisphere and resolution of enhancement of a plaque in the right middle cerebellar peduncle that had been seen on a previous MRI (date unknown).  Additionally there are multiple chronic foci in the supratentorial and infratentorial white matter consistent with MS..      MRI brain 04/20/2020 showed discrete T2/flair hyperintense foci in the spinal cord adjacent to C2, left middle cerebellar peduncle, right medial cerebellar hemisphere.  There are single and confluent foci in the periventricular white matter of the hemispheres and discrete foci in the juxtacortical and deep white matter of the hemispheres.  None of the foci appear to be acute.  I compared the brain MRI from 11/06/2010 to the recent one 04/20/2020 --- she does show significant progression over that time.   MRI in 2012 showed 3 enhancing lesions  . At her request 2021 MRI was without contrast.  Vitamin D was normal (52) in 2017     REVIEW OF SYSTEMS: Constitutional: No fevers, chills, sweats, or change in appetite Eyes: No visual changes, double vision, eye pain Ear, nose and throat: No hearing loss, ear pain, nasal congestion, sore throat Cardiovascular: No chest pain, palpitations Respiratory:  No shortness of breath at rest or with  exertion.   No wheezes GastrointestinaI: No nausea, vomiting, diarrhea, abdominal pain, fecal incontinence Genitourinary:  No dysuria, urinary retention or frequency.  No nocturia. Musculoskeletal:  No neck pain, back pain Integumentary: No rash, pruritus, skin lesions Neurological: as above Psychiatric: No depression at this time.  No anxiety Endocrine: No palpitations, diaphoresis, change in appetite, change in weigh or increased thirst Hematologic/Lymphatic:  No anemia, purpura, petechiae. Allergic/Immunologic: No itchy/runny eyes, nasal congestion, recent allergic reactions, rashes  ALLERGIES: Allergies  Allergen Reactions   Latex Rash    HOME MEDICATIONS:  Current Outpatient Medications:    desmopressin  (DDAVP ) 0.1 MG tablet, Take 1 tablet (0.1 mg total) by mouth at bedtime., Disp: 90 tablet, Rfl: 3   doxepin  (SINEQUAN ) 10 MG capsule, Take 1 capsule (10 mg total) by mouth at bedtime., Disp: 90 capsule, Rfl: 3   levETIRAcetam  (KEPPRA ) 500 MG tablet, Take 1 tablet (500 mg total) by mouth 2 (two) times daily., Disp: 60 tablet, Rfl: 11   lisinopril  (ZESTRIL ) 10 MG tablet, TAKE 1 TABLET(10 MG) BY MOUTH DAILY, Disp: 90 tablet, Rfl: 3   loperamide  (IMODIUM ) 2 MG capsule, Take 1 capsule (2 mg total) by mouth as needed for diarrhea or loose stools., Disp: 30 capsule, Rfl: 3   metFORMIN  (GLUCOPHAGE ) 1000 MG tablet, TAKE 1 TABLET(1000 MG) BY MOUTH TWICE DAILY WITH A MEAL, Disp: 180 tablet, Rfl: 1   omeprazole (PRILOSEC) 20 MG capsule, Take 20 mg by mouth 2 (two) times daily., Disp: , Rfl:    pioglitazone  (ACTOS ) 45 MG tablet, Take 1 tablet (45 mg total) by mouth daily., Disp: 90 tablet, Rfl: 3   Semaglutide , 1 MG/DOSE, (OZEMPIC , 1 MG/DOSE,) 2 MG/1.5ML SOPN, Inject 1 mg into the skin once a week., Disp: 4.5 mL, Rfl: 3   simvastatin  (ZOCOR ) 40 MG tablet, TAKE 1 TABLET(40 MG) BY MOUTH DAILY, Disp: 90 tablet, Rfl: 0   HYDROcodone -acetaminophen  (NORCO) 7.5-325 MG tablet, Take 0.5-1 tablets by  mouth 2 (two) times daily as needed., Disp: 45 tablet, Rfl: 0   Multiple Vitamin (MULTIVITAMIN) capsule, Take 1 capsule by mouth daily., Disp: , Rfl:   PAST MEDICAL HISTORY: Past Medical History:  Diagnosis Date   Diabetes mellitus without complication (HCC)    MS (multiple sclerosis) (HCC)     PAST SURGICAL HISTORY: Past Surgical History:  Procedure Laterality Date   ABDOMINAL HYSTERECTOMY     APPENDECTOMY     BOWEL RESECTION  1996   BREAST REDUCTION SURGERY  07/10/1986   CATARACT EXTRACTION, BILATERAL     12/21/19, 09/19.21   CHOLECYSTECTOMY  05/2004   DE QUERVAIN'S RELEASE  05/18/2014   RADIAL STYLOIDECTOMY WRIST Left 05/10/2012   REDUCTION MAMMAPLASTY     TRIGGER  FINGER RELEASE Left 01/09/2011   UMBILICAL HERNIA REPAIR  08/10/2006    FAMILY HISTORY: Family History  Problem Relation Age of Onset   Breast cancer Mother    Heart attack Father     SOCIAL HISTORY:  Social History   Socioeconomic History   Marital status: Married    Spouse name: Not on file   Number of children: Not on file   Years of education: Not on file   Highest education level: Not on file  Occupational History   Not on file  Tobacco Use   Smoking status: Never   Smokeless tobacco: Never  Vaping Use   Vaping status: Never Used  Substance and Sexual Activity   Alcohol use: Never   Drug use: Never   Sexual activity: Not Currently  Other Topics Concern   Not on file  Social History Narrative   Lives with husband   Caffeine use: 2 cups per day   Right handed    Social Drivers of Health   Financial Resource Strain: Not on file  Food Insecurity: Not on file  Transportation Needs: Not on file  Physical Activity: Not on file  Stress: Not on file  Social Connections: Not on file  Intimate Partner Violence: Not on file     PHYSICAL EXAM  Vitals:   12/09/23 1447 12/09/23 1451  BP: (!) 166/79 (!) 160/74  Pulse: (!) 47   SpO2: 100%   Weight: 197 lb 8 oz (89.6 kg)   Height: 5'  4.5 (1.638 m)      Body mass index is 33.38 kg/m.   General: The patient is well-developed and well-nourished and in no acute distress  HEENT:  Head is [place]/AT.  Sclera are anicteric.    Skin: Extremities are without rash or  edema.  Neurologic Exam  Mental status: The patient is alert and oriented x 3 at the time of the examination. The patient has apparent normal recent and remote memory, with an apparently normal attention span and concentration ability.   Speech is normal.  Cranial nerves: Extraocular movements are full.  Facial strength and sensation was normal.  No obvious hearing deficits are noted.  Motor:  Muscle bulk is normal.   Tone is normal. Strength is  5 / 5 in all 4 extremities.   Sensory: Sensory testing is intact to pinprick, soft touch and vibration sensation in all 4 extremities.  Coordination: Cerebellar testing reveals good finger-nose-finger and heel-to-shin bilaterally.  Gait and station: Station is normal.  Gait is wide and has a right foot drop, much better with cane. .. Cannot tandem walk.  The Romberg is negative. Reflexes: Deep tendon reflexes are symmetric and normal bilaterally.        DIAGNOSTIC DATA (LABS, IMAGING, TESTING) - I reviewed patient records, labs, notes, testing and imaging myself where available.  Lab Results  Component Value Date   WBC 7.7 08/26/2020   HGB 13.1 08/26/2020   HCT 41.0 08/26/2020   MCV 93.8 08/26/2020   PLT 306 08/26/2020      Component Value Date/Time   NA 138 08/26/2020 1148   K 5.1 08/26/2020 1148   CL 102 08/26/2020 1148   CO2 24 08/26/2020 1148   GLUCOSE 143 (H) 08/26/2020 1148   BUN 26 (H) 08/26/2020 1148   CREATININE 1.13 (H) 08/26/2020 1148   CALCIUM 9.7 08/26/2020 1148   PROT 6.3 08/26/2020 1148   AST 14 08/26/2020 1148   ALT 9 08/26/2020 1148   BILITOT 0.4 08/26/2020  1148   GFRNONAA 48 (L) 08/26/2020 1148   GFRAA 56 (L) 08/26/2020 1148   Lab Results  Component Value Date   CHOL 111  08/26/2020   HDL 49 (L) 08/26/2020   LDLCALC 39 08/26/2020   TRIG 147 08/26/2020   CHOLHDL 2.3 08/26/2020   Lab Results  Component Value Date   HGBA1C 7.2 (H) 10/03/2020       ASSESSMENT AND PLAN  MS (multiple sclerosis) (HCC)  Gait disturbance  Vertigo  Urinary urgency  Spasticity  Insomnia, unspecified type  She will stay off of a DMT.     She is advised to let us  know if she has any symptoms concerning for exacerbation.   If occurring we will need to reimage to determine if there is breakthrough activity off the Avonex  2.   Continue DDAVP  for nocturna urinary urgency/frequency 3.   Levetiracetam  for phasic spasms.  If not better consider tizanidine or baclofen   If spasms worsen will check cervical and thoracic MRI  4.   Doxepin  10 mg po qH for sleep maintenance insomnia.  She will return to see us  in 8-9 months.      This visit is part of a comprehensive longitudinal care medical relationship regarding the patients primary diagnosis of MS and related concerns.   Janson Lamar A. Vear, MD, Teola RENO 12/09/2023, 3:24 PM Certified in Neurology, Clinical Neurophysiology, Sleep Medicine and Neuroimaging  [facility] [address], [address] [place], [place] [address] [phone]

## 2023-12-26 ENCOUNTER — Encounter: Payer: Self-pay | Admitting: Neurology

## 2023-12-27 ENCOUNTER — Other Ambulatory Visit: Payer: Self-pay | Admitting: Neurology

## 2023-12-27 MED ORDER — BACLOFEN 10 MG PO TABS: ORAL_TABLET | ORAL | 5 refills | Status: DC

## 2023-12-30 ENCOUNTER — Other Ambulatory Visit: Payer: Self-pay | Admitting: Neurology

## 2023-12-30 MED ORDER — TIZANIDINE HCL 2 MG PO TABS: 2.0000 mg | ORAL_TABLET | Freq: Three times a day (TID) | ORAL | 5 refills | Status: DC | PRN

## 2024-01-17 ENCOUNTER — Encounter: Payer: Self-pay | Admitting: Neurology

## 2024-01-17 ENCOUNTER — Telehealth: Payer: Self-pay

## 2024-01-17 MED ORDER — HYDROCODONE-ACETAMINOPHEN 7.5-325 MG PO TABS: 0.5000 | ORAL_TABLET | Freq: Two times a day (BID) | ORAL | 0 refills | Status: DC | PRN

## 2024-01-17 NOTE — Telephone Encounter (Signed)
 Pt last seen 12/09/2023 Upcoming Appointment 08/09/2024  Hydrocodone  Last Filled 12/09/2023

## 2024-01-26 ENCOUNTER — Encounter: Payer: Self-pay | Admitting: Neurology

## 2024-01-27 NOTE — Telephone Encounter (Signed)
 I would recommend to continue with the Tizanidine . Next week, upon Dr. Vear return, he will determine if scans are appropriate.

## 2024-01-31 ENCOUNTER — Other Ambulatory Visit: Payer: Self-pay | Admitting: Neurology

## 2024-01-31 DIAGNOSIS — M62838 Other muscle spasm: Secondary | ICD-10-CM

## 2024-01-31 DIAGNOSIS — R269 Unspecified abnormalities of gait and mobility: Secondary | ICD-10-CM

## 2024-02-01 ENCOUNTER — Telehealth: Payer: Self-pay | Admitting: Neurology

## 2024-02-01 NOTE — Telephone Encounter (Signed)
 no auth required sent to [facility] [phone]

## 2024-02-02 NOTE — Telephone Encounter (Signed)
 She is not able to transport herself and needs assistance to get into the MRI table, so I changed the orders to [facility] and they will call her to schedule. [phone]

## 2024-02-03 LAB — LAB REPORT - SCANNED
A1c: 7
Creatinine, POC: 234 mg/dL
EGFR: 65
HM Hepatitis Screen: NEGATIVE
Microalb Creat Ratio: 21.8
Microalbumin, Urine: 5.1

## 2024-02-09 ENCOUNTER — Ambulatory Visit (HOSPITAL_COMMUNITY)

## 2024-02-14 ENCOUNTER — Encounter: Payer: Self-pay | Admitting: Neurology

## 2024-02-14 MED ORDER — HYDROCODONE-ACETAMINOPHEN 7.5-325 MG PO TABS: 0.5000 | ORAL_TABLET | Freq: Two times a day (BID) | ORAL | 0 refills | Status: DC | PRN

## 2024-02-14 NOTE — Telephone Encounter (Signed)
 Last seen on 12/09/23 Follow up scheduled on 08/09/24   Dispensed Days Supply Quantity Provider Pharmacy  HYDROCODONE /ACETAMINOPHEN  7.5-325 T 01/17/2024 22 45 each Sater, Charlie LABOR, MD [facility]...   Rx pending to be signed

## 2024-02-17 ENCOUNTER — Ambulatory Visit (HOSPITAL_COMMUNITY)
Admission: RE | Admit: 2024-02-17 | Discharge: 2024-02-17 | Disposition: A | Discharge status: Home / Self Care | Source: Ambulatory Visit | Attending: Neurology | Admitting: Neurology

## 2024-02-17 DIAGNOSIS — R269 Unspecified abnormalities of gait and mobility: Secondary | ICD-10-CM | POA: Insufficient documentation

## 2024-02-17 DIAGNOSIS — G35D Multiple sclerosis, unspecified: Secondary | ICD-10-CM | POA: Diagnosis present

## 2024-02-17 DIAGNOSIS — M62838 Other muscle spasm: Secondary | ICD-10-CM

## 2024-02-23 ENCOUNTER — Ambulatory Visit: Payer: Self-pay | Admitting: Neurology

## 2024-02-24 ENCOUNTER — Telehealth: Payer: Self-pay | Admitting: Neurology

## 2024-02-24 NOTE — Telephone Encounter (Signed)
 I called Sonya Solomon to go over the results of the MRI of the cervical and thoracic spine.  There was no demyelinating plaque noted within the spinal cord.  At C5-C6, there is a disc protrusion slightly to the right causing mild spinal stenosis and mild right foraminal narrowing but no nerve root or spinal cord compression.  This is probably asymptomatic  In the thoracic spine, adjacent to T11, there appears to be an ovoid intradural extramedullary soft tissue density.  This is nonspecific, but could represent meningioma, nerve sheath tumor or sequestered disc fragment.   To further evaluate, we could check an MRI of the thoracic spine with contrast which would help to differentiate between these possibilities.  She was upset that the study was not ordered with contrast to begin with and I discussed that we generally try to reduce the amount of contrast that a person is exposed to to help lower her kidney risk.  There does not appear to be spinal cord compression so this would be either asymptomatic or cause pain in the left hip region but be unlikely to cause the symptoms that led to the MRI (spasms in the right leg and dragging of the right leg).  If she prefers to wait to do the MRI, we can do a contrasted study in 6 to 12 months (would order with and without if done sometime next year) as I would want to do a comparison at some point in the future anyhow.  She is currently on tizanidine .  Other medications, such as levetiracetam , could be considered if symptoms persist

## 2024-02-28 ENCOUNTER — Emergency Department (HOSPITAL_COMMUNITY): Admission: EM | Admit: 2024-02-28 | Discharge: 2024-02-29 | Disposition: A

## 2024-02-28 ENCOUNTER — Other Ambulatory Visit: Payer: Self-pay

## 2024-02-28 DIAGNOSIS — Z7984 Long term (current) use of oral hypoglycemic drugs: Secondary | ICD-10-CM | POA: Diagnosis not present

## 2024-02-28 DIAGNOSIS — Z91040 Latex allergy status: Secondary | ICD-10-CM | POA: Insufficient documentation

## 2024-02-28 DIAGNOSIS — M62838 Other muscle spasm: Secondary | ICD-10-CM | POA: Insufficient documentation

## 2024-02-28 DIAGNOSIS — E119 Type 2 diabetes mellitus without complications: Secondary | ICD-10-CM | POA: Diagnosis not present

## 2024-02-28 LAB — I-STAT CHEM 8, ED
BUN: 27 mg/dL — ABNORMAL HIGH (ref 8–23)
Calcium, Ion: 1.27 mmol/L (ref 1.15–1.40)
Chloride: 105 mmol/L (ref 98–111)
Creatinine, Ser: 1 mg/dL (ref 0.44–1.00)
Glucose, Bld: 123 mg/dL — ABNORMAL HIGH (ref 70–99)
HCT: 41 % (ref 36.0–46.0)
Hemoglobin: 13.9 g/dL (ref 12.0–15.0)
Potassium: 4.9 mmol/L (ref 3.5–5.1)
Sodium: 138 mmol/L (ref 135–145)
TCO2: 22 mmol/L (ref 22–32)

## 2024-02-28 LAB — COMPREHENSIVE METABOLIC PANEL WITH GFR
ALT: 15 U/L (ref 0–44)
AST: 22 U/L (ref 15–41)
Albumin: 4 g/dL (ref 3.5–5.0)
Alkaline Phosphatase: 41 U/L (ref 38–126)
Anion gap: 13 (ref 5–15)
BUN: 25 mg/dL — ABNORMAL HIGH (ref 8–23)
CO2: 21 mmol/L — ABNORMAL LOW (ref 22–32)
Calcium: 9.6 mg/dL (ref 8.9–10.3)
Chloride: 104 mmol/L (ref 98–111)
Creatinine, Ser: 1.13 mg/dL — ABNORMAL HIGH (ref 0.44–1.00)
GFR, Estimated: 50 mL/min — ABNORMAL LOW (ref >60)
Glucose, Bld: 133 mg/dL — ABNORMAL HIGH (ref 70–99)
Potassium: 5 mmol/L (ref 3.5–5.1)
Sodium: 138 mmol/L (ref 135–145)
Total Bilirubin: 0.7 mg/dL (ref 0.0–1.2)
Total Protein: 6.7 g/dL (ref 6.5–8.1)

## 2024-02-28 LAB — CBC WITH DIFFERENTIAL/PLATELET
Abs Immature Granulocytes: 0.01 K/uL (ref 0.00–0.07)
Basophils Absolute: 0 K/uL (ref 0.0–0.1)
Basophils Relative: 0 %
Eosinophils Absolute: 0.1 K/uL (ref 0.0–0.5)
Eosinophils Relative: 1 %
HCT: 40.7 % (ref 36.0–46.0)
Hemoglobin: 13.1 g/dL (ref 12.0–15.0)
Immature Granulocytes: 0 %
Lymphocytes Relative: 22 %
Lymphs Abs: 1.9 K/uL (ref 0.7–4.0)
MCH: 28.9 pg (ref 26.0–34.0)
MCHC: 32.2 g/dL (ref 30.0–36.0)
MCV: 89.8 fL (ref 80.0–100.0)
Monocytes Absolute: 0.6 K/uL (ref 0.1–1.0)
Monocytes Relative: 7 %
Neutro Abs: 5.9 K/uL (ref 1.7–7.7)
Neutrophils Relative %: 70 %
Platelets: 269 K/uL (ref 150–400)
RBC: 4.53 MIL/uL (ref 3.87–5.11)
RDW: 14.7 % (ref 11.5–15.5)
WBC: 8.5 K/uL (ref 4.0–10.5)
nRBC: 0 % (ref 0.0–0.2)

## 2024-02-28 LAB — I-STAT CG4 LACTIC ACID, ED: Lactic Acid, Venous: 2.4 mmol/L (ref 0.5–1.9)

## 2024-02-28 LAB — CK: Total CK: 55 U/L (ref 38–234)

## 2024-02-28 MED ORDER — LEVETIRACETAM 500 MG PO TABS
1000.0000 mg | ORAL_TABLET | Freq: Once | ORAL | Status: AC
  Administered 2024-02-28: 1000 mg via ORAL
  Filled 2024-02-28: qty 2

## 2024-02-28 MED ORDER — LACTATED RINGERS IV BOLUS
1000.0000 mL | Freq: Once | INTRAVENOUS | Status: AC
  Administered 2024-02-28: 1000 mL via INTRAVENOUS

## 2024-02-28 MED ORDER — LEVETIRACETAM 750 MG PO TABS
750.0000 mg | ORAL_TABLET | Freq: Two times a day (BID) | ORAL | 0 refills | Status: DC
Start: 2024-02-28 — End: 2024-03-29

## 2024-02-28 NOTE — ED Provider Notes (Signed)
 [place] EMERGENCY DEPARTMENT AT [facility] Provider Note   CSN: [id number] Arrival date & time: 02/28/24  2034     Patient presents with: Spasms   Sonya Solomon is a 78 y.o. female.   This is a 78 year old female presenting emergency department with leg spasms.  Has a history of MS.  Reports that her right leg has essentially stopped functioning since this past May to the point where she is unable to ambulate.  She has been seeing a neurologist outpatient and has been worked up with MRIs.  On Keppra .  She notes that she has had these episodic leg jerking/spasming.  They have been happening more frequently.  Happened 3 times today.  She notes that when they do happen she has some difficulty finding her words.  Denies headache, vision loss, weakness in her upper extremities or left lower extremity.        Prior to Admission medications   Medication Sig Start Date End Date Taking? Authorizing Provider  desmopressin  (DDAVP ) 0.1 MG tablet Take 1 tablet (0.1 mg total) by mouth at bedtime. 06/01/23   Sater, Charlie LABOR, MD  doxepin  (SINEQUAN ) 10 MG capsule Take 1 capsule (10 mg total) by mouth at bedtime. 12/09/23   Sater, Charlie LABOR, MD  HYDROcodone -acetaminophen  (NORCO) 7.5-325 MG tablet Take 0.5-1 tablets by mouth 2 (two) times daily as needed. 02/14/24   Sater, Charlie LABOR, MD  levETIRAcetam  (KEPPRA ) 500 MG tablet Take 1 tablet (500 mg total) by mouth 2 (two) times daily. 12/09/23   Sater, Charlie LABOR, MD  lisinopril  (ZESTRIL ) 10 MG tablet TAKE 1 TABLET(10 MG) BY MOUTH DAILY 07/13/22   Duanne Butler DASEN, MD  loperamide  (IMODIUM ) 2 MG capsule Take 1 capsule (2 mg total) by mouth as needed for diarrhea or loose stools. 05/10/20   Bari Theodoro FALCON, MD  metFORMIN  (GLUCOPHAGE ) 1000 MG tablet TAKE 1 TABLET(1000 MG) BY MOUTH TWICE DAILY WITH A MEAL 04/29/21   Chandra Harlene LABOR, NP  omeprazole (PRILOSEC) 20 MG capsule Take 20 mg by mouth 2 (two) times daily. 07/07/21   [provider]   pioglitazone  (ACTOS ) 45 MG tablet Take 1 tablet (45 mg total) by mouth daily. 01/27/21   Chandra Harlene LABOR, NP  Semaglutide , 1 MG/DOSE, (OZEMPIC , 1 MG/DOSE,) 2 MG/1.5ML SOPN Inject 1 mg into the skin once a week. 08/05/20   Chandra Harlene LABOR, NP  simvastatin  (ZOCOR ) 40 MG tablet TAKE 1 TABLET(40 MG) BY MOUTH DAILY 08/11/21   Duanne Butler DASEN, MD  tiZANidine  (ZANAFLEX ) 2 MG tablet Take 1 tablet (2 mg total) by mouth every 8 (eight) hours as needed for muscle spasms. 12/30/23   Sater, Charlie LABOR, MD    Allergies: Latex    Review of Systems  Updated Vital Signs BP 135/72   Pulse 64   Temp 97.6 F (36.4 C) (Oral)   SpO2 100%   Physical Exam Vitals and nursing note reviewed.  Constitutional:      General: She is not in acute distress.    Appearance: She is not toxic-appearing.  HENT:     Head: Normocephalic.     Nose: Nose normal.     Mouth/Throat:     Mouth: Mucous membranes are moist.  Eyes:     Conjunctiva/sclera: Conjunctivae normal.  Cardiovascular:     Rate and Rhythm: Normal rate and regular rhythm.  Pulmonary:     Effort: Pulmonary effort is normal.     Breath sounds: Normal breath sounds.  Abdominal:  General: Abdomen is flat. There is no distension.     Palpations: Abdomen is soft.     Tenderness: There is no abdominal tenderness. There is no guarding or rebound.  Musculoskeletal:        General: Normal range of motion.  Skin:    General: Skin is warm and dry.     Capillary Refill: Capillary refill takes less than 2 seconds.  Neurological:     Mental Status: She is alert.     Comments: Normal sensation in her bilateral lower extremities.  Right lower extremity with a flicker of movement with plantarflexion, no dorsiflexion unable to lift her leg up off the bed.  She has equal pulses.  Cranial nerves intact.  Good strength in upper extremities.  Psychiatric:        Mood and Affect: Mood normal.        Behavior: Behavior normal.     (all labs ordered are  listed, but only abnormal results are displayed) Labs Reviewed  COMPREHENSIVE METABOLIC PANEL WITH GFR - Abnormal; Notable for the following components:      Result Value   CO2 21 (*)    Glucose, Bld 133 (*)    BUN 25 (*)    Creatinine, Ser 1.13 (*)    GFR, Estimated 50 (*)    All other components within normal limits  I-STAT CG4 LACTIC ACID, ED - Abnormal; Notable for the following components:   Lactic Acid, Venous 2.4 (*)    All other components within normal limits  I-STAT CHEM 8, ED - Abnormal; Notable for the following components:   BUN 27 (*)    Glucose, Bld 123 (*)    All other components within normal limits  CBC WITH DIFFERENTIAL/PLATELET  CK    EKG: EKG Interpretation Date/Time:  Monday February 28 2024 22:07:10 EDT Ventricular Rate:  66 PR Interval:  103 QRS Duration:  199 QT Interval:  472 QTC Calculation: 495 R Axis:   67  Text Interpretation: Sinus or ectopic atrial rhythm Ventricular trigeminy Short PR interval Nonspecific intraventricular conduction delay Inferior infarct, acute (RCA) Probable RV involvement, suggest recording right precordial leads Confirmed by Neysa Clap [phone]) on 02/28/2024 11:32:11 PM  Radiology: No results found.   Procedures   Medications Ordered in the ED  lactated ringers bolus 1,000 mL (has no administration in time range)  levETIRAcetam  (KEPPRA ) tablet 1,000 mg (1,000 mg Oral Given 02/28/24 2240)    Clinical Course as of 02/28/24 2339  Mon Feb 28, 2024  2111 Insomnia, unspecified type   1. She will stay off of a DMT.     She is advised to let us  know if she has any symptoms concerning for exacerbation.   If occurring we will need to reimage to determine if there is breakthrough activity off the Avonex  2.   Continue DDAVP  for nocturna urinary urgency/frequency 3.   Levetiracetam  for phasic spasms.  If not better consider tizanidine  or baclofen    If spasms worsen will check cervical and thoracic MRI  4.   Doxepin  10 mg po  qH for sleep maintenance insomnia.  She will return to see us  in 8-9 months  [TY]  2158 Spoke with neurology regarding patient's presentation.  They reviewed the patient's chart and imaging.  Notes that patient is on Keppra .  Symptoms likely from phasic spasms secondary to her MS.  Recommending increasing Keppra .  Give a 1 g dose tonight.  Then increasing to 750 twice daily. [TY]  2244 Lactic Acid,  Venous(!!): 2.4 Will give IVFs [TY]  2331 CBC with Differential No leukocytosis to suggest infectious process.  No anemia [TY]  2331 Comprehensive metabolic panel(!) No metabolic derangements.  Baseline renal function. [TY]  2331 CK Total: 55 Rhabdo unlikely [TY]    Clinical Course User Index [TY] Neysa Caron PARAS, DO                                 Medical Decision Making This is a 78 year old female with complex past medical history to include diabetes, MS presenting the emergency department for leg spasm.  Afebrile nontachycardic normotensive.  Physical exam with near flaccid right lower extremity, but sensation intact.  Good pulses.  This is seemingly a chronic issue being worked up by neurology.  Being treated with Keppra  as well as tizanidine .  Has had recent MRIs of her cervical and thoracic spine.  Case discussed with neurology here at [facility]; see ED course.  Patient was given Keppra  at neurology's recommendation.  Her labs as noted in the ED course largely reassuring.  Slightly elevated lactate, but received IV fluids.  Suspect secondary to dehydration.  Will discharge in stable condition.  Patient to follow-up with your neurologist.  Amount and/or Complexity of Data Reviewed Labs: ordered. Decision-making details documented in ED Course. ECG/medicine tests: ordered.  Risk Prescription drug management.       Final diagnoses:  None    ED Discharge Orders     None          Neysa Caron PARAS, DO 02/28/24 2340

## 2024-02-28 NOTE — ED Triage Notes (Signed)
 BIB [facility] from home for MS flare up & leg spasms . A&O status has decreased.

## 2024-02-28 NOTE — Discharge Instructions (Signed)
 We are changing your dose of Keppra .  Please take 750 mg twice a day.  Please follow-up with your neurologist soon as possible.  Return for fevers, chills, severe headache, facial droop, vision loss, worsening symptoms, chest pain, difficulty breathing, uncontrolled nausea vomiting or any new or worsening symptoms that are concerning to you.

## 2024-02-29 DIAGNOSIS — M62838 Other muscle spasm: Secondary | ICD-10-CM | POA: Diagnosis not present

## 2024-02-29 NOTE — ED Notes (Signed)
 [facility] called

## 2024-02-29 NOTE — ED Notes (Signed)
 [facility] arrived to transport patient home at this time. Patient's husband is home and ready to receive patient.

## 2024-02-29 NOTE — ED Notes (Signed)
 Patient pending discharge for completion of fluid bolus.

## 2024-03-02 ENCOUNTER — Encounter: Payer: Self-pay | Admitting: Neurology

## 2024-03-02 ENCOUNTER — Ambulatory Visit: Payer: Medicare Other | Admitting: Neurology

## 2024-03-02 ENCOUNTER — Other Ambulatory Visit: Payer: Self-pay | Admitting: Neurology

## 2024-03-02 DIAGNOSIS — R29818 Other symptoms and signs involving the nervous system: Secondary | ICD-10-CM

## 2024-03-02 DIAGNOSIS — G35C1 Active secondary progressive multiple sclerosis: Secondary | ICD-10-CM

## 2024-03-02 DIAGNOSIS — R253 Fasciculation: Secondary | ICD-10-CM

## 2024-03-02 MED ORDER — LEVETIRACETAM 750 MG PO TABS: 750.0000 mg | ORAL_TABLET | Freq: Two times a day (BID) | ORAL | 3 refills | Status: AC

## 2024-03-02 NOTE — Telephone Encounter (Deleted)
 Per chart patient was placed on LevETIRAcetam  (KEPPRA ) 750 MG tablet  by hospitalist in ED. Per [facility] they are requesting a 90day supply.   Would you be able to send this in?  Thank you!

## 2024-03-16 ENCOUNTER — Encounter: Payer: Self-pay | Admitting: Neurology

## 2024-03-16 ENCOUNTER — Other Ambulatory Visit: Payer: Self-pay | Admitting: Neurology

## 2024-03-16 MED ORDER — HYDROCODONE-ACETAMINOPHEN 7.5-325 MG PO TABS: 0.5000 | ORAL_TABLET | Freq: Two times a day (BID) | ORAL | 0 refills | Status: DC | PRN

## 2024-04-12 ENCOUNTER — Encounter: Payer: Self-pay | Admitting: Neurology

## 2024-04-13 NOTE — Telephone Encounter (Signed)
 From last OV:    Last fills:

## 2024-04-14 MED ORDER — HYDROCODONE-ACETAMINOPHEN 7.5-325 MG PO TABS: 0.5000 | ORAL_TABLET | Freq: Two times a day (BID) | ORAL | 0 refills | Status: DC | PRN

## 2024-04-30 ENCOUNTER — Other Ambulatory Visit: Payer: Self-pay

## 2024-04-30 ENCOUNTER — Emergency Department (HOSPITAL_COMMUNITY)

## 2024-04-30 ENCOUNTER — Inpatient Hospital Stay (HOSPITAL_COMMUNITY)
Admission: EM | Admit: 2024-04-30 | Discharge: 2024-05-26 | DRG: 166 | Disposition: A | Discharge status: Skilled Nursing Facility | Attending: Internal Medicine | Admitting: Internal Medicine

## 2024-04-30 ENCOUNTER — Encounter (HOSPITAL_COMMUNITY): Payer: Self-pay

## 2024-04-30 DIAGNOSIS — B9620 Unspecified Escherichia coli [E. coli] as the cause of diseases classified elsewhere: Secondary | ICD-10-CM | POA: Diagnosis present

## 2024-04-30 DIAGNOSIS — W19XXXA Unspecified fall, initial encounter: Secondary | ICD-10-CM | POA: Diagnosis present

## 2024-04-30 DIAGNOSIS — I493 Ventricular premature depolarization: Secondary | ICD-10-CM | POA: Diagnosis not present

## 2024-04-30 DIAGNOSIS — E871 Hypo-osmolality and hyponatremia: Secondary | ICD-10-CM | POA: Diagnosis not present

## 2024-04-30 DIAGNOSIS — E785 Hyperlipidemia, unspecified: Secondary | ICD-10-CM | POA: Diagnosis not present

## 2024-04-30 DIAGNOSIS — J90 Pleural effusion, not elsewhere classified: Secondary | ICD-10-CM | POA: Diagnosis not present

## 2024-04-30 DIAGNOSIS — E875 Hyperkalemia: Secondary | ICD-10-CM | POA: Diagnosis present

## 2024-04-30 DIAGNOSIS — G40909 Epilepsy, unspecified, not intractable, without status epilepticus: Secondary | ICD-10-CM | POA: Diagnosis present

## 2024-04-30 DIAGNOSIS — H9193 Unspecified hearing loss, bilateral: Secondary | ICD-10-CM | POA: Diagnosis present

## 2024-04-30 DIAGNOSIS — K5730 Diverticulosis of large intestine without perforation or abscess without bleeding: Secondary | ICD-10-CM | POA: Diagnosis present

## 2024-04-30 DIAGNOSIS — R258 Other abnormal involuntary movements: Secondary | ICD-10-CM | POA: Diagnosis not present

## 2024-04-30 DIAGNOSIS — Z7189 Other specified counseling: Secondary | ICD-10-CM | POA: Diagnosis not present

## 2024-04-30 DIAGNOSIS — E7849 Other hyperlipidemia: Secondary | ICD-10-CM | POA: Diagnosis present

## 2024-04-30 DIAGNOSIS — Z91199 Patient's noncompliance with other medical treatment and regimen due to unspecified reason: Secondary | ICD-10-CM

## 2024-04-30 DIAGNOSIS — G9389 Other specified disorders of brain: Secondary | ICD-10-CM | POA: Diagnosis not present

## 2024-04-30 DIAGNOSIS — E041 Nontoxic single thyroid nodule: Secondary | ICD-10-CM | POA: Diagnosis present

## 2024-04-30 DIAGNOSIS — Z7985 Long-term (current) use of injectable non-insulin antidiabetic drugs: Secondary | ICD-10-CM

## 2024-04-30 DIAGNOSIS — I2489 Other forms of acute ischemic heart disease: Secondary | ICD-10-CM | POA: Diagnosis present

## 2024-04-30 DIAGNOSIS — E1169 Type 2 diabetes mellitus with other specified complication: Secondary | ICD-10-CM | POA: Diagnosis present

## 2024-04-30 DIAGNOSIS — J9811 Atelectasis: Secondary | ICD-10-CM | POA: Diagnosis present

## 2024-04-30 DIAGNOSIS — N1831 Chronic kidney disease, stage 3a: Secondary | ICD-10-CM | POA: Diagnosis present

## 2024-04-30 DIAGNOSIS — G35D Multiple sclerosis, unspecified: Secondary | ICD-10-CM | POA: Diagnosis present

## 2024-04-30 DIAGNOSIS — E1122 Type 2 diabetes mellitus with diabetic chronic kidney disease: Secondary | ICD-10-CM | POA: Diagnosis present

## 2024-04-30 DIAGNOSIS — R197 Diarrhea, unspecified: Secondary | ICD-10-CM | POA: Diagnosis not present

## 2024-04-30 DIAGNOSIS — Z8249 Family history of ischemic heart disease and other diseases of the circulatory system: Secondary | ICD-10-CM

## 2024-04-30 DIAGNOSIS — E860 Dehydration: Secondary | ICD-10-CM | POA: Diagnosis present

## 2024-04-30 DIAGNOSIS — Z794 Long term (current) use of insulin: Secondary | ICD-10-CM | POA: Diagnosis not present

## 2024-04-30 DIAGNOSIS — Z803 Family history of malignant neoplasm of breast: Secondary | ICD-10-CM

## 2024-04-30 DIAGNOSIS — F32A Depression, unspecified: Secondary | ICD-10-CM | POA: Diagnosis present

## 2024-04-30 DIAGNOSIS — E861 Hypovolemia: Secondary | ICD-10-CM | POA: Diagnosis present

## 2024-04-30 DIAGNOSIS — R569 Unspecified convulsions: Secondary | ICD-10-CM | POA: Diagnosis not present

## 2024-04-30 DIAGNOSIS — C7931 Secondary malignant neoplasm of brain: Secondary | ICD-10-CM | POA: Diagnosis present

## 2024-04-30 DIAGNOSIS — R8281 Pyuria: Secondary | ICD-10-CM | POA: Insufficient documentation

## 2024-04-30 DIAGNOSIS — E042 Nontoxic multinodular goiter: Secondary | ICD-10-CM | POA: Diagnosis not present

## 2024-04-30 DIAGNOSIS — Z87891 Personal history of nicotine dependence: Secondary | ICD-10-CM

## 2024-04-30 DIAGNOSIS — I10 Essential (primary) hypertension: Secondary | ICD-10-CM | POA: Insufficient documentation

## 2024-04-30 DIAGNOSIS — I472 Ventricular tachycardia, unspecified: Secondary | ICD-10-CM | POA: Diagnosis not present

## 2024-04-30 DIAGNOSIS — D630 Anemia in neoplastic disease: Secondary | ICD-10-CM | POA: Diagnosis present

## 2024-04-30 DIAGNOSIS — Z515 Encounter for palliative care: Secondary | ICD-10-CM | POA: Diagnosis not present

## 2024-04-30 DIAGNOSIS — G936 Cerebral edema: Secondary | ICD-10-CM | POA: Diagnosis present

## 2024-04-30 DIAGNOSIS — Y92009 Unspecified place in unspecified non-institutional (private) residence as the place of occurrence of the external cause: Secondary | ICD-10-CM | POA: Diagnosis not present

## 2024-04-30 DIAGNOSIS — I129 Hypertensive chronic kidney disease with stage 1 through stage 4 chronic kidney disease, or unspecified chronic kidney disease: Secondary | ICD-10-CM | POA: Diagnosis present

## 2024-04-30 DIAGNOSIS — F419 Anxiety disorder, unspecified: Secondary | ICD-10-CM | POA: Diagnosis present

## 2024-04-30 DIAGNOSIS — C3490 Malignant neoplasm of unspecified part of unspecified bronchus or lung: Secondary | ICD-10-CM | POA: Diagnosis not present

## 2024-04-30 DIAGNOSIS — K5900 Constipation, unspecified: Secondary | ICD-10-CM | POA: Diagnosis present

## 2024-04-30 DIAGNOSIS — I2510 Atherosclerotic heart disease of native coronary artery without angina pectoris: Secondary | ICD-10-CM | POA: Diagnosis present

## 2024-04-30 DIAGNOSIS — R911 Solitary pulmonary nodule: Secondary | ICD-10-CM | POA: Diagnosis present

## 2024-04-30 DIAGNOSIS — N179 Acute kidney failure, unspecified: Secondary | ICD-10-CM | POA: Diagnosis present

## 2024-04-30 DIAGNOSIS — Z6833 Body mass index (BMI) 33.0-33.9, adult: Secondary | ICD-10-CM | POA: Diagnosis not present

## 2024-04-30 DIAGNOSIS — E669 Obesity, unspecified: Secondary | ICD-10-CM | POA: Diagnosis present

## 2024-04-30 DIAGNOSIS — Z79899 Other long term (current) drug therapy: Secondary | ICD-10-CM

## 2024-04-30 DIAGNOSIS — N390 Urinary tract infection, site not specified: Secondary | ICD-10-CM | POA: Diagnosis present

## 2024-04-30 DIAGNOSIS — T380X5A Adverse effect of glucocorticoids and synthetic analogues, initial encounter: Secondary | ICD-10-CM | POA: Diagnosis present

## 2024-04-30 DIAGNOSIS — E1165 Type 2 diabetes mellitus with hyperglycemia: Secondary | ICD-10-CM | POA: Diagnosis present

## 2024-04-30 DIAGNOSIS — R54 Age-related physical debility: Secondary | ICD-10-CM | POA: Diagnosis present

## 2024-04-30 DIAGNOSIS — G4700 Insomnia, unspecified: Secondary | ICD-10-CM | POA: Diagnosis present

## 2024-04-30 DIAGNOSIS — E872 Acidosis, unspecified: Secondary | ICD-10-CM | POA: Diagnosis present

## 2024-04-30 DIAGNOSIS — Z9049 Acquired absence of other specified parts of digestive tract: Secondary | ICD-10-CM

## 2024-04-30 DIAGNOSIS — I471 Supraventricular tachycardia, unspecified: Secondary | ICD-10-CM | POA: Diagnosis not present

## 2024-04-30 DIAGNOSIS — R638 Other symptoms and signs concerning food and fluid intake: Secondary | ICD-10-CM | POA: Diagnosis not present

## 2024-04-30 DIAGNOSIS — I700 Atherosclerosis of aorta: Secondary | ICD-10-CM | POA: Diagnosis present

## 2024-04-30 DIAGNOSIS — I517 Cardiomegaly: Secondary | ICD-10-CM | POA: Diagnosis not present

## 2024-04-30 DIAGNOSIS — K529 Noninfective gastroenteritis and colitis, unspecified: Secondary | ICD-10-CM | POA: Diagnosis present

## 2024-04-30 DIAGNOSIS — E278 Other specified disorders of adrenal gland: Secondary | ICD-10-CM | POA: Diagnosis present

## 2024-04-30 DIAGNOSIS — R918 Other nonspecific abnormal finding of lung field: Secondary | ICD-10-CM | POA: Diagnosis not present

## 2024-04-30 DIAGNOSIS — Z7984 Long term (current) use of oral hypoglycemic drugs: Secondary | ICD-10-CM

## 2024-04-30 DIAGNOSIS — C342 Malignant neoplasm of middle lobe, bronchus or lung: Principal | ICD-10-CM | POA: Diagnosis present

## 2024-04-30 DIAGNOSIS — Z91040 Latex allergy status: Secondary | ICD-10-CM

## 2024-04-30 DIAGNOSIS — I959 Hypotension, unspecified: Secondary | ICD-10-CM | POA: Diagnosis present

## 2024-04-30 DIAGNOSIS — H532 Diplopia: Secondary | ICD-10-CM | POA: Diagnosis not present

## 2024-04-30 DIAGNOSIS — R7989 Other specified abnormal findings of blood chemistry: Secondary | ICD-10-CM | POA: Diagnosis not present

## 2024-04-30 HISTORY — DX: Essential (primary) hypertension: I10

## 2024-04-30 LAB — COMPREHENSIVE METABOLIC PANEL WITH GFR
ALT: 18 U/L (ref 0–44)
AST: 35 U/L (ref 15–41)
Albumin: 4 g/dL (ref 3.5–5.0)
Alkaline Phosphatase: 38 U/L (ref 38–126)
Anion gap: 16 — ABNORMAL HIGH (ref 5–15)
BUN: 79 mg/dL — ABNORMAL HIGH (ref 8–23)
CO2: 18 mmol/L — ABNORMAL LOW (ref 22–32)
Calcium: 12.5 mg/dL — ABNORMAL HIGH (ref 8.9–10.3)
Chloride: 96 mmol/L — ABNORMAL LOW (ref 98–111)
Creatinine, Ser: 5.46 mg/dL — ABNORMAL HIGH (ref 0.44–1.00)
GFR, Estimated: 8 mL/min — ABNORMAL LOW
Glucose, Bld: 134 mg/dL — ABNORMAL HIGH (ref 70–99)
Potassium: 6.1 mmol/L — ABNORMAL HIGH (ref 3.5–5.1)
Sodium: 130 mmol/L — ABNORMAL LOW (ref 135–145)
Total Bilirubin: 0.4 mg/dL (ref 0.0–1.2)
Total Protein: 6.3 g/dL — ABNORMAL LOW (ref 6.5–8.1)

## 2024-04-30 LAB — URINALYSIS, W/ REFLEX TO CULTURE (INFECTION SUSPECTED)
Bilirubin Urine: NEGATIVE
Glucose, UA: NEGATIVE mg/dL
Ketones, ur: 5 mg/dL — AB
Nitrite: NEGATIVE
Protein, ur: >=300 mg/dL — AB
Specific Gravity, Urine: 1.017 (ref 1.005–1.030)
WBC, UA: >50 WBC/hpf (ref 0–5)
pH: 5 (ref 5.0–8.0)

## 2024-04-30 LAB — CBC WITH DIFFERENTIAL/PLATELET
Abs Immature Granulocytes: 0.07 K/uL (ref 0.00–0.07)
Basophils Absolute: 0 K/uL (ref 0.0–0.1)
Basophils Relative: 0 %
Eosinophils Absolute: 0 K/uL (ref 0.0–0.5)
Eosinophils Relative: 0 %
HCT: 35.9 % — ABNORMAL LOW (ref 36.0–46.0)
Hemoglobin: 12 g/dL (ref 12.0–15.0)
Immature Granulocytes: 1 %
Lymphocytes Relative: 10 %
Lymphs Abs: 1.4 K/uL (ref 0.7–4.0)
MCH: 30.2 pg (ref 26.0–34.0)
MCHC: 33.4 g/dL (ref 30.0–36.0)
MCV: 90.4 fL (ref 80.0–100.0)
Monocytes Absolute: 1 K/uL (ref 0.1–1.0)
Monocytes Relative: 7 %
Neutro Abs: 11.9 K/uL — ABNORMAL HIGH (ref 1.7–7.7)
Neutrophils Relative %: 82 %
Platelets: 258 K/uL (ref 150–400)
RBC: 3.97 MIL/uL (ref 3.87–5.11)
RDW: 13.8 % (ref 11.5–15.5)
WBC: 14.4 K/uL — ABNORMAL HIGH (ref 4.0–10.5)
nRBC: 0 % (ref 0.0–0.2)

## 2024-04-30 LAB — C DIFFICILE QUICK SCREEN W PCR REFLEX
C Diff antigen: NEGATIVE
C Diff interpretation: NOT DETECTED
C Diff toxin: NEGATIVE

## 2024-04-30 LAB — TROPONIN T, HIGH SENSITIVITY: Troponin T High Sensitivity: 126 ng/L (ref 0–19)

## 2024-04-30 LAB — CBG MONITORING, ED: Glucose-Capillary: 115 mg/dL — ABNORMAL HIGH (ref 70–99)

## 2024-04-30 LAB — MAGNESIUM: Magnesium: 1.9 mg/dL (ref 1.7–2.4)

## 2024-04-30 LAB — CK: Total CK: 201 U/L (ref 38–234)

## 2024-04-30 MED ORDER — SODIUM CHLORIDE 0.9 % IV BOLUS
1000.0000 mL | Freq: Once | INTRAVENOUS | Status: AC
  Administered 2024-04-30: 1000 mL via INTRAVENOUS

## 2024-04-30 MED ORDER — SODIUM CHLORIDE 0.9 % IV SOLN: INTRAVENOUS | Status: DC

## 2024-04-30 MED ORDER — SIMVASTATIN 20 MG PO TABS
40.0000 mg | ORAL_TABLET | Freq: Every day | ORAL | Status: DC
  Administered 2024-05-01 – 2024-05-26 (×27): 40 mg via ORAL
  Filled 2024-04-30 (×8): qty 2
  Filled 2024-04-30 (×2): qty 4
  Filled 2024-04-30 (×9): qty 2

## 2024-04-30 MED ORDER — LEVETIRACETAM 500 MG PO TABS
750.0000 mg | ORAL_TABLET | Freq: Two times a day (BID) | ORAL | Status: DC
  Administered 2024-05-01 (×3): 750 mg via ORAL
  Filled 2024-04-30: qty 1

## 2024-04-30 MED ORDER — ACETAMINOPHEN 500 MG PO TABS
500.0000 mg | ORAL_TABLET | Freq: Four times a day (QID) | ORAL | Status: DC | PRN
  Administered 2024-05-01: 500 mg via ORAL

## 2024-04-30 MED ORDER — PROCHLORPERAZINE EDISYLATE 10 MG/2ML IJ SOLN: 5.0000 mg | Freq: Four times a day (QID) | INTRAMUSCULAR | Status: DC | PRN

## 2024-04-30 MED ORDER — INSULIN ASPART 100 UNIT/ML IJ SOLN
0.0000 [IU] | Freq: Every day | INTRAMUSCULAR | Status: DC
  Administered 2024-05-02: 2 [IU] via SUBCUTANEOUS
  Administered 2024-05-03: 3 [IU] via SUBCUTANEOUS
  Administered 2024-05-04 – 2024-05-07 (×3): 2 [IU] via SUBCUTANEOUS
  Administered 2024-05-08: 3 [IU] via SUBCUTANEOUS
  Administered 2024-05-09 – 2024-05-16 (×3): 2 [IU] via SUBCUTANEOUS
  Administered 2024-05-17: 3 [IU] via SUBCUTANEOUS
  Administered 2024-05-25: 2 [IU] via SUBCUTANEOUS
  Filled 2024-04-30: qty 3
  Filled 2024-04-30 (×2): qty 2
  Filled 2024-04-30: qty 1
  Filled 2024-04-30: qty 3
  Filled 2024-04-30 (×2): qty 2

## 2024-04-30 MED ORDER — MELATONIN 5 MG PO TABS
5.0000 mg | ORAL_TABLET | Freq: Every evening | ORAL | Status: DC | PRN
  Administered 2024-05-02 – 2024-05-25 (×7): 5 mg via ORAL
  Filled 2024-04-30 (×5): qty 1

## 2024-04-30 MED ORDER — INSULIN ASPART 100 UNIT/ML IJ SOLN
0.0000 [IU] | Freq: Three times a day (TID) | INTRAMUSCULAR | Status: DC
  Administered 2024-05-02: 1 [IU] via SUBCUTANEOUS
  Administered 2024-05-02: 2 [IU] via SUBCUTANEOUS
  Administered 2024-05-03 (×3): 1 [IU] via SUBCUTANEOUS
  Administered 2024-05-04: 2 [IU] via SUBCUTANEOUS
  Administered 2024-05-04: 3 [IU] via SUBCUTANEOUS
  Administered 2024-05-04: 1 [IU] via SUBCUTANEOUS
  Administered 2024-05-05: 2 [IU] via SUBCUTANEOUS
  Administered 2024-05-05: 1 [IU] via SUBCUTANEOUS
  Administered 2024-05-05: 3 [IU] via SUBCUTANEOUS
  Administered 2024-05-06: 2 [IU] via SUBCUTANEOUS
  Administered 2024-05-06: 1 [IU] via SUBCUTANEOUS
  Administered 2024-05-06 – 2024-05-07 (×2): 2 [IU] via SUBCUTANEOUS
  Administered 2024-05-07: 1 [IU] via SUBCUTANEOUS
  Administered 2024-05-07 – 2024-05-08 (×3): 2 [IU] via SUBCUTANEOUS
  Administered 2024-05-08: 1 [IU] via SUBCUTANEOUS
  Administered 2024-05-09 – 2024-05-10 (×2): 2 [IU] via SUBCUTANEOUS
  Administered 2024-05-10: 3 [IU] via SUBCUTANEOUS
  Administered 2024-05-10 – 2024-05-12 (×5): 2 [IU] via SUBCUTANEOUS
  Administered 2024-05-12 – 2024-05-15 (×8): 1 [IU] via SUBCUTANEOUS
  Administered 2024-05-15: 2 [IU] via SUBCUTANEOUS
  Administered 2024-05-16: 1 [IU] via SUBCUTANEOUS
  Administered 2024-05-17: 2 [IU] via SUBCUTANEOUS
  Administered 2024-05-17: 1 [IU] via SUBCUTANEOUS
  Administered 2024-05-17 – 2024-05-18 (×2): 2 [IU] via SUBCUTANEOUS
  Administered 2024-05-18 (×2): 1 [IU] via SUBCUTANEOUS
  Administered 2024-05-19: 2 [IU] via SUBCUTANEOUS
  Administered 2024-05-19: 1 [IU] via SUBCUTANEOUS
  Administered 2024-05-20 – 2024-05-21 (×4): 2 [IU] via SUBCUTANEOUS
  Administered 2024-05-22: 1 [IU] via SUBCUTANEOUS
  Administered 2024-05-23: 2 [IU] via SUBCUTANEOUS
  Administered 2024-05-23: 1 [IU] via SUBCUTANEOUS
  Administered 2024-05-23: 2 [IU] via SUBCUTANEOUS
  Administered 2024-05-24 (×2): 1 [IU] via SUBCUTANEOUS
  Administered 2024-05-24: 4 [IU] via SUBCUTANEOUS
  Administered 2024-05-25: 2 [IU] via SUBCUTANEOUS
  Administered 2024-05-25: 1 [IU] via SUBCUTANEOUS
  Administered 2024-05-25: 3 [IU] via SUBCUTANEOUS
  Administered 2024-05-26: 4 [IU] via SUBCUTANEOUS
  Administered 2024-05-26: 3 [IU] via SUBCUTANEOUS
  Administered 2024-05-26: 1 [IU] via SUBCUTANEOUS
  Filled 2024-04-30: qty 1
  Filled 2024-04-30: qty 4
  Filled 2024-04-30 (×4): qty 2
  Filled 2024-04-30: qty 1
  Filled 2024-04-30: qty 2
  Filled 2024-04-30 (×2): qty 1
  Filled 2024-04-30: qty 4
  Filled 2024-04-30: qty 3
  Filled 2024-04-30: qty 1
  Filled 2024-04-30 (×3): qty 2
  Filled 2024-04-30: qty 3
  Filled 2024-04-30: qty 2
  Filled 2024-04-30: qty 1
  Filled 2024-04-30: qty 2
  Filled 2024-04-30 (×2): qty 1
  Filled 2024-04-30: qty 2
  Filled 2024-04-30: qty 1
  Filled 2024-04-30 (×2): qty 2
  Filled 2024-04-30: qty 1
  Filled 2024-04-30 (×3): qty 2
  Filled 2024-04-30: qty 4
  Filled 2024-04-30 (×3): qty 1
  Filled 2024-04-30: qty 2
  Filled 2024-04-30 (×2): qty 1
  Filled 2024-04-30: qty 4
  Filled 2024-04-30: qty 2

## 2024-04-30 MED ORDER — HEPARIN SODIUM (PORCINE) 5000 UNIT/ML IJ SOLN
5000.0000 [IU] | Freq: Three times a day (TID) | INTRAMUSCULAR | Status: DC
  Administered 2024-05-01 – 2024-05-08 (×22): 5000 [IU] via SUBCUTANEOUS
  Filled 2024-04-30 (×2): qty 1

## 2024-04-30 MED ORDER — PANTOPRAZOLE SODIUM 40 MG PO TBEC
40.0000 mg | DELAYED_RELEASE_TABLET | Freq: Every day | ORAL | Status: DC
  Administered 2024-05-01 – 2024-05-26 (×26): 40 mg via ORAL
  Filled 2024-04-30 (×19): qty 1

## 2024-04-30 MED ORDER — SODIUM CHLORIDE 0.9 % IV SOLN
1.0000 g | Freq: Once | INTRAVENOUS | Status: AC
  Administered 2024-04-30: 1 g via INTRAVENOUS
  Filled 2024-04-30: qty 10

## 2024-04-30 MED ORDER — POLYETHYLENE GLYCOL 3350 17 G PO PACK: 17.0000 g | PACK | Freq: Every day | ORAL | Status: DC | PRN

## 2024-04-30 NOTE — ED Provider Notes (Signed)
 " [place] EMERGENCY DEPARTMENT AT [facility] Provider Note   CSN: [id number] Arrival date & time: 04/30/24  1957     History  Chief Complaint  Patient presents with   Dizziness   Fall    Sonya Solomon is a 78 y.o. female with PMH as listed below who presents with  BIBA from home, pt fell reports she felt dizzy and then fell.  Patient also intermittently states that she cannot exactly recall the circumstances of the fall.  Patient states that her husband came into the room and tried to get her up for approximately 4 hours but they were not successful and eventually called EMS.. EMS reports pt was hypotensive upon arrival 70/40. NO LOC, pt denies any pain, N&V.  She denies hitting her head or any pain or injuries as a result of the fall.  She does have a history of multiple sclerosis but is not been on treatment recently.  Has had right leg shaking that she is having worked up.  Does take Keppra .  She also endorses recent onset of diarrhea, cannot remember the exact timeframe, but states that she has not been eating and drinking very well because lately it goes right through her.  She cannot get to the bathroom very fast because of her mobility issues and so has not wanted to eat and then subsequently have diarrhea.  Denies any bright red blood per rectum or melena.  1250 cc of LR given by EMS no epinephrie.      EMS vital  70/40 BP   118/56 after LR 22 R T- 100.0 CBG 209     Past Medical History:  Diagnosis Date   Diabetes mellitus without complication (HCC)    MS (multiple sclerosis)        Home Medications Prior to Admission medications  Medication Sig Start Date End Date Taking? Authorizing Provider  LORazepam (ATIVAN) 1 MG tablet Take 1 mg by mouth every 3 (three) days. 02/04/24  Yes [provider]  desmopressin  (DDAVP ) 0.1 MG tablet Take 1 tablet (0.1 mg total) by mouth at bedtime. 06/01/23   Sater, Charlie LABOR, MD  doxepin  (SINEQUAN ) 10 MG  capsule Take 1 capsule (10 mg total) by mouth at bedtime. 12/09/23   Sater, Charlie LABOR, MD  HYDROcodone -acetaminophen  (NORCO) 7.5-325 MG tablet Take 0.5-1 tablets by mouth 2 (two) times daily as needed. 04/14/24   Sater, Charlie LABOR, MD  levETIRAcetam  (KEPPRA ) 750 MG tablet Take 1 tablet (750 mg total) by mouth 2 (two) times daily. 03/02/24   Sater, Charlie LABOR, MD  lisinopril  (ZESTRIL ) 10 MG tablet TAKE 1 TABLET(10 MG) BY MOUTH DAILY 07/13/22   Duanne Butler DASEN, MD  loperamide  (IMODIUM ) 2 MG capsule Take 1 capsule (2 mg total) by mouth as needed for diarrhea or loose stools. 05/10/20   Bari Theodoro FALCON, MD  metFORMIN  (GLUCOPHAGE ) 1000 MG tablet TAKE 1 TABLET(1000 MG) BY MOUTH TWICE DAILY WITH A MEAL 04/29/21   Chandra Harlene LABOR, NP  omeprazole (PRILOSEC) 20 MG capsule Take 20 mg by mouth 2 (two) times daily. 07/07/21   [provider]  pioglitazone  (ACTOS ) 45 MG tablet Take 1 tablet (45 mg total) by mouth daily. 01/27/21   Chandra Harlene LABOR, NP  Semaglutide , 1 MG/DOSE, (OZEMPIC , 1 MG/DOSE,) 2 MG/1.5ML SOPN Inject 1 mg into the skin once a week. 08/05/20   Chandra Harlene LABOR, NP  simvastatin  (ZOCOR ) 40 MG tablet TAKE 1 TABLET(40 MG) BY MOUTH DAILY 08/11/21   Pickard,  Butler DASEN, MD  tiZANidine  (ZANAFLEX ) 2 MG tablet Take 1 tablet (2 mg total) by mouth every 8 (eight) hours as needed for muscle spasms. 12/30/23   Sater, Charlie LABOR, MD      Allergies    Latex    Review of Systems   Review of Systems A 10 point review of systems was performed and is negative unless otherwise reported in HPI.  Physical Exam Updated Vital Signs BP 116/78 (BP Location: Right Arm) Comment: Simultaneous filing. User may not have seen previous data. Comment (BP Location): Simultaneous filing. User may not have seen previous data.  Pulse 76 Comment: Simultaneous filing. User may not have seen previous data.  Temp (!) 97.5 F (36.4 C) (Oral)   Resp 20 Comment: Simultaneous filing. User may not have seen previous data.   Ht 5' 4.5 (1.638 m)   Wt 89.6 kg   SpO2 97% Comment: Simultaneous filing. User may not have seen previous data.  BMI 33.38 kg/m  Physical Exam General: Elderly female, lying in bed.  HEENT: NCAT, EOMI, PERRLA, Sclera anicteric, MMM, trachea midline.  No injuries to the face, lips, tongue, or teeth. Cardiology: RRR, no murmurs/rubs/gallops. BL DP pulses equal bilaterally.  No chest wall tenderness palpation or deformities. Resp: Normal respiratory rate and effort. CTAB, no wheezes, rhonchi, crackles.  Abd: Soft, non-tender, non-distended. No rebound tenderness or guarding.  GU: Deferred. MSK: 1+ peripheral edema noted equally bilaterally.  No signs of trauma.  Skin: warm, dry.  Back: No CVA tenderness.  No CT or L-spine tenderness palpation deformities or step-offs. Neuro: A&Ox4, CNs II-XII grossly intact. MAEs. Sensation grossly intact.  Psych: Normal mood and affect.   ED Results / Procedures / Treatments   Labs (all labs ordered are listed, but only abnormal results are displayed) Labs Reviewed  COMPREHENSIVE METABOLIC PANEL WITH GFR - Abnormal; Notable for the following components:      Result Value   Sodium 130 (*)    Potassium 6.1 (*)    Chloride 96 (*)    CO2 18 (*)    Glucose, Bld 134 (*)    BUN 79 (*)    Creatinine, Ser 5.46 (*)    Calcium 12.5 (*)    Total Protein 6.3 (*)    GFR, Estimated 8 (*)    Anion gap 16 (*)    All other components within normal limits  CBC WITH DIFFERENTIAL/PLATELET - Abnormal; Notable for the following components:   WBC 14.4 (*)    HCT 35.9 (*)    Neutro Abs 11.9 (*)    All other components within normal limits  URINALYSIS, W/ REFLEX TO CULTURE (INFECTION SUSPECTED) - Abnormal; Notable for the following components:   APPearance CLOUDY (*)    Hgb urine dipstick SMALL (*)    Ketones, ur 5 (*)    Protein, ur >=300 (*)    Leukocytes,Ua LARGE (*)    Bacteria, UA MANY (*)    All other components within normal limits  TROPONIN T, HIGH  SENSITIVITY - Abnormal; Notable for the following components:   Troponin T High Sensitivity 126 (*)    All other components within normal limits  C DIFFICILE QUICK SCREEN W PCR REFLEX    URINE CULTURE  CULTURE, BLOOD (ROUTINE X 2)  CULTURE, BLOOD (ROUTINE X 2)  MAGNESIUM  CK    EKG EKG Interpretation Date/Time:  Sunday April 30 2024 20:12:55 EST Ventricular Rate:  80 PR Interval:  161 QRS Duration:  144 QT Interval:  395  QTC Calculation: 395 R Axis:   54  Text Interpretation: Ectopic atrial rhythm Supraventricular bigeminy Nonspecific intraventricular conduction delay Similar to prior EKG Confirmed by Franklyn Gills [phone]) on 04/30/2024 8:18:00 PM  Radiology CT ABDOMEN PELVIS WO CONTRAST Result Date: 04/30/2024 EXAM: CT ABDOMEN AND PELVIS WITHOUT CONTRAST 04/30/2024 09:16:11 PM TECHNIQUE: CT of the abdomen and pelvis was performed without the administration of intravenous contrast. Multiplanar reformatted images are provided for review. Automated exposure control, iterative reconstruction, and/or weight-based adjustment of the mA/kV was utilized to reduce the radiation dose to as low as reasonably achievable. COMPARISON: None available. CLINICAL HISTORY: Bowel obstruction suspected; Sepsis. 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 LIVER: The liver is unremarkable. GALLBLADDER AND BILE DUCTS: Gallbladder is not visualized. No biliary ductal dilatation. SPLEEN: No acute abnormality. PANCREAS: No acute abnormality. ADRENAL GLANDS: There is an intramural left adrenal nodule measuring 1.9 x 1.4 cm. KIDNEYS, URETERS AND BLADDER: No stones in the kidneys or ureters. No hydronephrosis. No perinephric or periureteral stranding. Urinary bladder is unremarkable. GI AND BOWEL: Stomach demonstrates no acute abnormality. Sigmoid colon anastomosis is present. There is sigmoid and descending colon diverticulosis. There is circumferential wall thickening of the  entire colon more significant proximally. There is mild inflammatory stranding surrounding the ascending colon. The appendix was not definitively visualized. There is no bowel obstruction. PERITONEUM AND RETROPERITONEUM: No ascites. No free air. VASCULATURE: Aorta is normal in caliber. There are atherosclerotic calcifications of the aorta and iliac arteries. LYMPH NODES: No lymphadenopathy. REPRODUCTIVE ORGANS: Uterus is surgically absent. BONES AND SOFT TISSUES: No acute osseous abnormality. No focal soft tissue abnormality. IMPRESSION: 1. Pancolitis, greatest proximally. 2. Sigmoid and descending colon diverticulosis without diverticulitis. 3. Indeterminate 1.9 cm left adrenal nodule; recommend 1-year follow-up adrenal washout CT. 4. Partially imaged intraatrial lipoma. Electronically signed by: Greig Pique MD 04/30/2024 10:06 PM EST RP Workstation: [id number]   DG Chest Port 1 View Result Date: 04/30/2024 EXAM: 1 VIEW(S) XRAY OF THE CHEST 04/30/2024 08:28:32 PM COMPARISON: None available. CLINICAL HISTORY: weakness, fall FINDINGS: LUNGS AND PLEURA: No focal pulmonary opacity. No pleural effusion. No pneumothorax. HEART AND MEDIASTINUM: Aortic arch calcifications. BONES AND SOFT TISSUES: Multilevel thoracic osteophytosis. No acute osseous abnormality. IMPRESSION: 1. No acute cardiopulmonary abnormality. Electronically signed by: Morgane Naveau MD 04/30/2024 09:12 PM EST RP Workstation: [id number]    Procedures .Critical Care  Performed by: Franklyn Gills SAILOR, MD Authorized by: Franklyn Gills SAILOR, MD   Critical care provider statement:    Critical care time (minutes):  40   Critical care was necessary to treat or prevent imminent or life-threatening deterioration of the following conditions:  Dehydration, metabolic crisis and shock   Critical care was time spent personally by me on the following activities:  Development of treatment plan with patient or surrogate, discussions with consultants, evaluation  of patient's response to treatment, examination of patient, ordering and review of laboratory studies, ordering and review of radiographic studies, ordering and performing treatments and interventions, pulse oximetry, re-evaluation of patient's condition, review of old charts and obtaining history from patient or surrogate   Care discussed with: admitting provider       Medications Ordered in ED Medications  cefTRIAXone  (ROCEPHIN ) 1 g in sodium chloride  0.9 % 100 mL IVPB (has no administration in time range)  sodium chloride  0.9 % bolus 1,000 mL (1,000 mLs Intravenous New Bag/Given 04/30/24 2205)    ED Course/ Medical Decision Making/ A&P  Medical Decision Making Amount and/or Complexity of Data Reviewed Labs: ordered. Decision-making details documented in ED Course. Radiology: ordered. Decision-making details documented in ED Course.  Risk Decision regarding hospitalization.    This patient presents to the ED for concern of fall, dizzy, diarrhea, poor p.o. intake, hypotension, this involves an extensive number of treatment options, and is a complaint that carries with it a high risk of complications and morbidity.  I considered the following differential and admission for this acute, potentially life threatening condition.   MDM:    Patient with no head injury and no reports of injury from the fall.  She endorses that she was dizzy and was found to be hypotensive on arrival.  Reports poor p.o. intake and diarrhea.  Concern for hypovolemic shock versus septic shock.  No chest pain or shortness of breath reported to indicate cardiac or obstructive shock.  She did improve with fluids from EMS. Her labs are remarkable for acute renal failure, hyperkalemia to 6.1 though some hemolysis noted.  BUN 79.  This is likely prerenal in etiology given her diarrhea and decreased p.o. intake.  Her CT ab and pelvis does not demonstrate any obstructive pathology.  It does  demonstrate pancolitis.  C. difficile studies ordered.  Also consider viral etiology.  Her UA is positive for UTI as well and will give IV ceftriaxone .  Culture ordered.  She is not febrile, tachycardic, or tachypneic.  Her she has no rhabdomyolysis on labs.  Troponin is elevated as well, cardiology recommends trending the value.  Nephrology recommends fluids and reconsult in the morning if creatinine not coming down appropriately.  Do not believe she requires acute dialysis.  Will admit to medicine  Clinical Course as of 04/30/24 2259  Sun Apr 30, 2024  2116 WBC(!): 14.4 +leukocytosis w/ left shift [HN]  2149 CK Total: 201 No rhabdo [HN]  2152 Creatinine(!): 5.46 +ARF. Giving another L of IVF [HN]  2153 Potassium(!): 6.1 EKG didn't demonstrate any peaked T waves.  There is some hemolysis as well, likely not quite as high as 6.1. [HN]  2154 BUN(!): 79 [HN]  2154 Troponin T High Sensitivity(!!): 126 Elevated troponin as well [HN]  2157 Consulted to nephrology as well [HN]  2201 D/w Dr. Winfred with cardiology who recommends trending troponin and treatment of renal. Can stay at [facility]. [HN]  2204 Dr. Marlee recommends imaging, fluids, holding lisinopril , rule out obstruction, the recheck in the AM. Recommends re-consulting in AM if not improved.  [HN]  2214 CT ABDOMEN PELVIS WO CONTRAST 1. Pancolitis, greatest proximally. 2. Sigmoid and descending colon diverticulosis without diverticulitis. 3. Indeterminate 1.9 cm left adrenal nodule; recommend 1-year follow-up adrenal washout CT. 4. Partially imaged intraatrial lipoma.   [HN]  2255 Urinalysis, w/ Reflex to Culture (Infection Suspected) -Urine, Clean Catch(!) +UTI as well. Will give IV ceftriaxone . Ordered urine culture as well.  Doubt pyelonephritis as she had no CVA tenderness palpation on exam.  Reported no flank pain, no ureterolithiasis noted on CT or hydronephrosis.  No prior culture data in chart. [HN]    Clinical Course  User Index [HN] Franklyn Sid SAILOR, MD    Labs: I Ordered, and personally interpreted labs.  The pertinent results include: Those listed above  Imaging Studies ordered: I ordered imaging studies including chest x-ray I independently visualized and interpreted imaging. I agree with the radiologist interpretation  Additional history obtained from chart review, EMS.   Cardiac Monitoring: The patient was maintained on a cardiac monitor.  I personally viewed and interpreted the cardiac monitored which showed an underlying rhythm of: Normal sinus rhythm  Reevaluation: After the interventions noted above, I reevaluated the patient and found that they have :improved  Social Determinants of Health:  lives with husband, walks with a walker at baseline  Disposition: Admit to hospitalist with cardiology following  Co morbidities that complicate the patient evaluation  Past Medical History:  Diagnosis Date   Diabetes mellitus without complication (HCC)    MS (multiple sclerosis)      Medicines Meds ordered this encounter  Medications   sodium chloride  0.9 % bolus 1,000 mL   cefTRIAXone  (ROCEPHIN ) 1 g in sodium chloride  0.9 % 100 mL IVPB    Antibiotic Indication::   UTI    I have reviewed the patients home medicines and have made adjustments as needed  Problem List / ED Course: Problem List Items Addressed This Visit   None Visit Diagnoses       Acute renal failure, unspecified acute renal failure type    -  Primary     Fall in home, initial encounter         Hyperkalemia         Diarrhea, unspecified type         Decreased oral intake         Pancolitis         Urinary tract infection without hematuria, site unspecified       Relevant Medications   cefTRIAXone  (ROCEPHIN ) 1 g in sodium chloride  0.9 % 100 mL IVPB (Start on 04/30/2024 11:00 PM)                   This note was created using dictation software, which may contain spelling or grammatical errors.     Franklyn Sid SAILOR, MD 04/30/24 2259  "

## 2024-04-30 NOTE — ED Triage Notes (Signed)
 Pt BIBA from home, pt fell reports she felt dizzy & fell. EMS reports pt was hypotensive upon arrival 70/40, reports being on the floor for about 4 hours until husband found her. NO LOC, pt denies any pain, N&V. No lacerations noted.  Pt also denies hitting her head.  Hx MS 1250 of LR given by EMS no epinephrie.    EMS vital  70/40 BP   118/56 after LR 22 R T- 100.0 CBG 209   1250 of LR

## 2024-04-30 NOTE — H&P (Signed)
 " History and Physical  KIMBERELY MCCANNON FMW:[id number] DOB: 09/29/1945 DOA: 04/30/2024  Referring physician: Dr. Franklyn, EDP  PCP: Leonel Cole, MD  Outpatient Specialists: Neurology. Patient coming from: Home.  Chief Complaint: Generalized weakness, dizziness and fall.   HPI: Sonya Solomon is a 78 y.o. female with medical history significant for MS, type 2 diabetes, hyperlipidemia, hypertension, obesity, history of gait disturbance, vertigo, urinary urgency, spasticity, insomnia, who presents to the ER due to generalized weakness, dizziness, and a fall.  Endorses being on the floor for about 4 hours until her husband could get help.  Endorses recent onset of diarrhea.  She has not been eating or drinking very well because it goes right through her, she cannot get to the bathroom quickly enough because of her mobility issues.  She has not wanted to eat and then subsequently have diarrhea.  Denies any blood in her stool or dark stools.  EMS was activated.  Upon EMS arrival, the patient was hypotensive with SBP in 70s and DBP 40.  Received 1250 cc of IV LR en route via EMS.  In the ER, received 1 L IV fluid bolus NS.  Lab studies notable for elevated creatinine 5.4 with GFR of 89, above baseline creatinine fluids, potassium 6.1, serum bicarb 18 with anion gap of 16.  Troponin 126.  UA positive for pyuria.  EDP discussed the case with nephrology and cardiology.  Per nephrology continue IV fluid and will officially consult in the morning if no improvement.  Per cardiology trend troponin.  Admitted by [facility], hospitalist service.  ED Course: Temperature 97.6.  BP 89/42, pulse 66, respiratory rate 18, O2 saturation 96% on room air.  Received Rocephin  for UTI and IV fluid for AKI.  Review of Systems: Review of systems as noted in the HPI. All other systems reviewed and are negative.   Past Medical History:  Diagnosis Date   Diabetes mellitus without complication (HCC)    MS (multiple sclerosis)     Past Surgical History:  Procedure Laterality Date   ABDOMINAL HYSTERECTOMY     APPENDECTOMY     BOWEL RESECTION  1996   BREAST REDUCTION SURGERY  07/10/1986   CATARACT EXTRACTION, BILATERAL     12/21/19, 09/19.21   CHOLECYSTECTOMY  05/2004   DE QUERVAIN'S RELEASE  05/18/2014   RADIAL STYLOIDECTOMY WRIST Left 05/10/2012   REDUCTION MAMMAPLASTY     TRIGGER FINGER RELEASE Left 01/09/2011   UMBILICAL HERNIA REPAIR  08/10/2006    Social History:  reports that she has never smoked. She has never used smokeless tobacco. She reports that she does not drink alcohol and does not use drugs.   Allergies[1]  Family History  Problem Relation Age of Onset   Breast cancer Mother    Heart attack Father       Prior to Admission medications  Medication Sig Start Date End Date Taking? Authorizing Provider  HYDROcodone -acetaminophen  (NORCO) 7.5-325 MG tablet Take 0.5-1 tablets by mouth 2 (two) times daily as needed. 04/14/24  Yes Sater, Charlie LABOR, MD  levETIRAcetam  (KEPPRA ) 750 MG tablet Take 1 tablet (750 mg total) by mouth 2 (two) times daily. 03/02/24  Yes Sater, Charlie LABOR, MD  lisinopril  (ZESTRIL ) 10 MG tablet TAKE 1 TABLET(10 MG) BY MOUTH DAILY 07/13/22  Yes Duanne Butler DASEN, MD  loperamide  (IMODIUM ) 2 MG capsule Take 1 capsule (2 mg total) by mouth as needed for diarrhea or loose stools. 05/10/20  Yes [place], Theodoro FALCON, MD  metFORMIN  (GLUCOPHAGE ) 1000 MG tablet  TAKE 1 TABLET(1000 MG) BY MOUTH TWICE DAILY WITH A MEAL 04/29/21  Yes Chandra Raisin A, NP  omeprazole (PRILOSEC) 20 MG capsule Take 20 mg by mouth daily. 07/07/21  Yes [provider]  pioglitazone  (ACTOS ) 45 MG tablet Take 1 tablet (45 mg total) by mouth daily. 01/27/21  Yes Chandra Raisin A, NP  Semaglutide , 1 MG/DOSE, (OZEMPIC , 1 MG/DOSE,) 2 MG/1.5ML SOPN Inject 1 mg into the skin once a week. 08/05/20  Yes Chandra Raisin LABOR, NP  simvastatin  (ZOCOR ) 40 MG tablet TAKE 1 TABLET(40 MG) BY MOUTH DAILY 08/11/21  Yes Duanne Butler DASEN, MD  tiZANidine  (ZANAFLEX ) 2 MG tablet Take 1 tablet (2 mg total) by mouth every 8 (eight) hours as needed for muscle spasms. 12/30/23  Yes Sater, Charlie LABOR, MD  desmopressin  (DDAVP ) 0.1 MG tablet Take 1 tablet (0.1 mg total) by mouth at bedtime. 06/01/23   Sater, Charlie LABOR, MD  doxepin  (SINEQUAN ) 10 MG capsule Take 1 capsule (10 mg total) by mouth at bedtime. 12/09/23   Sater, Charlie LABOR, MD  LORazepam (ATIVAN) 1 MG tablet Take 1 mg by mouth every 3 (three) days. Patient not taking: Reported on 04/30/2024 02/04/24   [provider]    Physical Exam: BP 116/78 (BP Location: Right Arm) Comment: Simultaneous filing. User may not have seen previous data. Comment (BP Location): Simultaneous filing. User may not have seen previous data.  Pulse 76 Comment: Simultaneous filing. User may not have seen previous data.  Temp (!) 97.5 F (36.4 C) (Oral)   Resp 20 Comment: Simultaneous filing. User may not have seen previous data.  Ht 5' 4.5 (1.638 m)   Wt 89.6 kg   SpO2 97% Comment: Simultaneous filing. User may not have seen previous data.  BMI 33.38 kg/m   General: 78 y.o. year-old female well developed well nourished in no acute distress.  Alert and oriented x3. Cardiovascular: Regular rate and rhythm with no rubs or gallops.  No thyromegaly or JVD noted.  No lower extremity edema. 2/4 pulses in all 4 extremities. Respiratory: Clear to auscultation with no wheezes or rales. Good inspiratory effort. Abdomen: Soft nontender nondistended with normal bowel sounds x4 quadrants. Muskuloskeletal: No cyanosis, clubbing or edema noted bilaterally Neuro: CN II-XII intact, strength, sensation, reflexes Skin: No ulcerative lesions noted or rashes Psychiatry: Judgement and insight appear normal. Mood is appropriate for condition and setting          Labs on Admission:  Basic Metabolic Panel: Recent Labs  Lab 04/30/24 2034  NA 130*  K 6.1*  CL 96*  CO2 18*  GLUCOSE 134*  BUN 79*   CREATININE 5.46*  CALCIUM 12.5*  MG 1.9   Liver Function Tests: Recent Labs  Lab 04/30/24 2034  AST 35  ALT 18  ALKPHOS 38  BILITOT 0.4  PROT 6.3*  ALBUMIN 4.0   No results for input(s): LIPASE, AMYLASE in the last 168 hours. No results for input(s): AMMONIA in the last 168 hours. CBC: Recent Labs  Lab 04/30/24 2034  WBC 14.4*  NEUTROABS 11.9*  HGB 12.0  HCT 35.9*  MCV 90.4  PLT 258   Cardiac Enzymes: Recent Labs  Lab 04/30/24 2034  CKTOTAL 201    BNP (last 3 results) No results for input(s): BNP in the last 8760 hours.  ProBNP (last 3 results) No results for input(s): PROBNP in the last 8760 hours.  CBG: No results for input(s): GLUCAP in the last 168 hours.  Radiological Exams on Admission: CT ABDOMEN  PELVIS WO CONTRAST Result Date: 04/30/2024 EXAM: CT ABDOMEN AND PELVIS WITHOUT CONTRAST 04/30/2024 09:16:11 PM TECHNIQUE: CT of the abdomen and pelvis was performed without the administration of intravenous contrast. Multiplanar reformatted images are provided for review. Automated exposure control, iterative reconstruction, and/or weight-based adjustment of the mA/kV was utilized to reduce the radiation dose to as low as reasonably achievable. COMPARISON: None available. CLINICAL HISTORY: Bowel obstruction suspected; Sepsis. 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 LIVER: The liver is unremarkable. GALLBLADDER AND BILE DUCTS: Gallbladder is not visualized. No biliary ductal dilatation. SPLEEN: No acute abnormality. PANCREAS: No acute abnormality. ADRENAL GLANDS: There is an intramural left adrenal nodule measuring 1.9 x 1.4 cm. KIDNEYS, URETERS AND BLADDER: No stones in the kidneys or ureters. No hydronephrosis. No perinephric or periureteral stranding. Urinary bladder is unremarkable. GI AND BOWEL: Stomach demonstrates no acute abnormality. Sigmoid colon anastomosis is present. There is sigmoid and descending colon  diverticulosis. There is circumferential wall thickening of the entire colon more significant proximally. There is mild inflammatory stranding surrounding the ascending colon. The appendix was not definitively visualized. There is no bowel obstruction. PERITONEUM AND RETROPERITONEUM: No ascites. No free air. VASCULATURE: Aorta is normal in caliber. There are atherosclerotic calcifications of the aorta and iliac arteries. LYMPH NODES: No lymphadenopathy. REPRODUCTIVE ORGANS: Uterus is surgically absent. BONES AND SOFT TISSUES: No acute osseous abnormality. No focal soft tissue abnormality. IMPRESSION: 1. Pancolitis, greatest proximally. 2. Sigmoid and descending colon diverticulosis without diverticulitis. 3. Indeterminate 1.9 cm left adrenal nodule; recommend 1-year follow-up adrenal washout CT. 4. Partially imaged intraatrial lipoma. Electronically signed by: Greig Pique MD 04/30/2024 10:06 PM EST RP Workstation: [id number]   DG Chest Port 1 View Result Date: 04/30/2024 EXAM: 1 VIEW(S) XRAY OF THE CHEST 04/30/2024 08:28:32 PM COMPARISON: None available. CLINICAL HISTORY: weakness, fall FINDINGS: LUNGS AND PLEURA: No focal pulmonary opacity. No pleural effusion. No pneumothorax. HEART AND MEDIASTINUM: Aortic arch calcifications. BONES AND SOFT TISSUES: Multilevel thoracic osteophytosis. No acute osseous abnormality. IMPRESSION: 1. No acute cardiopulmonary abnormality. Electronically signed by: Morgane Naveau MD 04/30/2024 09:12 PM EST RP Workstation: [id number]    EKG: I independently viewed the EKG done and my findings are as followed: Ectopic atrial rhythm supraventricular bigeminy rate of 80.  QTc 395.  Assessment/Plan Present on Admission:  AKI (acute kidney injury)  Principal Problem:   AKI (acute kidney injury)  AKI on CKD 3B, prerenal in the setting of dehydration from poor oral intake and diarrhea. Baseline creatinine 1.9 with GFR 48.  Presented with creatinine of 5.44 with GFR of  8 Continue IV fluid hydration NS at 100 cc/h x 1 day Monitor urine output Avoid nephrotoxic agents, dehydration, and hypotension Repeat BMP in the morning.  Elevated troponin, suspect demand ischemia in the setting of the above  high-sensitivity troponin 126, trend No evidence of acute ischemia on twelve-lead EKG Follow transthoracic echocardiogram Monitor on telemetry  High anion gap metabolic acidosis secondary to acute renal insufficiency. Serum bicarb 18, anion gap 16 Continue IV fluid Repeat BMP in the morning  Hyperkalemia Serum potassium 6.0 Treated with IV insulin  and D50 W [place]  10 mg daily x 2. Repeat BMP in the morning.  Presumptive UTI, POA UA positive for pyuria Continue Rocephin  Follow urine culture for ID and sensitivities De-escalate antibiotics as able  MS, currently not on therapy Resume home Keppra  Fall precautions.  Hypertension Hold off home lisinopril  due to AKI and soft BPs Maintain MAP greater than 65 Continue to closely monitor  vital signs.  Type 2 diabetes with hyperglycemia Last hemoglobin A1c 7.2 on 10/03/2020 Update hemoglobin A1c. Heart healthy, low-fat diet.  Hyperlipidemia Resume home regimen   Critical care time: 55 minutes.   DVT prophylaxis: Subcu heparin  3 times daily.  Code Status: Full code.  Family Communication: None at bedside.  Disposition Plan: Admitted to telemetry unit  Consults called: EDP discussed the case with nephrology and cardiology.  Admission status: Inpatient status.   Status is: Inpatient The patient requires at least 2 midnights for further evaluation and treatment of present condition.   Terry LOISE Hurst MD [facility] Pager [phone]  If 7PM-7AM, please contact night-coverage [email or web address] Password [email or web address]  04/30/2024, 11:14 PM      [1]  Allergies Allergen Reactions   Latex Rash   "

## 2024-05-01 ENCOUNTER — Inpatient Hospital Stay (HOSPITAL_COMMUNITY)

## 2024-05-01 DIAGNOSIS — I2489 Other forms of acute ischemic heart disease: Secondary | ICD-10-CM | POA: Insufficient documentation

## 2024-05-01 DIAGNOSIS — E875 Hyperkalemia: Secondary | ICD-10-CM | POA: Insufficient documentation

## 2024-05-01 DIAGNOSIS — R197 Diarrhea, unspecified: Secondary | ICD-10-CM | POA: Insufficient documentation

## 2024-05-01 DIAGNOSIS — N179 Acute kidney failure, unspecified: Secondary | ICD-10-CM | POA: Diagnosis not present

## 2024-05-01 DIAGNOSIS — R8281 Pyuria: Secondary | ICD-10-CM | POA: Insufficient documentation

## 2024-05-01 DIAGNOSIS — I10 Essential (primary) hypertension: Secondary | ICD-10-CM | POA: Insufficient documentation

## 2024-05-01 DIAGNOSIS — N1831 Chronic kidney disease, stage 3a: Secondary | ICD-10-CM | POA: Insufficient documentation

## 2024-05-01 DIAGNOSIS — R569 Unspecified convulsions: Secondary | ICD-10-CM

## 2024-05-01 DIAGNOSIS — R7989 Other specified abnormal findings of blood chemistry: Secondary | ICD-10-CM | POA: Diagnosis not present

## 2024-05-01 LAB — TROPONIN T, HIGH SENSITIVITY
Troponin T High Sensitivity: 141 ng/L (ref 0–19)
Troponin T High Sensitivity: 169 ng/L (ref 0–19)
Troponin T High Sensitivity: 158 ng/L (ref 0–19)

## 2024-05-01 LAB — BASIC METABOLIC PANEL WITH GFR
Anion gap: 15 (ref 5–15)
BUN: 69 mg/dL — ABNORMAL HIGH (ref 8–23)
CO2: 15 mmol/L — ABNORMAL LOW (ref 22–32)
Calcium: 10.8 mg/dL — ABNORMAL HIGH (ref 8.9–10.3)
Chloride: 103 mmol/L (ref 98–111)
Creatinine, Ser: 5.4 mg/dL — ABNORMAL HIGH (ref 0.44–1.00)
GFR, Estimated: 8 mL/min — ABNORMAL LOW
Glucose, Bld: 101 mg/dL — ABNORMAL HIGH (ref 70–99)
Potassium: 5.1 mmol/L (ref 3.5–5.1)
Sodium: 132 mmol/L — ABNORMAL LOW (ref 135–145)

## 2024-05-01 LAB — COMPREHENSIVE METABOLIC PANEL WITH GFR
ALT: 16 U/L (ref 0–44)
AST: 29 U/L (ref 15–41)
Albumin: 3.6 g/dL (ref 3.5–5.0)
Alkaline Phosphatase: 35 U/L — ABNORMAL LOW (ref 38–126)
Anion gap: 11 (ref 5–15)
BUN: 79 mg/dL — ABNORMAL HIGH (ref 8–23)
CO2: 19 mmol/L — ABNORMAL LOW (ref 22–32)
Calcium: 11.6 mg/dL — ABNORMAL HIGH (ref 8.9–10.3)
Chloride: 100 mmol/L (ref 98–111)
Creatinine, Ser: 5.44 mg/dL — ABNORMAL HIGH (ref 0.44–1.00)
GFR, Estimated: 8 mL/min — ABNORMAL LOW
Glucose, Bld: 117 mg/dL — ABNORMAL HIGH (ref 70–99)
Potassium: 5.7 mmol/L — ABNORMAL HIGH (ref 3.5–5.1)
Sodium: 130 mmol/L — ABNORMAL LOW (ref 135–145)
Total Bilirubin: 0.3 mg/dL (ref 0.0–1.2)
Total Protein: 5.7 g/dL — ABNORMAL LOW (ref 6.5–8.1)

## 2024-05-01 LAB — ECHOCARDIOGRAM COMPLETE
AR max vel: 2.64 cm2
AV Area VTI: 2.32 cm2
AV Area mean vel: 2.95 cm2
AV Mean grad: 10 mmHg
AV Peak grad: 20.1 mmHg
Ao pk vel: 2.24 m/s
Area-P 1/2: 3.14 cm2
Height: 64.5 in
S' Lateral: 2.9 cm
Weight: 3160.51 [oz_av]

## 2024-05-01 LAB — GLUCOSE, CAPILLARY
Glucose-Capillary: 139 mg/dL — ABNORMAL HIGH (ref 70–99)
Glucose-Capillary: 120 mg/dL — ABNORMAL HIGH (ref 70–99)

## 2024-05-01 LAB — CBG MONITORING, ED
Glucose-Capillary: 94 mg/dL (ref 70–99)
Glucose-Capillary: 102 mg/dL — ABNORMAL HIGH (ref 70–99)
Glucose-Capillary: 123 mg/dL — ABNORMAL HIGH (ref 70–99)

## 2024-05-01 LAB — CBC
HCT: 32.3 % — ABNORMAL LOW (ref 36.0–46.0)
Hemoglobin: 10.6 g/dL — ABNORMAL LOW (ref 12.0–15.0)
MCH: 30.2 pg (ref 26.0–34.0)
MCHC: 32.8 g/dL (ref 30.0–36.0)
MCV: 92 fL (ref 80.0–100.0)
Platelets: 232 K/uL (ref 150–400)
RBC: 3.51 MIL/uL — ABNORMAL LOW (ref 3.87–5.11)
RDW: 14.2 % (ref 11.5–15.5)
WBC: 10.2 K/uL (ref 4.0–10.5)
nRBC: 0 % (ref 0.0–0.2)

## 2024-05-01 LAB — HEMOGLOBIN A1C
Hgb A1c MFr Bld: 7 % — ABNORMAL HIGH (ref 4.8–5.6)
Mean Plasma Glucose: 154.2 mg/dL

## 2024-05-01 MED ORDER — ENSURE PLUS HIGH PROTEIN PO LIQD
237.0000 mL | Freq: Two times a day (BID) | ORAL | Status: DC
  Administered 2024-05-02 – 2024-05-26 (×27): 237 mL via ORAL

## 2024-05-01 MED ORDER — SODIUM CHLORIDE 0.9 % IV SOLN
1.0000 g | INTRAVENOUS | Status: AC
  Administered 2024-05-01 – 2024-05-05 (×5): 1 g via INTRAVENOUS
  Filled 2024-05-01 (×6): qty 10

## 2024-05-01 MED ORDER — ASPIRIN 325 MG PO TABS
325.0000 mg | ORAL_TABLET | Freq: Once | ORAL | Status: AC
  Administered 2024-05-01: 325 mg via ORAL
  Filled 2024-05-01: qty 1

## 2024-05-01 MED ORDER — MIDODRINE HCL 5 MG PO TABS
10.0000 mg | ORAL_TABLET | Freq: Two times a day (BID) | ORAL | Status: DC
  Administered 2024-05-01 – 2024-05-02 (×2): 10 mg via ORAL
  Filled 2024-05-01 (×2): qty 2

## 2024-05-01 MED ORDER — DEXTROSE 50 % IV SOLN
50.0000 mL | Freq: Once | INTRAVENOUS | Status: AC
  Administered 2024-05-01: 50 mL via INTRAVENOUS
  Filled 2024-05-01: qty 50

## 2024-05-01 MED ORDER — PERFLUTREN LIPID MICROSPHERE
1.0000 mL | INTRAVENOUS | Status: DC | PRN
  Administered 2024-05-01: 4 mL via INTRAVENOUS

## 2024-05-01 MED ORDER — INSULIN ASPART 100 UNIT/ML IV SOLN
5.0000 [IU] | Freq: Once | INTRAVENOUS | Status: AC
  Administered 2024-05-01: 5 [IU] via INTRAVENOUS
  Filled 2024-05-01: qty 5

## 2024-05-01 MED ORDER — CHLORHEXIDINE GLUCONATE CLOTH 2 % EX PADS
6.0000 | MEDICATED_PAD | Freq: Every day | CUTANEOUS | Status: DC
  Administered 2024-05-01 – 2024-05-15 (×12): 6 via TOPICAL

## 2024-05-01 MED ORDER — SODIUM ZIRCONIUM CYCLOSILICATE 10 G PO PACK
10.0000 g | PACK | Freq: Every day | ORAL | Status: DC
  Administered 2024-05-01: 10 g via ORAL
  Filled 2024-05-01: qty 1

## 2024-05-01 MED ORDER — SODIUM CHLORIDE 0.9 % IV BOLUS
1000.0000 mL | Freq: Once | INTRAVENOUS | Status: AC
  Administered 2024-05-01: 1000 mL via INTRAVENOUS

## 2024-05-01 MED ORDER — MIDODRINE HCL 5 MG PO TABS
10.0000 mg | ORAL_TABLET | ORAL | Status: AC
  Administered 2024-05-01: 10 mg via ORAL
  Filled 2024-05-01: qty 2

## 2024-05-01 MED ORDER — SODIUM CHLORIDE 0.9 % IV SOLN: INTRAVENOUS | Status: DC

## 2024-05-01 NOTE — ED Notes (Signed)
Pt given her breakfast tray.  

## 2024-05-01 NOTE — Progress Notes (Signed)
 " PROGRESS NOTE    Sonya Solomon  FMW:[id number] DOB: 1945-08-15 DOA: 04/30/2024 PCP: Leonel Cole, MD     Brief Narrative:  Sonya Solomon is a 78 y.o. female with medical history significant for MS, type 2 diabetes, hyperlipidemia, hypertension, obesity, history of gait disturbance, vertigo, urinary urgency, spasticity, insomnia, who presents to the ER due to generalized weakness, dizziness, and a fall.  Endorses being on the floor for about 4 hours until her husband could get help.  Endorses recent onset of diarrhea.  She has not been eating or drinking very well because it goes right through her, she cannot get to the bathroom quickly enough because of her mobility issues.  She has not wanted to eat and then subsequently have diarrhea.    Upon EMS arrival, the patient was hypotensive with SBP in 70s and DBP 40.  Received 1250 cc of IV LR en route via EMS.  Patient found to have AKI with elevated creatinine 5.4, hyperkalemia with potassium 6.1 as well as elevated troponin of 126.  Case was discussed with cardiology and nephrology, recommended IV fluid as well as to trend troponin.  New events last 24 hours / Subjective: Patient seen in the emergency department.  No further diarrhea today.  Appears weak.  For some reason, her IV fluid has been disconnected.  Asked staff to put her back on IV fluid.   Assessment & Plan:   Principal Problem:   AKI (acute kidney injury) Active Problems:   MS (multiple sclerosis)   Hyperlipidemia associated with type 2 diabetes mellitus (HCC)   CKD stage 3a, GFR 45-59 ml/min (HCC)   Hyperkalemia   Demand ischemia (HCC)   Pyuria   Diarrhea   Seizure (HCC)   HTN (hypertension)   AKI on CKD stage IIIa - Baseline creatinine 1.1 - Insetting of dehydration, poor oral intake, diarrhea - Continue IV fluid - Avoid nephrotoxins, trend BMP  Hyperkalemia - Resolved  Demand ischemia - Troponin trend relatively flat 126, 141, 169 - Echocardiogram negative  for regional wall motion abnormality, EF 70 to 75%, grade 1 diastolic dysfunction, elevated pulmonary artery systolic pressure, LV hypertrophy  Pyuria - Urine culture pending - Empiric Rocephin    Diarrhea - C. difficile negative - GI PCR panel ordered  Seizure disorder - Keppra   MS - PT OT  Hypertension - Lisinopril  on hold due to AKI  Diabetes mellitus - A1c 7.0 - Sliding scale insulin   Hyperlipidemia - Zocor     DVT prophylaxis:  heparin  injection 5,000 Units Start: 05/01/24 0600  Code Status: Full code Family Communication: None Disposition Plan: Home Status is: Inpatient Remains inpatient appropriate   Antimicrobials:  Anti-infectives (From admission, onward)    Start     Dose/Rate Route Frequency Ordered Stop   05/01/24 2200  cefTRIAXone  (ROCEPHIN ) 1 g in sodium chloride  0.9 % 100 mL IVPB        1 g 200 mL/hr over 30 Minutes Intravenous Every 24 hours 05/01/24 0439     04/30/24 2300  cefTRIAXone  (ROCEPHIN ) 1 g in sodium chloride  0.9 % 100 mL IVPB        1 g 200 mL/hr over 30 Minutes Intravenous  Once 04/30/24 2255 04/30/24 2329        Objective: Vitals:   05/01/24 1200 05/01/24 1210 05/01/24 1213 05/01/24 1215  BP:   (!) 98/39 (!) 98/39  Pulse:  85 100 71  Resp: 17  16 19   Temp:  97.9 F (36.6 C) 97.8 F (36.6  C)   TempSrc:  Oral Oral   SpO2:    99%  Weight:      Height:        Intake/Output Summary (Last 24 hours) at 05/01/2024 1453 Last data filed at 05/01/2024 0159 Gross per 24 hour  Intake 1100 ml  Output --  Net 1100 ml   Filed Weights   04/30/24 2001 04/30/24 2015  Weight: 89.6 kg 89.6 kg    Examination:  General exam: Appears calm and comfortable, frail appearing  Respiratory system: Clear to auscultation. Respiratory effort normal. No respiratory distress. No conversational dyspnea.  Cardiovascular system: S1 & S2 heard, RRR. No murmurs. No pedal edema. Gastrointestinal system: Abdomen is nondistended, soft and nontender.  Normal bowel sounds heard. Central nervous system: Alert and oriented. No focal neurological deficits. Speech clear.  Extremities: Symmetric in appearance  Skin: No rashes, lesions or ulcers on exposed skin  Psychiatry: Judgement and insight appear normal. Mood & affect appropriate.   Data Reviewed: I have personally reviewed following labs and imaging studies  CBC: Recent Labs  Lab 04/30/24 2034 05/01/24 0804  WBC 14.4* 10.2  NEUTROABS 11.9*  --   HGB 12.0 10.6*  HCT 35.9* 32.3*  MCV 90.4 92.0  PLT 258 232   Basic Metabolic Panel: Recent Labs  Lab 04/30/24 0022 04/30/24 2034 05/01/24 0804  NA 130* 130* 132*  K 5.7* 6.1* 5.1  CL 100 96* 103  CO2 19* 18* 15*  GLUCOSE 117* 134* 101*  BUN 79* 79* 69*  CREATININE 5.44* 5.46* 5.40*  CALCIUM 11.6* 12.5* 10.8*  MG  --  1.9  --    GFR: Estimated Creatinine Clearance: 9.7 mL/min (A) (by C-G formula based on SCr of 5.4 mg/dL (H)). Liver Function Tests: Recent Labs  Lab 04/30/24 0022 04/30/24 2034  AST 29 35  ALT 16 18  ALKPHOS 35* 38  BILITOT 0.3 0.4  PROT 5.7* 6.3*  ALBUMIN 3.6 4.0   No results for input(s): LIPASE, AMYLASE in the last 168 hours. No results for input(s): AMMONIA in the last 168 hours. Coagulation Profile: No results for input(s): INR, PROTIME in the last 168 hours. Cardiac Enzymes: Recent Labs  Lab 04/30/24 2034  CKTOTAL 201   BNP (last 3 results) No results for input(s): PROBNP in the last 8760 hours. HbA1C: Recent Labs    04/30/24 0022  HGBA1C 7.0*   CBG: Recent Labs  Lab 04/30/24 2346 05/01/24 0142 05/01/24 0811 05/01/24 1208  GLUCAP 115* 94 102* 123*   Lipid Profile: No results for input(s): CHOL, HDL, LDLCALC, TRIG, CHOLHDL, LDLDIRECT in the last 72 hours. Thyroid Function Tests: No results for input(s): TSH, T4TOTAL, FREET4, T3FREE, THYROIDAB in the last 72 hours. Anemia Panel: No results for input(s): VITAMINB12, FOLATE, FERRITIN,  TIBC, IRON, RETICCTPCT in the last 72 hours. Sepsis Labs: No results for input(s): PROCALCITON, LATICACIDVEN in the last 168 hours.  Recent Results (from the past 240 hours)  C Difficile Quick Screen w PCR reflex     Status: None   Collection Time: 04/30/24 10:05 PM   Specimen: STOOL  Result Value Ref Range Status   C Diff antigen NEGATIVE NEGATIVE Final   C Diff toxin NEGATIVE NEGATIVE Final   C Diff interpretation No C. difficile detected.  Final    Comment: Performed at [facility], [address] [place], [place] [address]  Blood culture (routine x 2)     Status: None (Preliminary result)   Collection Time: 05/01/24  4:00 AM  Specimen: BLOOD  Result Value Ref Range Status   Specimen Description   Final    BLOOD BLOOD RIGHT ARM Performed at [facility], [address] [place], [place] [address]    Special Requests   Final    BOTTLES DRAWN AEROBIC ONLY Blood Culture results may not be optimal due to an inadequate volume of blood received in culture bottles Performed at [facility], [address], [place], [place] [address]    Culture   Final    NO GROWTH < 12 HOURS Performed at [facility], [address] [place], [place] [address]    Report Status PENDING  Incomplete  Blood culture (routine x 2)     Status: None (Preliminary result)   Collection Time: 05/01/24  4:04 AM   Specimen: BLOOD  Result Value Ref Range Status   Specimen Description   Final    BLOOD LEFT ANTECUBITAL Performed at [facility], [address] [place], [place] [address]    Special Requests   Final    BOTTLES DRAWN AEROBIC AND ANAEROBIC Blood Culture results may not be optimal due to an inadequate volume of blood received in culture bottles Performed at [facility], [address], [place], [place] [address]    Culture   Final    NO GROWTH < 12 HOURS Performed at [facility], [address], [place], [place] [address]    Report Status PENDING  Incomplete      Radiology Studies: ECHOCARDIOGRAM COMPLETE Result Date: 05/01/2024    ECHOCARDIOGRAM REPORT   Patient Name:   HEDWIG MCFALL Date of Exam: 05/01/2024 Medical Rec #:  [id number]       Height:       64.5 in Accession #:    [id number]      Weight:       197.5 lb Date of Birth:  1945/11/05        BSA:          1.956 m Patient Age:    78 years        BP:           97/69 mmHg Patient Gender: F               HR:           73 bpm. Exam Location:  Inpatient Procedure: 2D Echo, Cardiac Doppler, Color Doppler and Intracardiac            Opacification Agent (Both Spectral and Color Flow Doppler were            utilized during procedure). Indications:    Elevated troponin  History:        Patient has no prior history of Echocardiogram examinations.                 Risk Factors:Hypertension and Diabetes.  Sonographer:    Merlynn Manas Referring Phys: [id number] TERRY LOISE HURST  Sonographer Comments: Image acquisition challenging due to respiratory motion. IMPRESSIONS  1. Significant LV hypertrophy, especially apically. 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 CMR may be helpful for further evaluation. Left ventricular ejection fraction, by estimation, is 70 to 75%. The left ventricle has hyperdynamic function. The left ventricle has no regional wall motion abnormalities. There is moderate concentric left ventricular hypertrophy. Left ventricular diastolic parameters are consistent with Grade I diastolic dysfunction (impaired relaxation).  2. Right ventricular systolic function is normal.  The right ventricular size is normal. Mildly increased right ventricular wall thickness. There is severely elevated pulmonary artery systolic pressure. The estimated right ventricular systolic pressure is 68.0 mmHg.  3. Left atrial size was mildly dilated.  4. The mitral valve is normal in structure. Mild mitral valve regurgitation. No evidence  of mitral stenosis.  5. The aortic valve is normal in structure. Aortic valve regurgitation is not visualized. Mild aortic valve stenosis. Aortic valve Vmax measures 2.24 m/s.  6. The inferior vena cava is normal in size with greater than 50% respiratory variability, suggesting right atrial pressure of 3 mmHg. FINDINGS  Left Ventricle: Significant LV hypertrophy, especially apically. 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 CMR may be helpful for further evaluation. Left ventricular ejection fraction, by estimation, is 70 to 75%. The left ventricle has hyperdynamic function. The left ventricle has no regional wall motion abnormalities. Definity  contrast agent was given IV to delineate the left ventricular endocardial borders. The left ventricular internal cavity size was normal in size. There is moderate concentric left ventricular hypertrophy. Left ventricular diastolic parameters are consistent with Grade I diastolic dysfunction (impaired relaxation). Right Ventricle: The right ventricular size is normal. Mildly increased right ventricular wall thickness. Right ventricular systolic function is normal. There is severely elevated pulmonary artery systolic pressure. The tricuspid regurgitant velocity is 4.03 m/s, and with an assumed right atrial pressure of 3 mmHg, the estimated right ventricular systolic pressure is 68.0 mmHg. Left Atrium: Left atrial size was mildly dilated. Right Atrium: Right atrial size was normal in size. Pericardium: There is no evidence of pericardial effusion. Mitral Valve: The mitral valve is normal in structure. Mild mitral valve regurgitation. No evidence of mitral valve stenosis. Tricuspid Valve: The tricuspid valve is normal in structure. Tricuspid valve regurgitation is mild . No evidence of tricuspid stenosis. Aortic Valve: The aortic valve is normal in structure. Aortic valve regurgitation is not visualized. Mild aortic stenosis is present. Aortic valve mean gradient  measures 10.0 mmHg. Aortic valve peak gradient measures 20.1 mmHg. Aortic valve area, by VTI measures 2.32 cm. Pulmonic Valve: The pulmonic valve was normal in structure. Pulmonic valve regurgitation is not visualized. No evidence of pulmonic stenosis. Aorta: The aortic root is normal in size and structure. Venous: The inferior vena cava is normal in size with greater than 50% respiratory variability, suggesting right atrial pressure of 3 mmHg. IAS/Shunts: No atrial level shunt detected by color flow Doppler.  LEFT VENTRICLE PLAX 2D LVIDd:         5.00 cm   Diastology LVIDs:         2.90 cm   LV e' medial:    4.90 cm/s LV PW:         0.90 cm   LV E/e' medial:  11.9 LV IVS:        0.90 cm   LV e' lateral:   7.18 cm/s LVOT diam:     2.00 cm   LV E/e' lateral: 8.1 LV SV:         99 LV SV Index:   50 LVOT Area:     3.14 cm  RIGHT VENTRICLE             IVC RV Basal diam:  3.70 cm     IVC diam: 1.60 cm RV S prime:     18.60 cm/s TAPSE (M-mode): 2.5 cm      PULMONARY VEINS  Diastolic Velocity: 51.40 cm/s                             S/D Velocity:       1.40                             Systolic Velocity:  71.10 cm/s LEFT ATRIUM             Index        RIGHT ATRIUM           Index LA diam:        3.90 cm 1.99 cm/m   RA Area:     15.60 cm LA Vol (A2C):   52.0 ml 26.58 ml/m  RA Volume:   42.70 ml  21.82 ml/m LA Vol (A4C):   62.2 ml 31.79 ml/m LA Biplane Vol: 59.4 ml 30.36 ml/m  AORTIC VALVE AV Area (Vmax):    2.64 cm AV Area (Vmean):   2.95 cm AV Area (VTI):     2.32 cm AV Vmax:           224.00 cm/s AV Vmean:          148.000 cm/s AV VTI:            0.425 m AV Peak Grad:      20.1 mmHg AV Mean Grad:      10.0 mmHg LVOT Vmax:         188.00 cm/s LVOT Vmean:        139.000 cm/s LVOT VTI:          0.314 m LVOT/AV VTI ratio: 0.74  AORTA Ao Root diam: 3.50 cm Ao Asc diam:  3.80 cm MITRAL VALVE               TRICUSPID VALVE MV Area (PHT): 3.14 cm    TR Peak grad:   65.0 mmHg MV E velocity:  58.10 cm/s  TR Vmax:        403.00 cm/s MV A velocity: 85.70 cm/s MV E/A ratio:  0.68        SHUNTS                            Systemic VTI:  0.31 m                            Systemic Diam: 2.00 cm Morene Brownie Electronically signed by Morene Brownie Signature Date/Time: 05/01/2024/1:42:17 PM    Final    CT ABDOMEN PELVIS WO CONTRAST Result Date: 04/30/2024 EXAM: CT ABDOMEN AND PELVIS WITHOUT CONTRAST 04/30/2024 09:16:11 PM TECHNIQUE: CT of the abdomen and pelvis was performed without the administration of intravenous contrast. Multiplanar reformatted images are provided for review. Automated exposure control, iterative reconstruction, and/or weight-based adjustment of the mA/kV was utilized to reduce the radiation dose to as low as reasonably achievable. COMPARISON: None available. CLINICAL HISTORY: Bowel obstruction suspected; Sepsis. 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 LIVER: The liver is unremarkable. GALLBLADDER AND BILE DUCTS: Gallbladder is not visualized. No biliary ductal dilatation. SPLEEN: No acute abnormality. PANCREAS: No acute abnormality. ADRENAL GLANDS: There is an intramural left adrenal nodule measuring 1.9 x 1.4 cm. KIDNEYS, URETERS AND BLADDER: No stones in the kidneys or ureters. No hydronephrosis. No perinephric or periureteral stranding.  Urinary bladder is unremarkable. GI AND BOWEL: Stomach demonstrates no acute abnormality. Sigmoid colon anastomosis is present. There is sigmoid and descending colon diverticulosis. There is circumferential wall thickening of the entire colon more significant proximally. There is mild inflammatory stranding surrounding the ascending colon. The appendix was not definitively visualized. There is no bowel obstruction. PERITONEUM AND RETROPERITONEUM: No ascites. No free air. VASCULATURE: Aorta is normal in caliber. There are atherosclerotic calcifications of the aorta and iliac arteries. LYMPH NODES: No  lymphadenopathy. REPRODUCTIVE ORGANS: Uterus is surgically absent. BONES AND SOFT TISSUES: No acute osseous abnormality. No focal soft tissue abnormality. IMPRESSION: 1. Pancolitis, greatest proximally. 2. Sigmoid and descending colon diverticulosis without diverticulitis. 3. Indeterminate 1.9 cm left adrenal nodule; recommend 1-year follow-up adrenal washout CT. 4. Partially imaged intraatrial lipoma. Electronically signed by: Greig Pique MD 04/30/2024 10:06 PM EST RP Workstation: [id number]   DG Chest Port 1 View Result Date: 04/30/2024 EXAM: 1 VIEW(S) XRAY OF THE CHEST 04/30/2024 08:28:32 PM COMPARISON: None available. CLINICAL HISTORY: weakness, fall FINDINGS: LUNGS AND PLEURA: No focal pulmonary opacity. No pleural effusion. No pneumothorax. HEART AND MEDIASTINUM: Aortic arch calcifications. BONES AND SOFT TISSUES: Multilevel thoracic osteophytosis. No acute osseous abnormality. IMPRESSION: 1. No acute cardiopulmonary abnormality. Electronically signed by: Morgane Naveau MD 04/30/2024 09:12 PM EST RP Workstation: [id number]      Scheduled Meds:  heparin   5,000 Units Subcutaneous Q8H   insulin  aspart  0-5 Units Subcutaneous QHS   insulin  aspart  0-6 Units Subcutaneous TID WC   levETIRAcetam   750 mg Oral BID   pantoprazole   40 mg Oral Daily   simvastatin   40 mg Oral q1800   Continuous Infusions:  sodium chloride  125 mL/hr at 05/01/24 1217   cefTRIAXone  (ROCEPHIN )  IV       LOS: 1 day   Time spent: 35 minutes   Delon Hoe, DO [facility] 05/01/2024, 2:53 PM   Available via Epic secure chat 7am-7pm After these hours, please refer to coverage provider listed on [email or web address]  "

## 2024-05-02 DIAGNOSIS — N179 Acute kidney failure, unspecified: Secondary | ICD-10-CM | POA: Diagnosis not present

## 2024-05-02 LAB — BASIC METABOLIC PANEL WITH GFR
Anion gap: 11 (ref 5–15)
BUN: 74 mg/dL — ABNORMAL HIGH (ref 8–23)
CO2: 14 mmol/L — ABNORMAL LOW (ref 22–32)
Calcium: 10.4 mg/dL — ABNORMAL HIGH (ref 8.9–10.3)
Chloride: 107 mmol/L (ref 98–111)
Creatinine, Ser: 5.29 mg/dL — ABNORMAL HIGH (ref 0.44–1.00)
GFR, Estimated: 8 mL/min — ABNORMAL LOW
Glucose, Bld: 115 mg/dL — ABNORMAL HIGH (ref 70–99)
Potassium: 4.9 mmol/L (ref 3.5–5.1)
Sodium: 133 mmol/L — ABNORMAL LOW (ref 135–145)

## 2024-05-02 LAB — GLUCOSE, CAPILLARY
Glucose-Capillary: 129 mg/dL — ABNORMAL HIGH (ref 70–99)
Glucose-Capillary: 188 mg/dL — ABNORMAL HIGH (ref 70–99)
Glucose-Capillary: 208 mg/dL — ABNORMAL HIGH (ref 70–99)

## 2024-05-02 MED ORDER — HYDROCORTISONE SOD SUC (PF) 100 MG IJ SOLR
100.0000 mg | Freq: Three times a day (TID) | INTRAMUSCULAR | Status: DC
  Administered 2024-05-02 – 2024-05-04 (×6): 100 mg via INTRAVENOUS
  Filled 2024-05-02 (×6): qty 2

## 2024-05-02 MED ORDER — MIDODRINE HCL 5 MG PO TABS
10.0000 mg | ORAL_TABLET | Freq: Three times a day (TID) | ORAL | Status: DC
  Administered 2024-05-02 – 2024-05-05 (×10): 10 mg via ORAL
  Filled 2024-05-02 (×10): qty 2

## 2024-05-02 MED ORDER — LEVETIRACETAM 500 MG PO TABS
500.0000 mg | ORAL_TABLET | Freq: Every day | ORAL | Status: DC
  Administered 2024-05-02 – 2024-05-03 (×2): 500 mg via ORAL
  Filled 2024-05-02 (×2): qty 1

## 2024-05-02 MED ORDER — SODIUM CHLORIDE 0.9 % IV BOLUS
1000.0000 mL | Freq: Once | INTRAVENOUS | Status: AC
  Administered 2024-05-02: 1000 mL via INTRAVENOUS

## 2024-05-02 NOTE — Plan of Care (Signed)
" °  Problem: Coping: Goal: Ability to adjust to condition or change in health will improve Outcome: Progressing   Problem: Fluid Volume: Goal: Ability to maintain a balanced intake and output will improve Outcome: Progressing   Problem: Health Behavior/Discharge Planning: Goal: Ability to manage health-related needs will improve Outcome: Progressing   Problem: Health Behavior/Discharge Planning: Goal: Ability to manage health-related needs will improve Outcome: Progressing   "

## 2024-05-02 NOTE — Progress Notes (Signed)
 PT Cancellation Note  Patient Details Name: Sonya Solomon MRN: [id number] DOB: 05-08-1945   Cancelled Treatment:    Reason Eval/Treat Not Completed: Medical issues which prohibited therapy Per RN, BP soft, . Check back when stable for mobility.  Darice Potters PT Acute Rehabilitation Services Office [phone]   Potters Darice Norris 05/02/2024, 11:41 AM

## 2024-05-02 NOTE — Plan of Care (Signed)
" °  Problem: Metabolic: Goal: Ability to maintain appropriate glucose levels will improve Outcome: Progressing   Problem: Clinical Measurements: Goal: Respiratory complications will improve Outcome: Progressing   Problem: Pain Managment: Goal: General experience of comfort will improve and/or be controlled Outcome: Progressing   Problem: Coping: Goal: Level of anxiety will decrease Outcome: Not Progressing   Problem: Elimination: Goal: Will not experience complications related to bowel motility Outcome: Not Progressing   "

## 2024-05-02 NOTE — Progress Notes (Signed)
 " PROGRESS NOTE    Sonya Solomon  FMW:[id number] DOB: 1946-01-16 DOA: 04/30/2024 PCP: Leonel Cole, MD     Brief Narrative:  Sonya Solomon is a 78 y.o. female with medical history significant for MS, type 2 diabetes, hyperlipidemia, hypertension, obesity, history of gait disturbance, vertigo, urinary urgency, spasticity, insomnia, who presents to the ER due to generalized weakness, dizziness, and a fall.  Endorses being on the floor for about 4 hours until her husband could get help.  Endorses recent onset of diarrhea.  She has not been eating or drinking very well because it goes right through her, she cannot get to the bathroom quickly enough because of her mobility issues.  She has not wanted to eat and then subsequently have diarrhea.    Upon EMS arrival, the patient was hypotensive with SBP in 70s and DBP 40.  Received 1250 cc of IV LR en route via EMS.  Patient found to have AKI with elevated creatinine 5.4, hyperkalemia with potassium 6.1 as well as elevated troponin of 126.  Case was discussed with cardiology and nephrology, recommended IV fluid as well as to trend troponin.  New events last 24 hours / Subjective: Patient feeling better today.  She was able to have meals yesterday without any diarrhea.  Foley was placed yesterday.  At baseline, she ambulates using a walker and a cane.  She is independent of ADLs, but does not cook or clean the house.  She is able to shower and get washed up on her own.  Lives at home with spouse.  Assessment & Plan:   Principal Problem:   AKI (acute kidney injury) Active Problems:   MS (multiple sclerosis)   Hyperlipidemia associated with type 2 diabetes mellitus (HCC)   CKD stage 3a, GFR 45-59 ml/min (HCC)   Hyperkalemia   Demand ischemia (HCC)   Pyuria   Diarrhea   Seizure (HCC)   HTN (hypertension)   AKI on CKD stage IIIa - Baseline creatinine 1.1 - Insetting of dehydration, poor oral intake, diarrhea - Continue IV fluid - Avoid  nephrotoxins, trend BMP - Foley in place, continue to trend urine output  Hypotension - IV fluid bolus and midodrine   E. coli UTI, POA  - Urine culture showing E. coli, susceptibility pending - Empiric Rocephin    Hyperkalemia - Resolved  Demand ischemia - Troponin trend relatively flat 126, 141, 169 - Echocardiogram negative for regional wall motion abnormality, EF 70 to 75%, grade 1 diastolic dysfunction, elevated pulmonary artery systolic pressure, LV hypertrophy  Diarrhea - C. difficile negative - GI PCR panel ordered but has not had a bowel movement since admission.  If no bowel movement today, can cancel GI PCR panel and enteric precaution  Seizure disorder - Keppra , dose adjusted for renal function  MS - PT OT  Hypertension - Lisinopril  on hold due to AKI  Diabetes mellitus - A1c 7.0 - Sliding scale insulin   Hyperlipidemia - Zocor     DVT prophylaxis:  heparin  injection 5,000 Units Start: 05/01/24 0600  Code Status: Full code Family Communication: None Disposition Plan: Home Status is: Inpatient Remains inpatient appropriate   Antimicrobials:  Anti-infectives (From admission, onward)    Start     Dose/Rate Route Frequency Ordered Stop   05/01/24 2200  cefTRIAXone  (ROCEPHIN ) 1 g in sodium chloride  0.9 % 100 mL IVPB        1 g 200 mL/hr over 30 Minutes Intravenous Every 24 hours 05/01/24 0439     04/30/24 2300  cefTRIAXone  (ROCEPHIN ) 1 g in sodium chloride  0.9 % 100 mL IVPB        1 g 200 mL/hr over 30 Minutes Intravenous  Once 04/30/24 2255 04/30/24 2329        Objective: Vitals:   05/02/24 1115 05/02/24 1130 05/02/24 1145 05/02/24 1200  BP:  (!) 81/35 (!) 71/31 (!) 99/41  Pulse:   64 73  Resp: 15 17 15 19   Temp:      TempSrc:      SpO2:  99% 100% 100%  Weight:      Height:        Intake/Output Summary (Last 24 hours) at 05/02/2024 1211 Last data filed at 05/02/2024 0940 Gross per 24 hour  Intake 2680.72 ml  Output 550 ml  Net  2130.72 ml   Filed Weights   04/30/24 2001 04/30/24 2015  Weight: 89.6 kg 89.6 kg    Examination:  General exam: Appears calm and comfortable, frail appearing  Respiratory system: Clear to auscultation. Respiratory effort normal. No respiratory distress. No conversational dyspnea.  Cardiovascular system: S1 & S2 heard, RRR. No murmurs. No pedal edema. Gastrointestinal system: Abdomen is nondistended, soft and nontender. Normal bowel sounds heard. Central nervous system: Alert and oriented. No focal neurological deficits. Speech clear.  Extremities: Symmetric in appearance  Skin: No rashes, lesions or ulcers on exposed skin  Psychiatry: Judgement and insight appear normal. Mood & affect appropriate.   Data Reviewed: I have personally reviewed following labs and imaging studies  CBC: Recent Labs  Lab 04/30/24 2034 05/01/24 0804  WBC 14.4* 10.2  NEUTROABS 11.9*  --   HGB 12.0 10.6*  HCT 35.9* 32.3*  MCV 90.4 92.0  PLT 258 232   Basic Metabolic Panel: Recent Labs  Lab 04/30/24 0022 04/30/24 2034 05/01/24 0804 05/02/24 0251  NA 130* 130* 132* 133*  K 5.7* 6.1* 5.1 4.9  CL 100 96* 103 107  CO2 19* 18* 15* 14*  GLUCOSE 117* 134* 101* 115*  BUN 79* 79* 69* 74*  CREATININE 5.44* 5.46* 5.40* 5.29*  CALCIUM 11.6* 12.5* 10.8* 10.4*  MG  --  1.9  --   --    GFR: Estimated Creatinine Clearance: 9.9 mL/min (A) (by C-G formula based on SCr of 5.29 mg/dL (H)). Liver Function Tests: Recent Labs  Lab 04/30/24 0022 04/30/24 2034  AST 29 35  ALT 16 18  ALKPHOS 35* 38  BILITOT 0.3 0.4  PROT 5.7* 6.3*  ALBUMIN 3.6 4.0   No results for input(s): LIPASE, AMYLASE in the last 168 hours. No results for input(s): AMMONIA in the last 168 hours. Coagulation Profile: No results for input(s): INR, PROTIME in the last 168 hours. Cardiac Enzymes: Recent Labs  Lab 04/30/24 2034  CKTOTAL 201   BNP (last 3 results) No results for input(s): PROBNP in the last 8760  hours. HbA1C: Recent Labs    04/30/24 0022  HGBA1C 7.0*   CBG: Recent Labs  Lab 05/01/24 0811 05/01/24 1208 05/01/24 1727 05/01/24 2202 05/02/24 0752  GLUCAP 102* 123* 139* 120* 129*   Lipid Profile: No results for input(s): CHOL, HDL, LDLCALC, TRIG, CHOLHDL, LDLDIRECT in the last 72 hours. Thyroid Function Tests: No results for input(s): TSH, T4TOTAL, FREET4, T3FREE, THYROIDAB in the last 72 hours. Anemia Panel: No results for input(s): VITAMINB12, FOLATE, FERRITIN, TIBC, IRON, RETICCTPCT in the last 72 hours. Sepsis Labs: No results for input(s): PROCALCITON, LATICACIDVEN in the last 168 hours.  Recent Results (from the past 240 hours)  Urine Culture  Status: Abnormal (Preliminary result)   Collection Time: 04/30/24  8:24 PM   Specimen: Urine, Random  Result Value Ref Range Status   Specimen Description   Final    URINE, RANDOM Performed at [facility], [address] [place], [place] [address]    Special Requests   Final    NONE Reflexed from [id number] Performed at [facility] [address], [place], [place] [address]    Culture >=100,000 COLONIES/mL ESCHERICHIA COLI (A)  Final   Report Status PENDING  Incomplete  C Difficile Quick Screen w PCR reflex     Status: None   Collection Time: 04/30/24 10:05 PM   Specimen: STOOL  Result Value Ref Range Status   C Diff antigen NEGATIVE NEGATIVE Final   C Diff toxin NEGATIVE NEGATIVE Final   C Diff interpretation No C. difficile detected.  Final    Comment: Performed at [facility], [address] [place], [place] [address]  Blood culture (routine x 2)     Status: None (Preliminary result)   Collection Time: 05/01/24  4:00 AM   Specimen: BLOOD  Result Value Ref Range Status   Specimen Description   Final    BLOOD BLOOD RIGHT ARM Performed at [facility], [address], [place], [place] [address]     Special Requests   Final    BOTTLES DRAWN AEROBIC ONLY Blood Culture results may not be optimal due to an inadequate volume of blood received in culture bottles Performed at [facility], [address] [place], [place] [address]    Culture   Final    NO GROWTH < 24 HOURS Performed at [facility], [address], [place], [place] [address]    Report Status PENDING  Incomplete  Blood culture (routine x 2)     Status: None (Preliminary result)   Collection Time: 05/01/24  4:04 AM   Specimen: BLOOD  Result Value Ref Range Status   Specimen Description   Final    BLOOD LEFT ANTECUBITAL Performed at [facility], [address], [place], [place] [address]    Special Requests   Final    BOTTLES DRAWN AEROBIC AND ANAEROBIC Blood Culture results may not be optimal due to an inadequate volume of blood received in culture bottles Performed at [facility], [address], [place], [place] [address]    Culture   Final    NO GROWTH < 24 HOURS Performed at [facility], [address], [place], [place] [address]    Report Status PENDING  Incomplete      Radiology Studies: ECHOCARDIOGRAM COMPLETE Result Date: 05/01/2024    ECHOCARDIOGRAM REPORT   Patient Name:   Sonya Solomon Date of Exam: 05/01/2024 Medical Rec #:  [id number]       Height:       64.5 in Accession #:    [id number]      Weight:       197.5 lb Date of Birth:  01/21/1946        BSA:          1.956 m Patient Age:    78 years        BP:           97/69 mmHg Patient Gender: F               HR:  73 bpm. Exam Location:  Inpatient Procedure: 2D Echo, Cardiac Doppler, Color Doppler and Intracardiac            Opacification Agent (Both Spectral and Color Flow Doppler were            utilized during procedure). Indications:    Elevated troponin  History:        Patient has no prior history of Echocardiogram examinations.                 Risk Factors:Hypertension  and Diabetes.  Sonographer:    Merlynn Manas Referring Phys: [id number] TERRY LOISE HURST  Sonographer Comments: Image acquisition challenging due to respiratory motion. IMPRESSIONS  1. Significant LV hypertrophy, especially apically. 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 CMR may be helpful for further evaluation. Left ventricular ejection fraction, by estimation, is 70 to 75%. The left ventricle has hyperdynamic function. The left ventricle has no regional wall motion abnormalities. There is moderate concentric left ventricular hypertrophy. Left ventricular diastolic parameters are consistent with Grade I diastolic dysfunction (impaired relaxation).  2. Right ventricular systolic function is normal. The right ventricular size is normal. Mildly increased right ventricular wall thickness. There is severely elevated pulmonary artery systolic pressure. The estimated right ventricular systolic pressure is 68.0 mmHg.  3. Left atrial size was mildly dilated.  4. The mitral valve is normal in structure. Mild mitral valve regurgitation. No evidence of mitral stenosis.  5. The aortic valve is normal in structure. Aortic valve regurgitation is not visualized. Mild aortic valve stenosis. Aortic valve Vmax measures 2.24 m/s.  6. The inferior vena cava is normal in size with greater than 50% respiratory variability, suggesting right atrial pressure of 3 mmHg. FINDINGS  Left Ventricle: Significant LV hypertrophy, especially apically. 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 CMR may be helpful for further evaluation. Left ventricular ejection fraction, by estimation, is 70 to 75%. The left ventricle has hyperdynamic function. The left ventricle has no regional wall motion abnormalities. Definity  contrast agent was given IV to delineate the left ventricular endocardial borders. The left ventricular internal cavity size was normal in size. There is moderate concentric left ventricular hypertrophy. Left ventricular  diastolic parameters are consistent with Grade I diastolic dysfunction (impaired relaxation). Right Ventricle: The right ventricular size is normal. Mildly increased right ventricular wall thickness. Right ventricular systolic function is normal. There is severely elevated pulmonary artery systolic pressure. The tricuspid regurgitant velocity is 4.03 m/s, and with an assumed right atrial pressure of 3 mmHg, the estimated right ventricular systolic pressure is 68.0 mmHg. Left Atrium: Left atrial size was mildly dilated. Right Atrium: Right atrial size was normal in size. Pericardium: There is no evidence of pericardial effusion. Mitral Valve: The mitral valve is normal in structure. Mild mitral valve regurgitation. No evidence of mitral valve stenosis. Tricuspid Valve: The tricuspid valve is normal in structure. Tricuspid valve regurgitation is mild . No evidence of tricuspid stenosis. Aortic Valve: The aortic valve is normal in structure. Aortic valve regurgitation is not visualized. Mild aortic stenosis is present. Aortic valve mean gradient measures 10.0 mmHg. Aortic valve peak gradient measures 20.1 mmHg. Aortic valve area, by VTI measures 2.32 cm. Pulmonic Valve: The pulmonic valve was normal in structure. Pulmonic valve regurgitation is not visualized. No evidence of pulmonic stenosis. Aorta: The aortic root is normal in size and structure. Venous: The inferior vena cava is normal in size with greater than 50% respiratory variability, suggesting right atrial pressure of  3 mmHg. IAS/Shunts: No atrial level shunt detected by color flow Doppler.  LEFT VENTRICLE PLAX 2D LVIDd:         5.00 cm   Diastology LVIDs:         2.90 cm   LV e' medial:    4.90 cm/s LV PW:         0.90 cm   LV E/e' medial:  11.9 LV IVS:        0.90 cm   LV e' lateral:   7.18 cm/s LVOT diam:     2.00 cm   LV E/e' lateral: 8.1 LV SV:         99 LV SV Index:   50 LVOT Area:     3.14 cm  RIGHT VENTRICLE             IVC RV Basal diam:  3.70 cm      IVC diam: 1.60 cm RV S prime:     18.60 cm/s TAPSE (M-mode): 2.5 cm      PULMONARY VEINS                             Diastolic Velocity: 51.40 cm/s                             S/D Velocity:       1.40                             Systolic Velocity:  71.10 cm/s LEFT ATRIUM             Index        RIGHT ATRIUM           Index LA diam:        3.90 cm 1.99 cm/m   RA Area:     15.60 cm LA Vol (A2C):   52.0 ml 26.58 ml/m  RA Volume:   42.70 ml  21.82 ml/m LA Vol (A4C):   62.2 ml 31.79 ml/m LA Biplane Vol: 59.4 ml 30.36 ml/m  AORTIC VALVE AV Area (Vmax):    2.64 cm AV Area (Vmean):   2.95 cm AV Area (VTI):     2.32 cm AV Vmax:           224.00 cm/s AV Vmean:          148.000 cm/s AV VTI:            0.425 m AV Peak Grad:      20.1 mmHg AV Mean Grad:      10.0 mmHg LVOT Vmax:         188.00 cm/s LVOT Vmean:        139.000 cm/s LVOT VTI:          0.314 m LVOT/AV VTI ratio: 0.74  AORTA Ao Root diam: 3.50 cm Ao Asc diam:  3.80 cm MITRAL VALVE               TRICUSPID VALVE MV Area (PHT): 3.14 cm    TR Peak grad:   65.0 mmHg MV E velocity: 58.10 cm/s  TR Vmax:        403.00 cm/s MV A velocity: 85.70 cm/s MV E/A ratio:  0.68        SHUNTS  Systemic VTI:  0.31 m                            Systemic Diam: 2.00 cm Morene Brownie Electronically signed by Morene Brownie Signature Date/Time: 05/01/2024/1:42:17 PM    Final    CT ABDOMEN PELVIS WO CONTRAST Result Date: 04/30/2024 EXAM: CT ABDOMEN AND PELVIS WITHOUT CONTRAST 04/30/2024 09:16:11 PM TECHNIQUE: CT of the abdomen and pelvis was performed without the administration of intravenous contrast. Multiplanar reformatted images are provided for review. Automated exposure control, iterative reconstruction, and/or weight-based adjustment of the mA/kV was utilized to reduce the radiation dose to as low as reasonably achievable. COMPARISON: None available. CLINICAL HISTORY: Bowel obstruction suspected; Sepsis. 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 LIVER: The liver is unremarkable. GALLBLADDER AND BILE DUCTS: Gallbladder is not visualized. No biliary ductal dilatation. SPLEEN: No acute abnormality. PANCREAS: No acute abnormality. ADRENAL GLANDS: There is an intramural left adrenal nodule measuring 1.9 x 1.4 cm. KIDNEYS, URETERS AND BLADDER: No stones in the kidneys or ureters. No hydronephrosis. No perinephric or periureteral stranding. Urinary bladder is unremarkable. GI AND BOWEL: Stomach demonstrates no acute abnormality. Sigmoid colon anastomosis is present. There is sigmoid and descending colon diverticulosis. There is circumferential wall thickening of the entire colon more significant proximally. There is mild inflammatory stranding surrounding the ascending colon. The appendix was not definitively visualized. There is no bowel obstruction. PERITONEUM AND RETROPERITONEUM: No ascites. No free air. VASCULATURE: Aorta is normal in caliber. There are atherosclerotic calcifications of the aorta and iliac arteries. LYMPH NODES: No lymphadenopathy. REPRODUCTIVE ORGANS: Uterus is surgically absent. BONES AND SOFT TISSUES: No acute osseous abnormality. No focal soft tissue abnormality. IMPRESSION: 1. Pancolitis, greatest proximally. 2. Sigmoid and descending colon diverticulosis without diverticulitis. 3. Indeterminate 1.9 cm left adrenal nodule; recommend 1-year follow-up adrenal washout CT. 4. Partially imaged intraatrial lipoma. Electronically signed by: Greig Pique MD 04/30/2024 10:06 PM EST RP Workstation: [id number]   DG Chest Port 1 View Result Date: 04/30/2024 EXAM: 1 VIEW(S) XRAY OF THE CHEST 04/30/2024 08:28:32 PM COMPARISON: None available. CLINICAL HISTORY: weakness, fall FINDINGS: LUNGS AND PLEURA: No focal pulmonary opacity. No pleural effusion. No pneumothorax. HEART AND MEDIASTINUM: Aortic arch calcifications. BONES AND SOFT TISSUES: Multilevel thoracic osteophytosis. No acute osseous  abnormality. IMPRESSION: 1. No acute cardiopulmonary abnormality. Electronically signed by: Morgane Naveau MD 04/30/2024 09:12 PM EST RP Workstation: [id number]      Scheduled Meds:  Chlorhexidine  Gluconate Cloth  6 each Topical Daily   feeding supplement  237 mL Oral BID BM   heparin   5,000 Units Subcutaneous Q8H   insulin  aspart  0-5 Units Subcutaneous QHS   insulin  aspart  0-6 Units Subcutaneous TID WC   levETIRAcetam   500 mg Oral QHS   midodrine   10 mg Oral TID WC   pantoprazole   40 mg Oral Daily   simvastatin   40 mg Oral q1800   Continuous Infusions:  sodium chloride  125 mL/hr at 05/02/24 0800   cefTRIAXone  (ROCEPHIN )  IV Stopped (05/01/24 2329)     LOS: 2 days   Time spent: 25 minutes   Delon Hoe, DO [facility] 05/02/2024, 12:11 PM   Available via Epic secure chat 7am-7pm After these hours, please refer to coverage provider listed on [email or web address]  "

## 2024-05-03 ENCOUNTER — Inpatient Hospital Stay (HOSPITAL_COMMUNITY)

## 2024-05-03 DIAGNOSIS — N179 Acute kidney failure, unspecified: Secondary | ICD-10-CM | POA: Diagnosis not present

## 2024-05-03 LAB — URINE CULTURE: Culture: >=100000 — AB

## 2024-05-03 LAB — GLUCOSE, CAPILLARY
Glucose-Capillary: 166 mg/dL — ABNORMAL HIGH (ref 70–99)
Glucose-Capillary: 191 mg/dL — ABNORMAL HIGH (ref 70–99)
Glucose-Capillary: 199 mg/dL — ABNORMAL HIGH (ref 70–99)
Glucose-Capillary: 289 mg/dL — ABNORMAL HIGH (ref 70–99)

## 2024-05-03 LAB — BASIC METABOLIC PANEL WITH GFR
Anion gap: 10 (ref 5–15)
BUN: 73 mg/dL — ABNORMAL HIGH (ref 8–23)
CO2: 14 mmol/L — ABNORMAL LOW (ref 22–32)
Calcium: 9.3 mg/dL (ref 8.9–10.3)
Chloride: 112 mmol/L — ABNORMAL HIGH (ref 98–111)
Creatinine, Ser: 4.29 mg/dL — ABNORMAL HIGH (ref 0.44–1.00)
GFR, Estimated: 10 mL/min — ABNORMAL LOW
Glucose, Bld: 190 mg/dL — ABNORMAL HIGH (ref 70–99)
Potassium: 4.6 mmol/L (ref 3.5–5.1)
Sodium: 136 mmol/L (ref 135–145)

## 2024-05-03 NOTE — Evaluation (Signed)
 Physical Therapy Evaluation Patient Details Name: CLELLA MCKEEL MRN: [id number] DOB: 1946-02-03 Today's Date: 05/03/2024  History of Present Illness  78 y.o. female who presents to the ER 04/30/24 due to generalized weakness, dizziness, and a fall, hypotension, AKI on CKD  Prior medical history significant for MS, type 2 diabetes, hyperlipidemia, hypertension, obesity, history of gait disturbance, vertigo, urinary urgency, spasticity, insomnia  Clinical Impression  Pt admitted with above diagnosis.  Pt currently with functional limitations due to the deficits listed below (see PT Problem List). Pt will benefit from acute skilled PT to increase their independence and safety with mobility to allow discharge.     The patient is labile, having some expressive issues. Spouse at bedside.  Patient presents with significant RLE weakness that she has been followed by neurology.  Patient  reports ambulatory in home  with RW and able to negotiate steps, independent in ADL's, does not drive.  Patient  required max assist to sitting, then stood with mod support in STEDY lift equipment to transfer safely to recliner with RLE weakness and decreased sensation.  Patient will benefit from intensive inpatient follow-up therapy, >3 hours/day Patient tolerated well and is hopeful to return to ambulatory status.       If plan is discharge home, recommend the following: A lot of help with walking and/or transfers;A lot of help with bathing/dressing/bathroom;Direct supervision/assist for medications management;Assist for transportation;Assistance with cooking/housework;Help with stairs or ramp for entrance   Can travel by private vehicle        Equipment Recommendations None recommended by PT  Recommendations for Other Services  Rehab consult    Functional Status Assessment Patient has had a recent decline in their functional status and demonstrates the ability to make significant improvements in function in  a reasonable and predictable amount of time.     Precautions / Restrictions Precautions Precautions: Fall Precaution/Restrictions Comments: right LE very weak: per pt dragged RLE PTA but amb. w/ RW Restrictions Weight Bearing Restrictions Per Provider Order: No      Mobility  Bed Mobility Overal bed mobility: Needs Assistance Bed Mobility: Supine to Sit     Supine to sit: Max assist, +2 for physical assistance, +2 for safety/equipment, HOB elevated     General bed mobility comments: asssit moving th right  leg, patient does assist with the left leg. , required max assist to move to sitting upright    Transfers Overall transfer level: Needs assistance   Transfers: Sit to/from Stand Sit to Stand: Mod assist           General transfer comment: cues for hand placement, patient able to stand at stedy, trnasfer  seated on stedy to recliner, able to stand again. Transfer via Lift Equipment: Stedy  Ambulation/Gait                  Stairs            Wheelchair Mobility     Tilt Bed    Modified Rankin (Stroke Patients Only)       Balance Overall balance assessment: Needs assistance, History of Falls Sitting-balance support: No upper extremity supported, Feet supported Sitting balance-Leahy Scale: Fair     Standing balance support: Reliant on assistive device for balance, During functional activity, Bilateral upper extremity supported Standing balance-Leahy Scale: Poor Standing balance comment: stands  in stedty with right knee guarded  Pertinent Vitals/Pain Pain Assessment Pain Assessment: No/denies pain    Home Living Family/patient expects to be discharged to:: Private residence Living Arrangements: Spouse/significant other Available Help at Discharge: Family Type of Home: House Home Access: Stairs to enter Entrance Stairs-Rails: Left Entrance Stairs-Number of Steps: 4   Home Layout: One  level Home Equipment: Cane - single Librarian, Academic (2 wheels);Toilet riser;Grab bars - tub/shower      Prior Function Prior Level of Function : Independent/Modified Independent             Mobility Comments: ambulated with RW, stated dragged right leg ADLs Comments: reports was independnet     Extremity/Trunk Assessment   Upper Extremity Assessment Upper Extremity Assessment: Overall WFL for tasks assessed    Lower Extremity Assessment Lower Extremity Assessment: RLE deficits/detail RLE Deficits / Details: trace toe movement, 0 dorsi/plantar flex, 1+ knee ext, 1 hip flex RLE Sensation: decreased light touch;decreased proprioception    Cervical / Trunk Assessment Cervical / Trunk Assessment: Normal  Communication   Communication Communication: No apparent difficulties;Impaired Factors Affecting Communication: Difficulty expressing self    Cognition Arousal: Alert Behavior During Therapy: Anxious   PT - Cognitive impairments: Orientation, Awareness   Orientation impairments: Time                   PT - Cognition Comments: oriented to day Following commands: Intact       Cueing Cueing Techniques: Verbal cues     General Comments      Exercises     Assessment/Plan    PT Assessment Patient needs continued PT services  PT Problem List Decreased strength;Decreased range of motion;Decreased knowledge of use of DME;Decreased safety awareness;Decreased activity tolerance;Decreased balance;Decreased mobility;Impaired sensation;Decreased coordination;Decreased cognition       PT Treatment Interventions DME instruction;Gait training;Functional mobility training;Therapeutic activities;Therapeutic exercise;Patient/family education;Cognitive remediation    PT Goals (Current goals can be found in the Care Plan section)  Acute Rehab PT Goals Patient Stated Goal: to walk again PT Goal Formulation: With patient/family Time For Goal Achievement:  05/17/24 Potential to Achieve Goals: Fair    Frequency Min 3X/week     Co-evaluation               AM-PAC PT 6 Clicks Mobility  Outcome Measure Help needed turning from your back to your side while in a flat bed without using bedrails?: A Lot Help needed moving from lying on your back to sitting on the side of a flat bed without using bedrails?: A Lot Help needed moving to and from a bed to a chair (including a wheelchair)?: Total Help needed standing up from a chair using your arms (e.g., wheelchair or bedside chair)?: Total Help needed to walk in hospital room?: Total Help needed climbing 3-5 steps with a railing? : Total 6 Click Score: 8    End of Session Equipment Utilized During Treatment: Gait belt Activity Tolerance: Patient tolerated treatment well Patient left: in chair;with call bell/phone within reach;with chair alarm set Nurse Communication: Mobility status;Need for lift equipment PT Visit Diagnosis: Unsteadiness on feet (R26.81);Other abnormalities of gait and mobility (R26.89);Repeated falls (R29.6);Muscle weakness (generalized) (M62.81);History of falling (Z91.81);Difficulty in walking, not elsewhere classified (R26.2);Other symptoms and signs involving the nervous system (R29.898)    Time: 1530-1605 PT Time Calculation (min) (ACUTE ONLY): 35 min   Charges:   PT Evaluation $PT Eval Low Complexity: 1 Low PT Treatments $Therapeutic Activity: 8-22 mins PT General Charges $$ ACUTE PT VISIT: 1 Visit  Darice Potters PT Acute Rehabilitation Services Office [phone]   Potters Darice Norris 05/03/2024, 4:26 PM

## 2024-05-03 NOTE — TOC Initial Note (Signed)
 Transition of Care [facility]) - Initial/Assessment Note    Patient Details  Name: Sonya Solomon MRN: [id number] Date of Birth: Jan 05, 1946  [facility] CM/SW Contact:    Sonya ONEIDA Anon, RN Phone Number: 05/03/2024, 4:17 PM  Clinical Narrative:                 Pt is from home with spouse. Pt states she has a walker at home but it is too big and believes that because of this it helped play a role in her falling. States she would prefer a standard 2 wheeled RW like the one in her hospital room. Pt states she was not receiving any HH services PTA. States her husband does not drive and may need help with transportation at discharge. PT/OT consulted, awaiting new recommendations. ICM will continue to follow for DC planning needs.    Expected Discharge Plan:  (TBD) Barriers to Discharge: Continued Medical Work up   Patient Goals and CMS Choice Patient states their goals for this hospitalization and ongoing recovery are:: Return home with spouse CMS Medicare.gov Compare Post Acute Care list provided to:: Patient Choice offered to / list presented to : [name] ownership interest in [facility].provided to:: Patient    Expected Discharge Plan and Services In-house Referral: NA Discharge Planning Services: CM Consult Post Acute Care Choice: Durable Medical Equipment Living arrangements for the past 2 months: Single Family Home                                      Prior Living Arrangements/Services Living arrangements for the past 2 months: Single Family Home Lives with:: Spouse Patient language and need for interpreter reviewed:: Yes Do you feel safe going back to the place where you live?: Yes      Need for Family Participation in Patient Care: Yes (Comment) Care giver support system in place?: Yes (comment) Current home services: DME Criminal Activity/Legal Involvement Pertinent to Current Situation/Hospitalization: No - Comment as  needed  Activities of Daily Living   ADL Screening (condition at time of admission) Independently performs ADLs?: No Does the patient have a NEW difficulty with bathing/dressing/toileting/self-feeding that is expected to last >3 days?: No Does the patient have a NEW difficulty with getting in/out of bed, walking, or climbing stairs that is expected to last >3 days?: No Does the patient have a NEW difficulty with communication that is expected to last >3 days?: No Is the patient deaf or have difficulty hearing?: No Does the patient have difficulty seeing, even when wearing glasses/contacts?: No Does the patient have difficulty concentrating, remembering, or making decisions?: No  Permission Sought/Granted Permission sought to share information with : Family Supports Permission granted to share information with : Yes, Verbal Permission Granted  Share Information with NAME: Sonya Solomon  Spouse, Emergency Contact  [phone]           Emotional Assessment Appearance:: Appears stated age Attitude/Demeanor/Rapport: Engaged Affect (typically observed): Appropriate Orientation: : Oriented to Self, Oriented to Place, Oriented to  Time Alcohol / Substance Use: Not Applicable Psych Involvement: No (comment)  Admission diagnosis:  Hyperkalemia [E87.5] Pancolitis [K52.9] Decreased oral intake [R63.8] AKI (acute kidney injury) [N17.9] Fall in home, initial encounter [W19.XXXA, Y92.009] Urinary tract infection without hematuria, site unspecified [N39.0] Acute renal failure, unspecified acute renal failure type [N17.9] Diarrhea, unspecified type [R19.7] Patient Active Problem List   Diagnosis Date  Noted   CKD stage 3a, GFR 45-59 ml/min (HCC) 05/01/2024   Hyperkalemia 05/01/2024   Demand ischemia (HCC) 05/01/2024   Pyuria 05/01/2024   Diarrhea 05/01/2024   Seizure (HCC) 05/01/2024   HTN (hypertension) 05/01/2024   AKI (acute kidney injury) 04/30/2024   Irritable bowel syndrome with  diarrhea 01/12/2022   Senile purpura 08/29/2020   Vertigo 03/26/2020   Diplopia 03/26/2020   Gait disturbance 03/26/2020   Urinary urgency 03/26/2020   MS (multiple sclerosis) 12/13/2019   Hyperlipidemia associated with type 2 diabetes mellitus (HCC) 12/13/2019   Type 2 diabetes mellitus with other specified complication (HCC) 12/13/2019   Abnormal electrocardiogram 03/09/2011   PCP:  Sonya Cole, MD Pharmacy:   [facility] [phone] - [place], [place] - [address] AT [facility] OF [address] [place] [address] Phone: [phone] Fax: [phone]     Social Drivers of Health (SDOH) Social History: SDOH Screenings   Food Insecurity: No Food Insecurity (05/01/2024)  Housing: Low Risk (05/01/2024)  Transportation Needs: No Transportation Needs (05/01/2024)  Utilities: Not At Risk (05/01/2024)  Social Connections: Moderately Integrated (05/01/2024)  Tobacco Use: Low Risk (04/30/2024)   SDOH Interventions:     Readmission Risk Interventions    05/03/2024    4:12 PM  Readmission Risk Prevention Plan  Transportation Screening Complete  PCP or Specialist Appt within 5-7 Days Complete  Home Care Screening Complete  Medication Review (RN CM) Complete

## 2024-05-03 NOTE — Progress Notes (Signed)
   Inpatient Rehab Admissions Coordinator :  Per therapy recommendations patient was screened for CIR candidacy by Ottie Glazier RN MSN. Patient is not yet at a level to tolerate the intensity required to pursue a CIR admit . The CIR admissions team will follow and monitor for progress and place a Rehab Consult order if felt to be appropriate. Please contact me with any questions.  Ottie Glazier RN MSN Admissions Coordinator [phone]

## 2024-05-03 NOTE — Progress Notes (Signed)
 " PROGRESS NOTE    Sonya Solomon  FMW:[id number] DOB: [birth year] DOA: 04/30/2024 PCP: Leonel Cole, MD  Brief Narrative: 78 y.o. female with medical history significant for MS, type 2 diabetes, hyperlipidemia, hypertension, obesity, history of gait disturbance, vertigo, urinary urgency, spasticity, insomnia, who presents to the ER due to generalized weakness, dizziness, and a fall.  She was on the floor for about 4 hours before the husband could get help and get her off the floor.  She has been having diarrhea for few days prior to admission.  In the ED her systolic blood pressure was in the 70s diastolic in the 40s.  She was found to have an AKI on CKD stage III AA with a creatinine of 5.4 and admitted for the same.  At baseline she walks with a walker and a cane independent with all ADLs but does not cook or clean the house.  She lives at home with her husband.   Assessment & Plan:   Principal Problem:   AKI (acute kidney injury) Active Problems:   MS (multiple sclerosis)   Hyperlipidemia associated with type 2 diabetes mellitus (HCC)   CKD stage 3a, GFR 45-59 ml/min (HCC)   Hyperkalemia   Demand ischemia (HCC)   Pyuria   Diarrhea   Seizure (HCC)   HTN (hypertension)    AKI on CKD stage IIIa baseline creatinine is around 1.1.  She was found to have a creatinine of 5.4 on admission due to dehydration poor p.o. intake and diarrhea and hypotension.  She was given IV fluid boluses and admitted.  Creatinine is improving but slow 4.2 from 5.4 on admission.  Foley placed with an I and O of 2008/ 200?? Check a renal ultrasound.  Hypotension still soft 109/51 continue IV fluid midodrine  and Solu-Cortef    E. coli UTI, POA  - Urine culture showing E. coli, susceptibility pending - Rocephin     Hyperkalemia - Resolved   Demand ischemia - Troponin trend relatively flat 126, 141, 169 - Echocardiogram negative for regional wall motion abnormality, EF 70 to 75%, grade 1 diastolic dysfunction,  elevated pulmonary artery systolic pressure, LV hypertrophy   Diarrhea - C. difficile negative - GI PCR panel ordered but has not had a bowel movement since admission.    Seizure disorder - Keppra , dose adjusted for renal function   MS - PT OT   Hypertension - Lisinopril  on hold due to AKI   Diabetes mellitus - A1c 7.0 - Sliding scale insulin    Hyperlipidemia - Zocor     Estimated body mass index is 33.38 kg/m as calculated from the following:   Height as of this encounter: 5' 4.5 (1.638 m).   Weight as of this encounter: 89.6 kg.  DVT prophylaxis: Heparin   code Status: Full code  family Communication: None at bedside Disposition Plan:  Status is: Inpatient Remains inpatient appropriate because: Persistent AKI   Consultants:  None  Procedures: None Antimicrobials:-Rocephin   Subjective: She is resting in bed she is awake and alert answer all my questions appropriately follows commands did not sleep well lives at home with her husband  Objective: Vitals:   05/03/24 0500 05/03/24 0600 05/03/24 0700 05/03/24 0800  BP: (!) 117/47 (!) 103/44 105/64 (!) 109/51  Pulse: 62 (!) 50 (!) 55 (!) 53  Resp: 16 13 18 17   Temp:    97.6 F (36.4 C)  TempSrc:    Oral  SpO2: 98% 98% 100% 100%  Weight:      Height:  Intake/Output Summary (Last 24 hours) at 05/03/2024 0906 Last data filed at 05/03/2024 0800 Gross per 24 hour  Intake 4016.94 ml  Output 650 ml  Net 3366.94 ml   Filed Weights   04/30/24 2001 04/30/24 2015  Weight: 89.6 kg 89.6 kg    Examination:  General exam: Appears calm and comfortable  Respiratory system: Clear to auscultation. Respiratory effort normal. Cardiovascular system:reg  Gastrointestinal system: Abdomen is nondistended, soft and nontender. No organomegaly or masses felt. Normal bowel sounds heard. Central nervous system: Alert and oriented. Extremities: 1+ bilateral feet edema peers to be chronic    Data Reviewed: I have  personally reviewed following labs and imaging studies  CBC: Recent Labs  Lab 04/30/24 2034 05/01/24 0804  WBC 14.4* 10.2  NEUTROABS 11.9*  --   HGB 12.0 10.6*  HCT 35.9* 32.3*  MCV 90.4 92.0  PLT 258 232   Basic Metabolic Panel: Recent Labs  Lab 04/30/24 0022 04/30/24 2034 05/01/24 0804 05/02/24 0251 05/03/24 0251  NA 130* 130* 132* 133* 136  K 5.7* 6.1* 5.1 4.9 4.6  CL 100 96* 103 107 112*  CO2 19* 18* 15* 14* 14*  GLUCOSE 117* 134* 101* 115* 190*  BUN 79* 79* 69* 74* 73*  CREATININE 5.44* 5.46* 5.40* 5.29* 4.29*  CALCIUM 11.6* 12.5* 10.8* 10.4* 9.3  MG  --  1.9  --   --   --    GFR: Estimated Creatinine Clearance: 12.2 mL/min (A) (by C-G formula based on SCr of 4.29 mg/dL (H)). Liver Function Tests: Recent Labs  Lab 04/30/24 0022 04/30/24 2034  AST 29 35  ALT 16 18  ALKPHOS 35* 38  BILITOT 0.3 0.4  PROT 5.7* 6.3*  ALBUMIN 3.6 4.0   No results for input(s): LIPASE, AMYLASE in the last 168 hours. No results for input(s): AMMONIA in the last 168 hours. Coagulation Profile: No results for input(s): INR, PROTIME in the last 168 hours. Cardiac Enzymes: Recent Labs  Lab 04/30/24 2034  CKTOTAL 201   BNP (last 3 results) No results for input(s): PROBNP in the last 8760 hours. HbA1C: No results for input(s): HGBA1C in the last 72 hours. CBG: Recent Labs  Lab 05/01/24 2202 05/02/24 0752 05/02/24 1227 05/02/24 2204 05/03/24 0751  GLUCAP 120* 129* 188* 208* 166*   Lipid Profile: No results for input(s): CHOL, HDL, LDLCALC, TRIG, CHOLHDL, LDLDIRECT in the last 72 hours. Thyroid Function Tests: No results for input(s): TSH, T4TOTAL, FREET4, T3FREE, THYROIDAB in the last 72 hours. Anemia Panel: No results for input(s): VITAMINB12, FOLATE, FERRITIN, TIBC, IRON, RETICCTPCT in the last 72 hours. Sepsis Labs: No results for input(s): PROCALCITON, LATICACIDVEN in the last 168 hours.  Recent Results (from  the past 240 hours)  Urine Culture     Status: Abnormal (Preliminary result)   Collection Time: 04/30/24  8:24 PM   Specimen: Urine, Random  Result Value Ref Range Status   Specimen Description   Final    URINE, RANDOM Performed at [facility], [address] [place], [place] [address]    Special Requests   Final    NONE Reflexed from [phone] Performed at [facility], [address], [place], [place] [address]    Culture >=100,000 COLONIES/mL ESCHERICHIA COLI (A)  Final   Report Status PENDING  Incomplete  C Difficile Quick Screen w PCR reflex     Status: None   Collection Time: 04/30/24 10:05 PM   Specimen: STOOL  Result Value Ref Range Status  C Diff antigen NEGATIVE NEGATIVE Final   C Diff toxin NEGATIVE NEGATIVE Final   C Diff interpretation No C. difficile detected.  Final    Comment: Performed at [facility], [address] [place], [place] [address]  Blood culture (routine x 2)     Status: None (Preliminary result)   Collection Time: 05/01/24  4:00 AM   Specimen: BLOOD  Result Value Ref Range Status   Specimen Description   Final    BLOOD BLOOD RIGHT ARM Performed at [facility], [address] [place], [place] [address]    Special Requests   Final    BOTTLES DRAWN AEROBIC ONLY Blood Culture results may not be optimal due to an inadequate volume of blood received in culture bottles Performed at [facility], [address], [place], [place] [address]    Culture   Final    NO GROWTH 2 DAYS Performed at [facility], [address], [place], [place] [address]    Report Status PENDING  Incomplete  Blood culture (routine x 2)     Status: None (Preliminary result)   Collection Time: 05/01/24  4:04 AM   Specimen: BLOOD  Result Value Ref Range Status   Specimen Description   Final    BLOOD LEFT ANTECUBITAL Performed at [facility], [address] [place], [place] [address]    Special Requests   Final    BOTTLES DRAWN AEROBIC AND ANAEROBIC Blood Culture results may not be optimal due to an inadequate volume of blood received in culture bottles Performed at [facility], [address] [place], [place] [address]    Culture   Final    NO GROWTH 2 DAYS Performed at [facility], [address], [place], [place] [address]    Report Status PENDING  Incomplete         Radiology Studies: ECHOCARDIOGRAM COMPLETE Result Date: 05/01/2024    ECHOCARDIOGRAM REPORT   Patient Name:   Sonya Solomon Date of Exam: 05/01/2024 Medical Rec #:  [id number]       Height:       64.5 in Accession #:    [id number]      Weight:       197.5 lb Date of Birth:  1945-09-10        BSA:          1.956 m Patient Age:    78 years        BP:           97/69 mmHg Patient Gender: F               HR:           73 bpm. Exam Location:  Inpatient Procedure: 2D Echo, Cardiac Doppler, Color Doppler and Intracardiac            Opacification Agent (Both Spectral and Color Flow Doppler were            utilized during procedure). Indications:    Elevated troponin  History:        Patient has no prior history of Echocardiogram examinations.                 Risk Factors:Hypertension and Diabetes.  Sonographer:    Merlynn Manas Referring Phys: [id number] TERRY LOISE HURST  Sonographer Comments: Image acquisition challenging due to respiratory motion. IMPRESSIONS  1. Significant LV hypertrophy, especially apically. Moderate dynamic  LVOT gradient can be seen in HOCM. CMR may be helpful for further evaluation. Left ventricular ejection fraction, by estimation, is 70 to 75%. The left ventricle has hyperdynamic function. The left ventricle has no regional wall motion abnormalities. There is moderate concentric left ventricular hypertrophy. Left ventricular diastolic parameters are consistent with Grade I diastolic dysfunction (impaired relaxation).  2. Right ventricular  systolic function is normal. The right ventricular size is normal. Mildly increased right ventricular wall thickness. There is severely elevated pulmonary artery systolic pressure. The estimated right ventricular systolic pressure is 68.0 mmHg.  3. Left atrial size was mildly dilated.  4. The mitral valve is normal in structure. Mild mitral valve regurgitation. No evidence of mitral stenosis.  5. The aortic valve is normal in structure. Aortic valve regurgitation is not visualized. Mild aortic valve stenosis. Aortic valve Vmax measures 2.24 m/s.  6. The inferior vena cava is normal in size with greater than 50% respiratory variability, suggesting right atrial pressure of 3 mmHg. FINDINGS  Left Ventricle: Significant LV hypertrophy, especially apically. 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 CMR may be helpful for further evaluation. Left ventricular ejection fraction, by estimation, is 70 to 75%. The left ventricle has hyperdynamic function. The left ventricle has no regional wall motion abnormalities. Definity  contrast agent was given IV to delineate the left ventricular endocardial borders. The left ventricular internal cavity size was normal in size. There is moderate concentric left ventricular hypertrophy. Left ventricular diastolic parameters are consistent with Grade I diastolic dysfunction (impaired relaxation). Right Ventricle: The right ventricular size is normal. Mildly increased right ventricular wall thickness. Right ventricular systolic function is normal. There is severely elevated pulmonary artery systolic pressure. The tricuspid regurgitant velocity is 4.03 m/s, and with an assumed right atrial pressure of 3 mmHg, the estimated right ventricular systolic pressure is 68.0 mmHg. Left Atrium: Left atrial size was mildly dilated. Right Atrium: Right atrial size was normal in size. Pericardium: There is no evidence of pericardial effusion. Mitral Valve: The mitral valve is normal in  structure. Mild mitral valve regurgitation. No evidence of mitral valve stenosis. Tricuspid Valve: The tricuspid valve is normal in structure. Tricuspid valve regurgitation is mild . No evidence of tricuspid stenosis. Aortic Valve: The aortic valve is normal in structure. Aortic valve regurgitation is not visualized. Mild aortic stenosis is present. Aortic valve mean gradient measures 10.0 mmHg. Aortic valve peak gradient measures 20.1 mmHg. Aortic valve area, by VTI measures 2.32 cm. Pulmonic Valve: The pulmonic valve was normal in structure. Pulmonic valve regurgitation is not visualized. No evidence of pulmonic stenosis. Aorta: The aortic root is normal in size and structure. Venous: The inferior vena cava is normal in size with greater than 50% respiratory variability, suggesting right atrial pressure of 3 mmHg. IAS/Shunts: No atrial level shunt detected by color flow Doppler.  LEFT VENTRICLE PLAX 2D LVIDd:         5.00 cm   Diastology LVIDs:         2.90 cm   LV e' medial:    4.90 cm/s LV PW:         0.90 cm   LV E/e' medial:  11.9 LV IVS:        0.90 cm   LV e' lateral:   7.18 cm/s LVOT diam:     2.00 cm   LV E/e' lateral: 8.1 LV SV:         99 LV SV Index:   50 LVOT Area:  3.14 cm  RIGHT VENTRICLE             IVC RV Basal diam:  3.70 cm     IVC diam: 1.60 cm RV S prime:     18.60 cm/s TAPSE (M-mode): 2.5 cm      PULMONARY VEINS                             Diastolic Velocity: 51.40 cm/s                             S/D Velocity:       1.40                             Systolic Velocity:  71.10 cm/s LEFT ATRIUM             Index        RIGHT ATRIUM           Index LA diam:        3.90 cm 1.99 cm/m   RA Area:     15.60 cm LA Vol (A2C):   52.0 ml 26.58 ml/m  RA Volume:   42.70 ml  21.82 ml/m LA Vol (A4C):   62.2 ml 31.79 ml/m LA Biplane Vol: 59.4 ml 30.36 ml/m  AORTIC VALVE AV Area (Vmax):    2.64 cm AV Area (Vmean):   2.95 cm AV Area (VTI):     2.32 cm AV Vmax:           224.00 cm/s AV Vmean:           148.000 cm/s AV VTI:            0.425 m AV Peak Grad:      20.1 mmHg AV Mean Grad:      10.0 mmHg LVOT Vmax:         188.00 cm/s LVOT Vmean:        139.000 cm/s LVOT VTI:          0.314 m LVOT/AV VTI ratio: 0.74  AORTA Ao Root diam: 3.50 cm Ao Asc diam:  3.80 cm MITRAL VALVE               TRICUSPID VALVE MV Area (PHT): 3.14 cm    TR Peak grad:   65.0 mmHg MV E velocity: 58.10 cm/s  TR Vmax:        403.00 cm/s MV A velocity: 85.70 cm/s MV E/A ratio:  0.68        SHUNTS                            Systemic VTI:  0.31 m                            Systemic Diam: 2.00 cm Morene Brownie Electronically signed by Morene Brownie Signature Date/Time: 05/01/2024/1:42:17 PM    Final      Scheduled Meds:  Chlorhexidine  Gluconate Cloth  6 each Topical Daily   feeding supplement  237 mL Oral BID BM   heparin   5,000 Units Subcutaneous Q8H   hydrocortisone  sod succinate (SOLU-CORTEF ) inj  100 mg Intravenous Q8H   insulin  aspart  0-5 Units Subcutaneous QHS   insulin  aspart  0-6 Units Subcutaneous TID  WC   levETIRAcetam   500 mg Oral QHS   midodrine   10 mg Oral TID WC   pantoprazole   40 mg Oral Daily   simvastatin   40 mg Oral q1800   Continuous Infusions:  sodium chloride  125 mL/hr at 05/03/24 0800   cefTRIAXone  (ROCEPHIN )  IV Stopped (05/03/24 0050)     LOS: 3 days    Sonya KANDICE Hoots, MD 05/03/2024, 9:06 AM   "

## 2024-05-04 ENCOUNTER — Inpatient Hospital Stay (HOSPITAL_COMMUNITY)

## 2024-05-04 DIAGNOSIS — N179 Acute kidney failure, unspecified: Secondary | ICD-10-CM | POA: Diagnosis not present

## 2024-05-04 LAB — BASIC METABOLIC PANEL WITH GFR
Anion gap: 10 (ref 5–15)
BUN: 69 mg/dL — ABNORMAL HIGH (ref 8–23)
CO2: 16 mmol/L — ABNORMAL LOW (ref 22–32)
Calcium: 8.6 mg/dL — ABNORMAL LOW (ref 8.9–10.3)
Chloride: 112 mmol/L — ABNORMAL HIGH (ref 98–111)
Creatinine, Ser: 2.87 mg/dL — ABNORMAL HIGH (ref 0.44–1.00)
GFR, Estimated: 16 mL/min — ABNORMAL LOW
Glucose, Bld: 240 mg/dL — ABNORMAL HIGH (ref 70–99)
Potassium: 4.5 mmol/L (ref 3.5–5.1)
Sodium: 138 mmol/L (ref 135–145)

## 2024-05-04 LAB — GLUCOSE, CAPILLARY
Glucose-Capillary: 213 mg/dL — ABNORMAL HIGH (ref 70–99)
Glucose-Capillary: 221 mg/dL — ABNORMAL HIGH (ref 70–99)

## 2024-05-04 LAB — CBC
HCT: 27 % — ABNORMAL LOW (ref 36.0–46.0)
Hemoglobin: 9.2 g/dL — ABNORMAL LOW (ref 12.0–15.0)
MCH: 30.8 pg (ref 26.0–34.0)
MCHC: 34.1 g/dL (ref 30.0–36.0)
MCV: 90.3 fL (ref 80.0–100.0)
Platelets: 203 K/uL (ref 150–400)
RBC: 2.99 MIL/uL — ABNORMAL LOW (ref 3.87–5.11)
RDW: 15.4 % (ref 11.5–15.5)
WBC: 7 K/uL (ref 4.0–10.5)
nRBC: 0 % (ref 0.0–0.2)

## 2024-05-04 MED ORDER — LEVETIRACETAM 500 MG PO TABS
500.0000 mg | ORAL_TABLET | Freq: Two times a day (BID) | ORAL | Status: DC
  Administered 2024-05-04 – 2024-05-07 (×7): 500 mg via ORAL
  Filled 2024-05-04 (×7): qty 1

## 2024-05-04 MED ORDER — HYDROCORTISONE SOD SUC (PF) 100 MG IJ SOLR
50.0000 mg | Freq: Three times a day (TID) | INTRAMUSCULAR | Status: DC
  Administered 2024-05-04 – 2024-05-05 (×3): 50 mg via INTRAVENOUS
  Filled 2024-05-04 (×3): qty 2

## 2024-05-04 NOTE — Evaluation (Signed)
 Occupational Therapy Evaluation Patient Details Name: Sonya Solomon MRN: [id number] DOB: 09-05-1945 Today's Date: 05/04/2024   History of Present Illness   79 yr old female who presented to the ER 04/30/24 due to generalized weakness, dizziness, and a fall, hypotension, AKI on CKD. Prior medical history significant for MS, type 2 diabetes, hyperlipidemia, hypertension, obesity, history of gait disturbance, vertigo, urinary urgency, spasticity, insomnia     Clinical Impressions The pt is currently presenting well below her baseline level of functioning for self care management. She is limited by the below listed deficits. See OT problem list. As such, her ADL performance is compromised and she is requiring increased assist for the management of ADLs. During the session today, she required mod assist to roll left and right in bed, as well as max assist for supine to sit. She required SBA to CGA for sitting balance and total assist to donn her socks seated EOB. She reported a slight fear of falling. She also reported feeling weaker than normal. She will benefit from OT services to maximize her independence with ADLs & to decrease the risk for restricted participation in other meaningful activities. Patient will benefit from intensive inpatient follow-up therapy, >3 hours/day.      If plan is discharge home, recommend the following:   A lot of help with walking and/or transfers;A lot of help with bathing/dressing/bathroom;Help with stairs or ramp for entrance;Assistance with cooking/housework;Assist for transportation     Functional Status Assessment   Patient has had a recent decline in their functional status and demonstrates the ability to make significant improvements in function in a reasonable and predictable amount of time.     Equipment Recommendations   Other (comment) (defer to next setting)     Recommendations for Other Services         Precautions/Restrictions    Precautions Precautions: Fall Restrictions Weight Bearing Restrictions Per Provider Order: No Other Position/Activity Restrictions: baseline R sided weakness     Mobility Bed Mobility Overal bed mobility: Needs Assistance Bed Mobility: Rolling Rolling: Mod assist, Used rails   Supine to sit: HOB elevated, Used rails, Max assist Sit to supine: Mod assist   General bed mobility comments: Pt required mod assist to roll left and right in bed, and assist to advance BLE off the bed as well as for trunk support for supine to sit          Balance       Sitting balance - Comments: SBA to CGA EOB         ADL either performed or assessed with clinical judgement   ADL Overall ADL's : Needs assistance/impaired Eating/Feeding: Set up;Bed level   Grooming: Contact guard assist;Sitting Grooming Details (indicate cue type and reason): simulated seated EOB Upper Body Bathing: Contact guard assist;Sitting   Lower Body Bathing: Maximal assistance;Sitting/lateral leans   Upper Body Dressing : Minimal assistance;Sitting Upper Body Dressing Details (indicate cue type and reason): simulated seated EOB Lower Body Dressing: Total assistance;Sitting/lateral leans Lower Body Dressing Details (indicate cue type and reason): assist needed to donn both socks seated EOB     Toileting- Clothing Manipulation and Hygiene: Maximal assistance;Bed level                            Pertinent Vitals/Pain Pain Assessment Pain Assessment: No/denies pain     Extremity/Trunk Assessment Upper Extremity Assessment Upper Extremity Assessment: Right hand dominant;RUE deficits/detail;LUE deficits/detail RUE Deficits / Details: Required  AAROM for achieve full ROM for shoulder flexion. Elbow and hand AROM WFL. Biceps, triceps and grip strength grossly 4-/5 to 4/5. Baseline RUE weakness from MS. Chronic numbness of her fingers. RUE Sensation: decreased light touch RUE Coordination: decreased fine  motor LUE Deficits / Details: AROM and strength WFL. Gross strength 4/5 to 4+/5   Lower Extremity Assessment Lower Extremity Assessment: RLE deficits/detail;Generalized weakness RLE Deficits / Details: Unable to demo dorsi-flexion, gross weakness, required AAROM for knee flexion and extension, chronic distal LE numbness from MS RLE Coordination: decreased gross motor       Communication Communication Communication: No apparent difficulties;Impaired   Cognition Arousal: Alert Behavior During Therapy: Anxious           Attention impairment (select first level of impairment): Divided attention   OT - Cognition Comments: Oriented x4, tearful about being the hospital and her functional limitations                 Following commands: Intact       Cueing  General Comments   Cueing Techniques: Verbal cues              Home Living Family/patient expects to be discharged to:: Private residence Living Arrangements: Spouse/significant other   Type of Home: Mobile home Home Access: Stairs to enter Entrance Stairs-Number of Steps: 4 Entrance Stairs-Rails: Left Home Layout: One level     Bathroom Shower/Tub: Walk-in shower         Home Equipment: Grab bars - tub/shower;Rollator (4 wheels);Rolling Walker (2 wheels)          Prior Functioning/Environment Prior Level of Function : Independent/Modified Independent             Mobility Comments: She used a RW for household ambulation. ADLs Comments: She reported being modified independent to independent with dressing and toileting, requiring increased time for dressing. Her spouse has been assisting with bathing and managing the cooking and cleaning. She does not drive.    OT Problem List: Decreased strength;Decreased range of motion;Impaired balance (sitting and/or standing);Decreased coordination;Decreased knowledge of use of DME or AE;Impaired sensation   OT Treatment/Interventions: Self-care/ADL  training;Therapeutic exercise;Neuromuscular education;Energy conservation;DME and/or AE instruction;Therapeutic activities;Balance training;Patient/family education      OT Goals(Current goals can be found in the care plan section)   Acute Rehab OT Goals Patient Stated Goal: to get stronger and to return home OT Goal Formulation: With patient Time For Goal Achievement: 05/18/24 Potential to Achieve Goals: Good ADL Goals Pt Will Perform Grooming: with set-up;sitting Pt Will Perform Lower Body Dressing: with contact guard assist;sit to/from stand;sitting/lateral leans;with adaptive equipment Pt Will Transfer to Toilet: with contact guard assist;stand pivot transfer;bedside commode Pt Will Perform Toileting - Clothing Manipulation and hygiene: with contact guard assist;sit to/from stand Additional ADL Goal #1: Pt will perform bed mobility with CGA, in prep for progressive ADL participation.   OT Frequency:  Min 2X/week       AM-PAC OT 6 Clicks Daily Activity     Outcome Measure Help from another person eating meals?: A Little Help from another person taking care of personal grooming?: A Little Help from another person toileting, which includes using toliet, bedpan, or urinal?: A Lot Help from another person bathing (including washing, rinsing, drying)?: A Lot Help from another person to put on and taking off regular upper body clothing?: A Little Help from another person to put on and taking off regular lower body clothing?: Total 6 Click Score: 14   End  of Session Equipment Utilized During Treatment: Other (comment) (N/A) Nurse Communication: Mobility status  Activity Tolerance: Other (comment) (Fair+ tolerance) Patient left: in bed;with call bell/phone within reach;with bed alarm set  OT Visit Diagnosis: Other abnormalities of gait and mobility (R26.89);Muscle weakness (generalized) (M62.81)                Time: 8454-8386 OT Time Calculation (min): 28 min Charges:  OT  General Charges $OT Visit: 1 Visit OT Evaluation $OT Eval Moderate Complexity: 1 Mod OT Treatments $Therapeutic Activity: 8-22 mins  [name], OTR/L 05/04/2024, 5:40 PM

## 2024-05-04 NOTE — Plan of Care (Signed)
   Problem: Coping: Goal: Ability to adjust to condition or change in health will improve Outcome: Progressing   Problem: Health Behavior/Discharge Planning: Goal: Ability to manage health-related needs will improve Outcome: Progressing   Problem: Metabolic: Goal: Ability to maintain appropriate glucose levels will improve Outcome: Progressing   Problem: Nutritional: Goal: Maintenance of adequate nutrition will improve Outcome: Progressing

## 2024-05-04 NOTE — Progress Notes (Signed)
 " PROGRESS NOTE    Sonya Solomon  FMW:[id number] DOB: 04/13/1946 DOA: 04/30/2024 PCP: Leonel Cole, MD  Brief Narrative: 79 y.o. female with medical history significant for MS, type 2 diabetes, hyperlipidemia, hypertension, obesity, history of gait disturbance, vertigo, urinary urgency, spasticity, insomnia, who presents to the ER due to generalized weakness, dizziness, and a fall.  She was on the floor for about 4 hours before the husband could get help and get her off the floor.  She has been having diarrhea for few days prior to admission.  In the ED her systolic blood pressure was in the 70s diastolic in the 40s.  She was found to have an AKI on CKD stage III AA with a creatinine of 5.4 and admitted for the same.  At baseline she walks with a walker and a cane independent with all ADLs but does not cook or clean the house.  She lives at home with her husband.   Assessment & Plan:   Principal Problem:   AKI (acute kidney injury) Active Problems:   MS (multiple sclerosis)   Hyperlipidemia associated with type 2 diabetes mellitus (HCC)   CKD stage 3a, GFR 45-59 ml/min (HCC)   Hyperkalemia   Demand ischemia (HCC)   Pyuria   Diarrhea   Seizure (HCC)   HTN (hypertension)    AKI on CKD stage IIIa baseline creatinine is around 1.1.  She was found to have a creatinine of 5.4 on admission due to dehydration poor p.o. intake and diarrhea and hypotension.  She was given IV fluid boluses and admitted.  Creatinine is improving but slow 2.87 from 4.2 from 5.4 on admission.  Renal ultrasound shows some evidence of medical renal disease with no obstruction.  Foley in place.  Hypotension blood pressure 142/66 improving on midodrine  and Solu-Cortef  and IV fluids.  Will taper Solu-Cortef .   E. coli UTI, POA  - Urine culture showing E. coli, sensitive to Rocephin . - Rocephin     Hyperkalemia - Resolved   Demand ischemia - Troponin trend relatively flat 126, 141, 169 - Echocardiogram negative for  regional wall motion abnormality, EF 70 to 75%, grade 1 diastolic dysfunction, elevated pulmonary artery systolic pressure, LV hypertrophy   Diarrhea - C. difficile negative - GI PCR panel ordered but has not had a bowel movement since admission.    Seizure disorder - Keppra , dose adjusted for renal function   MS - PT OT   Hypertension - Lisinopril  on hold due to AKI   Diabetes mellitus - A1c 7.0 - Sliding scale insulin    Hyperlipidemia - Zocor     Estimated body mass index is 33.38 kg/m as calculated from the following:   Height as of this encounter: 5' 4.5 (1.638 m).   Weight as of this encounter: 89.6 kg.  DVT prophylaxis: Heparin   code Status: Full code  family Communication: with patient's husband on the phone  disposition Plan:  Status is: Inpatient Remains inpatient appropriate because: Persistent AKI   Consultants:  None  Procedures: None Antimicrobials:-Rocephin   Subjective: Patient complaining of some shortness of breath which is new has been on IV fluids since admission.  Will hold IV fluids today check a chest x-ray creatinine improving Objective: Vitals:   05/04/24 0500 05/04/24 0600 05/04/24 0700 05/04/24 0800  BP: (!) 110/51 (!) 103/46 (!) 114/57 (!) 142/66  Pulse: (!) 53 (!) 47 (!) 57 (!) 55  Resp: 15 16 17  (!) 21  Temp:    97.8 F (36.6 C)  TempSrc:  Oral  SpO2: 95% 96% 97% 99%  Weight:      Height:        Intake/Output Summary (Last 24 hours) at 05/04/2024 0934 Last data filed at 05/04/2024 0800 Gross per 24 hour  Intake 3073.69 ml  Output 1200 ml  Net 1873.69 ml   Filed Weights   04/30/24 2001 04/30/24 2015  Weight: 89.6 kg 89.6 kg    Examination:  General exam: Appears calm and comfortable  Respiratory system: Clear to auscultation. Respiratory effort normal.  Diminished breath sounds Cardiovascular system:reg  Gastrointestinal system: Abdomen is nondistended, soft and nontender. No organomegaly or masses felt. Normal bowel  sounds heard. Central nervous system: Alert and oriented. Extremities: 1+ bilateral feet edema peers to be chronic    Data Reviewed: I have personally reviewed following labs and imaging studies  CBC: Recent Labs  Lab 04/30/24 2034 05/01/24 0804 05/04/24 0626  WBC 14.4* 10.2 7.0  NEUTROABS 11.9*  --   --   HGB 12.0 10.6* 9.2*  HCT 35.9* 32.3* 27.0*  MCV 90.4 92.0 90.3  PLT 258 232 203   Basic Metabolic Panel: Recent Labs  Lab 04/30/24 2034 05/01/24 0804 05/02/24 0251 05/03/24 0251 05/04/24 0626  NA 130* 132* 133* 136 138  K 6.1* 5.1 4.9 4.6 4.5  CL 96* 103 107 112* 112*  CO2 18* 15* 14* 14* 16*  GLUCOSE 134* 101* 115* 190* 240*  BUN 79* 69* 74* 73* 69*  CREATININE 5.46* 5.40* 5.29* 4.29* 2.87*  CALCIUM 12.5* 10.8* 10.4* 9.3 8.6*  MG 1.9  --   --   --   --    GFR: Estimated Creatinine Clearance: 18.3 mL/min (A) (by C-G formula based on SCr of 2.87 mg/dL (H)). Liver Function Tests: Recent Labs  Lab 04/30/24 0022 04/30/24 2034  AST 29 35  ALT 16 18  ALKPHOS 35* 38  BILITOT 0.3 0.4  PROT 5.7* 6.3*  ALBUMIN 3.6 4.0   No results for input(s): LIPASE, AMYLASE in the last 168 hours. No results for input(s): AMMONIA in the last 168 hours. Coagulation Profile: No results for input(s): INR, PROTIME in the last 168 hours. Cardiac Enzymes: Recent Labs  Lab 04/30/24 2034  CKTOTAL 201   BNP (last 3 results) No results for input(s): PROBNP in the last 8760 hours. HbA1C: No results for input(s): HGBA1C in the last 72 hours. CBG: Recent Labs  Lab 05/03/24 0751 05/03/24 1221 05/03/24 1633 05/03/24 2138 05/04/24 0739  GLUCAP 166* 191* 199* 289* 213*   Lipid Profile: No results for input(s): CHOL, HDL, LDLCALC, TRIG, CHOLHDL, LDLDIRECT in the last 72 hours. Thyroid Function Tests: No results for input(s): TSH, T4TOTAL, FREET4, T3FREE, THYROIDAB in the last 72 hours. Anemia Panel: No results for input(s): VITAMINB12,  FOLATE, FERRITIN, TIBC, IRON, RETICCTPCT in the last 72 hours. Sepsis Labs: No results for input(s): PROCALCITON, LATICACIDVEN in the last 168 hours.  Recent Results (from the past 240 hours)  Urine Culture     Status: Abnormal   Collection Time: 04/30/24  8:24 PM   Specimen: Urine, Random  Result Value Ref Range Status   Specimen Description   Final    URINE, RANDOM Performed at [facility], [address] [place], [place] [address]    Special Requests   Final    NONE Reflexed from [id number] Performed at [facility], [address], [place], [place] [address]    Culture >=100,000 COLONIES/mL ESCHERICHIA COLI (A)  Final   Report Status 05/03/2024  FINAL  Final   Organism ID, Bacteria ESCHERICHIA COLI (A)  Final      Susceptibility   Escherichia coli - MIC*    AMPICILLIN >=32 RESISTANT Resistant     CEFAZOLIN (URINE) Value in next row Sensitive      8 SENSITIVEThis is a modified FDA-approved test that has been validated and its performance characteristics determined by the reporting laboratory.  This laboratory is certified under the Clinical Laboratory Improvement Amendments CLIA as qualified to perform high complexity clinical laboratory testing.    CEFEPIME Value in next row Sensitive      8 SENSITIVEThis is a modified FDA-approved test that has been validated and its performance characteristics determined by the reporting laboratory.  This laboratory is certified under the Clinical Laboratory Improvement Amendments CLIA as qualified to perform high complexity clinical laboratory testing.    ERTAPENEM Value in next row Sensitive      8 SENSITIVEThis is a modified FDA-approved test that has been validated and its performance characteristics determined by the reporting laboratory.  This laboratory is certified under the Clinical Laboratory Improvement Amendments CLIA as qualified to perform high complexity clinical laboratory testing.     CEFTRIAXONE  Value in next row Sensitive      8 SENSITIVEThis is a modified FDA-approved test that has been validated and its performance characteristics determined by the reporting laboratory.  This laboratory is certified under the Clinical Laboratory Improvement Amendments CLIA as qualified to perform high complexity clinical laboratory testing.    CIPROFLOXACIN Value in next row Sensitive      8 SENSITIVEThis is a modified FDA-approved test that has been validated and its performance characteristics determined by the reporting laboratory.  This laboratory is certified under the Clinical Laboratory Improvement Amendments CLIA as qualified to perform high complexity clinical laboratory testing.    GENTAMICIN Value in next row Sensitive      8 SENSITIVEThis is a modified FDA-approved test that has been validated and its performance characteristics determined by the reporting laboratory.  This laboratory is certified under the Clinical Laboratory Improvement Amendments CLIA as qualified to perform high complexity clinical laboratory testing.    NITROFURANTOIN Value in next row Sensitive      8 SENSITIVEThis is a modified FDA-approved test that has been validated and its performance characteristics determined by the reporting laboratory.  This laboratory is certified under the Clinical Laboratory Improvement Amendments CLIA as qualified to perform high complexity clinical laboratory testing.    TRIMETH/SULFA Value in next row Sensitive      8 SENSITIVEThis is a modified FDA-approved test that has been validated and its performance characteristics determined by the reporting laboratory.  This laboratory is certified under the Clinical Laboratory Improvement Amendments CLIA as qualified to perform high complexity clinical laboratory testing.    AMPICILLIN/SULBACTAM Value in next row Resistant      8 SENSITIVEThis is a modified FDA-approved test that has been validated and its performance characteristics  determined by the reporting laboratory.  This laboratory is certified under the Clinical Laboratory Improvement Amendments CLIA as qualified to perform high complexity clinical laboratory testing.    PIP/TAZO Value in next row Sensitive      <=4 SENSITIVEThis is a modified FDA-approved test that has been validated and its performance characteristics determined by the reporting laboratory.  This laboratory is certified under the Clinical Laboratory Improvement Amendments CLIA as qualified to perform high complexity clinical laboratory testing.    MEROPENEM Value in next row Sensitive      <=  4 SENSITIVEThis is a modified FDA-approved test that has been validated and its performance characteristics determined by the reporting laboratory.  This laboratory is certified under the Clinical Laboratory Improvement Amendments CLIA as qualified to perform high complexity clinical laboratory testing.    * >=100,000 COLONIES/mL ESCHERICHIA COLI  C Difficile Quick Screen w PCR reflex     Status: None   Collection Time: 04/30/24 10:05 PM   Specimen: STOOL  Result Value Ref Range Status   C Diff antigen NEGATIVE NEGATIVE Final   C Diff toxin NEGATIVE NEGATIVE Final   C Diff interpretation No C. difficile detected.  Final    Comment: Performed at [facility], [address] [place], [place] [address]  Blood culture (routine x 2)     Status: None (Preliminary result)   Collection Time: 05/01/24  4:00 AM   Specimen: BLOOD  Result Value Ref Range Status   Specimen Description   Final    BLOOD BLOOD RIGHT ARM Performed at [facility], [address] [place], [place] [address]    Special Requests   Final    BOTTLES DRAWN AEROBIC ONLY Blood Culture results may not be optimal due to an inadequate volume of blood received in culture bottles Performed at [facility], [address], [place], [place] [address]    Culture   Final    NO GROWTH 3  DAYS Performed at [facility], [address] [place], [place] [address]    Report Status PENDING  Incomplete  Blood culture (routine x 2)     Status: None (Preliminary result)   Collection Time: 05/01/24  4:04 AM   Specimen: BLOOD  Result Value Ref Range Status   Specimen Description   Final    BLOOD LEFT ANTECUBITAL Performed at [facility], [address], [place], [place] [address]    Special Requests   Final    BOTTLES DRAWN AEROBIC AND ANAEROBIC Blood Culture results may not be optimal due to an inadequate volume of blood received in culture bottles Performed at [facility], [address] [place], [place] [address]    Culture   Final    NO GROWTH 3 DAYS Performed at [facility], [address], [place], [place] [address]    Report Status PENDING  Incomplete         Radiology Studies: US  RENAL Result Date: 05/04/2024 EXAM: US  Retroperitoneum Complete, Renal. 05/03/2024 03:06:29 PM TECHNIQUE: Real-time ultrasonography of the retroperitoneum renal was performed. COMPARISON: None available CLINICAL HISTORY: Acute kidney injury, ascites. FINDINGS: FINDINGS: RIGHT KIDNEY/URETER: Right kidney measures 11.7 x 5.0 x 5.1 cm. The right renal parenchyma is isoechoic to the liver. No hydronephrosis. No calculus. No mass. LEFT KIDNEY/URETER: Left kidney measures 10.0 x 5.6 x 5.1 cm. The left renal parenchyma is isoechoic to the spleen. No hydronephrosis. No calculus. No mass. BLADDER: A foley catheter is in place. Gallbladder is collapsed. IMPRESSION: 1. The renal parenchyma is somewhat hyperechoic bilaterally. This is nonspecific, but can be seen in the setting of medical renal disease. 2. Foley catheter in situ. Electronically signed by: Lonni Necessary MD 05/04/2024 08:52 AM EST RP Workstation: [id number]     Scheduled Meds:  Chlorhexidine  Gluconate Cloth  6 each Topical Daily   feeding supplement  237 mL Oral BID BM   heparin    5,000 Units Subcutaneous Q8H   hydrocortisone  sod succinate (SOLU-CORTEF ) inj  50 mg Intravenous Q8H   insulin   aspart  0-5 Units Subcutaneous QHS   insulin  aspart  0-6 Units Subcutaneous TID WC   levETIRAcetam   500 mg Oral QHS   midodrine   10 mg Oral TID WC   pantoprazole   40 mg Oral Daily   simvastatin   40 mg Oral q1800   Continuous Infusions:  sodium chloride  50 mL/hr at 05/04/24 0800   cefTRIAXone  (ROCEPHIN )  IV Stopped (05/03/24 2330)     LOS: 4 days    Sonya KANDICE Hoots, MD 05/04/2024, 9:34 AM   "

## 2024-05-05 ENCOUNTER — Inpatient Hospital Stay (HOSPITAL_COMMUNITY)

## 2024-05-05 ENCOUNTER — Other Ambulatory Visit: Payer: Self-pay | Admitting: Student

## 2024-05-05 DIAGNOSIS — N179 Acute kidney failure, unspecified: Secondary | ICD-10-CM | POA: Diagnosis not present

## 2024-05-05 LAB — COMPREHENSIVE METABOLIC PANEL WITH GFR
ALT: 25 U/L (ref 0–44)
AST: 23 U/L (ref 15–41)
Albumin: 3.3 g/dL — ABNORMAL LOW (ref 3.5–5.0)
Alkaline Phosphatase: 32 U/L — ABNORMAL LOW (ref 38–126)
Anion gap: 11 (ref 5–15)
BUN: 67 mg/dL — ABNORMAL HIGH (ref 8–23)
CO2: 16 mmol/L — ABNORMAL LOW (ref 22–32)
Calcium: 8.8 mg/dL — ABNORMAL LOW (ref 8.9–10.3)
Chloride: 114 mmol/L — ABNORMAL HIGH (ref 98–111)
Creatinine, Ser: 2.15 mg/dL — ABNORMAL HIGH (ref 0.44–1.00)
GFR, Estimated: 23 mL/min — ABNORMAL LOW
Glucose, Bld: 184 mg/dL — ABNORMAL HIGH (ref 70–99)
Potassium: 4.2 mmol/L (ref 3.5–5.1)
Sodium: 140 mmol/L (ref 135–145)
Total Bilirubin: <0.2 mg/dL (ref 0.0–1.2)
Total Protein: 5.3 g/dL — ABNORMAL LOW (ref 6.5–8.1)

## 2024-05-05 LAB — CBC
HCT: 29.5 % — ABNORMAL LOW (ref 36.0–46.0)
Hemoglobin: 9.9 g/dL — ABNORMAL LOW (ref 12.0–15.0)
MCH: 30.6 pg (ref 26.0–34.0)
MCHC: 33.6 g/dL (ref 30.0–36.0)
MCV: 91 fL (ref 80.0–100.0)
Platelets: 225 K/uL (ref 150–400)
RBC: 3.24 MIL/uL — ABNORMAL LOW (ref 3.87–5.11)
RDW: 15.6 % — ABNORMAL HIGH (ref 11.5–15.5)
WBC: 8.8 K/uL (ref 4.0–10.5)
nRBC: 0 % (ref 0.0–0.2)

## 2024-05-05 LAB — GLUCOSE, CAPILLARY
Glucose-Capillary: 175 mg/dL — ABNORMAL HIGH (ref 70–99)
Glucose-Capillary: 289 mg/dL — ABNORMAL HIGH (ref 70–99)
Glucose-Capillary: 206 mg/dL — ABNORMAL HIGH (ref 70–99)
Glucose-Capillary: 181 mg/dL — ABNORMAL HIGH (ref 70–99)

## 2024-05-05 LAB — MAGNESIUM: Magnesium: 1.4 mg/dL — ABNORMAL LOW (ref 1.7–2.4)

## 2024-05-05 LAB — TROPONIN T, HIGH SENSITIVITY: Troponin T High Sensitivity: 55 ng/L — ABNORMAL HIGH (ref 0–19)

## 2024-05-05 MED ORDER — HYDROCORTISONE SOD SUC (PF) 100 MG IJ SOLR: 50.0000 mg | Freq: Two times a day (BID) | INTRAMUSCULAR | Status: DC

## 2024-05-05 MED ORDER — POLYETHYLENE GLYCOL 3350 17 G PO PACK
17.0000 g | PACK | Freq: Every day | ORAL | Status: DC
  Administered 2024-05-05: 17 g via ORAL
  Filled 2024-05-05: qty 1

## 2024-05-05 MED ORDER — DESMOPRESSIN ACETATE 0.1 MG PO TABS
0.1000 mg | ORAL_TABLET | Freq: Every day | ORAL | Status: DC
  Administered 2024-05-05 – 2024-05-25 (×21): 0.1 mg via ORAL
  Filled 2024-05-05 (×22): qty 1

## 2024-05-05 MED ORDER — CARVEDILOL 3.125 MG PO TABS
3.1250 mg | ORAL_TABLET | Freq: Two times a day (BID) | ORAL | Status: DC
  Administered 2024-05-05: 3.125 mg via ORAL
  Filled 2024-05-05: qty 1

## 2024-05-05 MED ORDER — HYDROCORTISONE SOD SUC (PF) 100 MG IJ SOLR
50.0000 mg | Freq: Every day | INTRAMUSCULAR | Status: DC
  Administered 2024-05-05: 50 mg via INTRAVENOUS
  Filled 2024-05-05: qty 2

## 2024-05-05 MED ORDER — BISACODYL 10 MG RE SUPP
10.0000 mg | Freq: Once | RECTAL | Status: AC
  Administered 2024-05-05: 10 mg via RECTAL
  Filled 2024-05-05: qty 1

## 2024-05-05 MED ORDER — SODIUM CHLORIDE 0.9 % IV SOLN: INTRAVENOUS | Status: DC

## 2024-05-05 MED ORDER — POLYETHYLENE GLYCOL 3350 17 G PO PACK
17.0000 g | PACK | Freq: Two times a day (BID) | ORAL | Status: DC
  Administered 2024-05-05 – 2024-05-26 (×21): 17 g via ORAL
  Filled 2024-05-05 (×29): qty 1

## 2024-05-05 MED ORDER — DOCUSATE SODIUM 100 MG PO CAPS
100.0000 mg | ORAL_CAPSULE | Freq: Two times a day (BID) | ORAL | Status: DC | PRN
  Administered 2024-05-05 – 2024-05-19 (×7): 100 mg via ORAL
  Filled 2024-05-05 (×7): qty 1

## 2024-05-05 MED ORDER — MIDODRINE HCL 5 MG PO TABS
5.0000 mg | ORAL_TABLET | Freq: Three times a day (TID) | ORAL | Status: DC
  Administered 2024-05-05 – 2024-05-07 (×5): 5 mg via ORAL
  Filled 2024-05-05 (×5): qty 1

## 2024-05-05 MED ORDER — MAGNESIUM SULFATE 4 GM/100ML IV SOLN
4.0000 g | Freq: Once | INTRAVENOUS | Status: AC
  Administered 2024-05-05: 4 g via INTRAVENOUS
  Filled 2024-05-05: qty 100

## 2024-05-05 NOTE — Progress Notes (Signed)
 Physical Therapy Treatment Patient Details Name: Sonya Solomon MRN: [id number] DOB: December 22, 1945 Today's Date: 05/05/2024   History of Present Illness 79 y.o. female who presents to the ER 04/30/24 due to generalized weakness, dizziness, and a fall, hypotension, AKI on CKD  Prior medical history significant for MS, type 2 diabetes, hyperlipidemia, hypertension, obesity, history of gait disturbance, vertigo, urinary urgency, spasticity, insomnia    PT Comments  Pt in bed, upset that staff keep coming into the room as she wants to sleep due to only getting 1 hour of sleep last night. PT explained the reason for therapy and wanting to do some additional assessment of RLE, pt agreeable in time. Pt attempted to explain timeline of onset of RLE weakness stating there was an episode in April/May when she was gardening where the RLE began to shake/tremor and then stopped. Pt goes on to state it was a couple months later when the weakness happened out of no where. She reports still being able to walk with RW for a time but then needed assist from her spouse and use of transport chair. Pt unable to describe how she was able to move RLE and walk in presence of weakness and became frustrated with PT's questions. Able to redirect pt and she goes on to state her spouse had to help her in/out of bed at time, help with transfers, help with lower body dressing and footwear; could not offer additional detail as to how much help or how long he has had to help her. MMT completed on RLE with significant weakness; see General Comments below. Based on prolonged weakness (roughly 6 months), no transportation or physical assistance at home as her spouse is hurt and unable to move around or walk, and limited participation in therapy while in acute, PT is changing discharge recommendation to SNF for <3hrs/day of therapy. Pt does not have adequate support at home, very anxious with mobility and currently declining OOB activity  until she gets to rehab facility. Attempted to educate the patient regarding mobility and OOB activity as a warm up for rehab, she continued to refuse and became upset again. Encouraged her to complete bed level exercises as able on BLE to maintain ROM and current strength levels. PT will continue to follow pt while she is admitted to acute.     If plan is discharge home, recommend the following: A lot of help with walking and/or transfers;A lot of help with bathing/dressing/bathroom;Direct supervision/assist for medications management;Assist for transportation;Assistance with cooking/housework;Help with stairs or ramp for entrance   Can travel by private vehicle     No  Equipment Recommendations  None recommended by PT    Recommendations for Other Services       Precautions / Restrictions Precautions Precautions: Fall Precaution/Restrictions Comments: right LE very weak: dragged LE PTA but amb. w/ RW Restrictions Weight Bearing Restrictions Per Provider Order: No Other Position/Activity Restrictions: baseline R sided weakness     Mobility  Bed Mobility               General bed mobility comments: Pt refused all mobility stating no one here knows how to move people properly so I will not be moving or getting up until I go to rehab    Transfers                        Ambulation/Gait  Stairs             Wheelchair Mobility     Tilt Bed    Modified Rankin (Stroke Patients Only)       Balance                                            Communication Communication Communication: Impaired (difficulty with speech including word finding and misplacing words) Factors Affecting Communication: Difficulty expressing self  Cognition Arousal: Alert Behavior During Therapy: Anxious   PT - Cognitive impairments: Orientation, Awareness, Problem solving, Safety/Judgement, Memory                          Following commands: Intact      Cueing Cueing Techniques: Verbal cues, Tactile cues  Exercises      General Comments General comments (skin integrity, edema, etc.): RLE MMT secondary to ongoing profound weakness and MD request for additional assessment: hip flexion 2/5, hip add 2-/5, hip abd 2-/5, knee flexion 2+/5, knee extension 2-/5, DF 1/5, PF 2+/5. Sensation WFL, no numbness or tingling per pt report      Pertinent Vitals/Pain Pain Assessment Pain Assessment: No/denies pain    Home Living                          Prior Function            PT Goals (current goals can now be found in the care plan section) Acute Rehab PT Goals Patient Stated Goal: to walk again PT Goal Formulation: With patient/family Time For Goal Achievement: 05/17/24 Potential to Achieve Goals: Fair Progress towards PT goals: Not progressing toward goals - comment (pt refusing all mobility and OOB activity today)    Frequency    Min 2X/week      PT Plan      Co-evaluation              AM-PAC PT 6 Clicks Mobility   Outcome Measure  Help needed turning from your back to your side while in a flat bed without using bedrails?: A Lot Help needed moving from lying on your back to sitting on the side of a flat bed without using bedrails?: A Lot Help needed moving to and from a bed to a chair (including a wheelchair)?: Total Help needed standing up from a chair using your arms (e.g., wheelchair or bedside chair)?: Total Help needed to walk in hospital room?: Total Help needed climbing 3-5 steps with a railing? : Total 6 Click Score: 8    End of Session   Activity Tolerance: Patient limited by fatigue;Other (comment) (pt requesting to sleep as she didn't sleep last night, also refusing mobility and OOB activity until she goes to rehab due to bad experiences with staff throwing her onto the bed and dragging her up) Patient left: in bed;with call bell/phone within  reach Nurse Communication: Other (comment) (strength assessment, mobility refusal) PT Visit Diagnosis: Unsteadiness on feet (R26.81);Other abnormalities of gait and mobility (R26.89);Repeated falls (R29.6);Muscle weakness (generalized) (M62.81);History of falling (Z91.81);Difficulty in walking, not elsewhere classified (R26.2);Other symptoms and signs involving the nervous system (M70.101)     Time: 8982-8954 PT Time Calculation (min) (ACUTE ONLY): 28 min  Charges:    $Therapeutic Activity: 23-37 mins  Isaiah DEL. Jhonatan Lomeli, PT, DPT   [employer] 05/05/2024, 12:14 PM

## 2024-05-05 NOTE — Progress Notes (Addendum)
 " PROGRESS NOTE    Sonya Solomon  FMW:[id number] DOB: Apr 17, 1946 DOA: 04/30/2024 PCP: Leonel Cole, MD  Brief Narrative: 79 y.o. female with medical history significant for MS, type 2 diabetes, hyperlipidemia, hypertension, obesity, history of gait disturbance, vertigo, urinary urgency, spasticity, insomnia, who presents to the ER due to generalized weakness, dizziness, and a fall.  She was on the floor for about 4 hours before the husband could get help and get her off the floor.  She has been having diarrhea for few days prior to admission.  In the ED her systolic blood pressure was in the 70s diastolic in the 40s.  She was found to have an AKI on CKD stage III AA with a creatinine of 5.4 and admitted for the same.  At baseline she walks with a walker and a cane independent with all ADLs but does not cook or clean the house.  She lives at home with her husband.   Assessment & Plan:   Principal Problem:   AKI (acute kidney injury) Active Problems:   MS (multiple sclerosis)   Hyperlipidemia associated with type 2 diabetes mellitus (HCC)   CKD stage 3a, GFR 45-59 ml/min (HCC)   Hyperkalemia   Demand ischemia (HCC)   Pyuria   Diarrhea   Seizure (HCC)   HTN (hypertension)    AKI on CKD stage IIIa baseline creatinine is around 1.1.  She was found to have a creatinine of 5.4 on admission due to dehydration poor p.o. intake and diarrhea and hypotension.  She was given IV fluid boluses and admitted.  Creatinine is improving but slow 2.87 from 4.2 from 5.4 on admission.  Renal ultrasound shows some evidence of medical renal disease with no obstruction.  Foley in place.  Hypotension blood pressure 142/66 improving on midodrine  and Solu-Cortef  and IV fluids.  Will taper Solu-Cortef  and midodrine .  E. coli UTI, POA  - Urine culture showing E. coli, sensitive to Rocephin . - Rocephin     Hyperkalemia - Resolved  Hypomagnesemia magnesium 1.4 replete  PVCs telemetry called to report that she had  2 episodes of V. tach first time 10 beats next time 17 beats patient was asymptomatic and resting in bed.  Echo done during this admission shows severe LVH with a concern for HOCM question?   Demand ischemia - Troponin trend relatively flat 126, 141, 169 - Echocardiogram negative for regional wall motion abnormality, EF 70 to 75%, grade 1 diastolic dysfunction, elevated pulmonary artery systolic pressure, LV hypertrophy severe?  Question need for cardiac MRI cardiology consulted.  Appreciate their input.   Diarrhea - C. difficile negative - GI PCR panel ordered but has not had a bowel movement since admission.    Seizure disorder - Keppra , dose adjusted for renal function   MS - PT OT   Hypertension - Lisinopril  on hold due to AKI   Diabetes mellitus - A1c 7.0 - Sliding scale insulin    Hyperlipidemia - Zocor     Estimated body mass index is 33.38 kg/m as calculated from the following:   Height as of this encounter: 5' 4.5 (1.638 m).   Weight as of this encounter: 89.6 kg.  DVT prophylaxis: Heparin   code Status: Full code  family Communication: with patient's husband on the phone  disposition Plan:  Status is: Inpatient Remains inpatient appropriate because: Persistent AKI   Consultants:  None  Procedures: None Antimicrobials:-Rocephin   Subjective:  Patient denies any chest pain shortness of breath creatinine improving blood pressure improved Foley still in place  chronic bilateral lower extremity swelling with history of MS   Objective: Vitals:   05/04/24 1700 05/04/24 1818 05/04/24 2130 05/05/24 0526  BP:  129/75 132/65 126/71  Pulse:  66 (!) 49 (!) 54  Resp: 18 18 20 18   Temp:  98.8 F (37.1 C) 97.7 F (36.5 C) 98.4 F (36.9 C)  TempSrc:  Oral Oral Oral  SpO2:  97%    Weight:      Height:        Intake/Output Summary (Last 24 hours) at 05/05/2024 1041 Last data filed at 05/05/2024 0530 Gross per 24 hour  Intake 393.79 ml  Output 525 ml  Net -131.21 ml    Filed Weights   04/30/24 2001 04/30/24 2015  Weight: 89.6 kg 89.6 kg    Examination:  General exam: Appears  no acute distress Respiratory system: Clear to auscultation. Respiratory effort normal.  Diminished breath sounds Cardiovascular system:reg  Gastrointestinal system: Abdomen is nondistended, soft and nontender. No organomegaly or masses felt. Normal bowel sounds heard. Central nervous system: Alert and oriented. Extremities: 1+ bilateral feet edema peers to be chronic    Data Reviewed: I have personally reviewed following labs and imaging studies  CBC: Recent Labs  Lab 04/30/24 2034 05/01/24 0804 05/04/24 0626 05/05/24 0627  WBC 14.4* 10.2 7.0 8.8  NEUTROABS 11.9*  --   --   --   HGB 12.0 10.6* 9.2* 9.9*  HCT 35.9* 32.3* 27.0* 29.5*  MCV 90.4 92.0 90.3 91.0  PLT 258 232 203 225   Basic Metabolic Panel: Recent Labs  Lab 04/30/24 2034 05/01/24 0804 05/02/24 0251 05/03/24 0251 05/04/24 0626 05/05/24 0627  NA 130* 132* 133* 136 138 140  K 6.1* 5.1 4.9 4.6 4.5 4.2  CL 96* 103 107 112* 112* 114*  CO2 18* 15* 14* 14* 16* 16*  GLUCOSE 134* 101* 115* 190* 240* 184*  BUN 79* 69* 74* 73* 69* 67*  CREATININE 5.46* 5.40* 5.29* 4.29* 2.87* 2.15*  CALCIUM 12.5* 10.8* 10.4* 9.3 8.6* 8.8*  MG 1.9  --   --   --   --   --    GFR: Estimated Creatinine Clearance: 24.4 mL/min (A) (by C-G formula based on SCr of 2.15 mg/dL (H)). Liver Function Tests: Recent Labs  Lab 04/30/24 0022 04/30/24 2034 05/05/24 0627  AST 29 35 23  ALT 16 18 25   ALKPHOS 35* 38 32*  BILITOT 0.3 0.4 <0.2  PROT 5.7* 6.3* 5.3*  ALBUMIN 3.6 4.0 3.3*   No results for input(s): LIPASE, AMYLASE in the last 168 hours. No results for input(s): AMMONIA in the last 168 hours. Coagulation Profile: No results for input(s): INR, PROTIME in the last 168 hours. Cardiac Enzymes: Recent Labs  Lab 04/30/24 2034  CKTOTAL 201   BNP (last 3 results) No results for input(s): PROBNP in the  last 8760 hours. HbA1C: No results for input(s): HGBA1C in the last 72 hours. CBG: Recent Labs  Lab 05/03/24 1633 05/03/24 2138 05/04/24 0739 05/04/24 2220 05/05/24 0709  GLUCAP 199* 289* 213* 221* 175*   Lipid Profile: No results for input(s): CHOL, HDL, LDLCALC, TRIG, CHOLHDL, LDLDIRECT in the last 72 hours. Thyroid Function Tests: No results for input(s): TSH, T4TOTAL, FREET4, T3FREE, THYROIDAB in the last 72 hours. Anemia Panel: No results for input(s): VITAMINB12, FOLATE, FERRITIN, TIBC, IRON, RETICCTPCT in the last 72 hours. Sepsis Labs: No results for input(s): PROCALCITON, LATICACIDVEN in the last 168 hours.  Recent Results (from the past 240 hours)  Urine Culture  Status: Abnormal   Collection Time: 04/30/24  8:24 PM   Specimen: Urine, Random  Result Value Ref Range Status   Specimen Description   Final    URINE, RANDOM Performed at [facility], [address] [place], [place] [address]    Special Requests   Final    NONE Reflexed from [phone] Performed at [facility], [address] [place], [place] [address]    Culture >=100,000 COLONIES/mL ESCHERICHIA COLI (A)  Final   Report Status 05/03/2024 FINAL  Final   Organism ID, Bacteria ESCHERICHIA COLI (A)  Final      Susceptibility   Escherichia coli - MIC*    AMPICILLIN >=32 RESISTANT Resistant     CEFAZOLIN (URINE) Value in next row Sensitive      8 SENSITIVEThis is a modified FDA-approved test that has been validated and its performance characteristics determined by the reporting laboratory.  This laboratory is certified under the Clinical Laboratory Improvement Amendments CLIA as qualified to perform high complexity clinical laboratory testing.    CEFEPIME Value in next row Sensitive      8 SENSITIVEThis is a modified FDA-approved test that has been validated and its performance characteristics determined by the reporting  laboratory.  This laboratory is certified under the Clinical Laboratory Improvement Amendments CLIA as qualified to perform high complexity clinical laboratory testing.    ERTAPENEM Value in next row Sensitive      8 SENSITIVEThis is a modified FDA-approved test that has been validated and its performance characteristics determined by the reporting laboratory.  This laboratory is certified under the Clinical Laboratory Improvement Amendments CLIA as qualified to perform high complexity clinical laboratory testing.    CEFTRIAXONE  Value in next row Sensitive      8 SENSITIVEThis is a modified FDA-approved test that has been validated and its performance characteristics determined by the reporting laboratory.  This laboratory is certified under the Clinical Laboratory Improvement Amendments CLIA as qualified to perform high complexity clinical laboratory testing.    CIPROFLOXACIN Value in next row Sensitive      8 SENSITIVEThis is a modified FDA-approved test that has been validated and its performance characteristics determined by the reporting laboratory.  This laboratory is certified under the Clinical Laboratory Improvement Amendments CLIA as qualified to perform high complexity clinical laboratory testing.    GENTAMICIN Value in next row Sensitive      8 SENSITIVEThis is a modified FDA-approved test that has been validated and its performance characteristics determined by the reporting laboratory.  This laboratory is certified under the Clinical Laboratory Improvement Amendments CLIA as qualified to perform high complexity clinical laboratory testing.    NITROFURANTOIN Value in next row Sensitive      8 SENSITIVEThis is a modified FDA-approved test that has been validated and its performance characteristics determined by the reporting laboratory.  This laboratory is certified under the Clinical Laboratory Improvement Amendments CLIA as qualified to perform high complexity clinical laboratory testing.     TRIMETH/SULFA Value in next row Sensitive      8 SENSITIVEThis is a modified FDA-approved test that has been validated and its performance characteristics determined by the reporting laboratory.  This laboratory is certified under the Clinical Laboratory Improvement Amendments CLIA as qualified to perform high complexity clinical laboratory testing.    AMPICILLIN/SULBACTAM Value in next row Resistant      8 SENSITIVEThis is a modified FDA-approved test that has been validated and its performance characteristics determined by the reporting laboratory.  This laboratory is certified under the Clinical Laboratory Improvement Amendments CLIA as qualified to perform high complexity clinical laboratory testing.    PIP/TAZO Value in next row Sensitive      <=4 SENSITIVEThis is a modified FDA-approved test that has been validated and its performance characteristics determined by the reporting laboratory.  This laboratory is certified under the Clinical Laboratory Improvement Amendments CLIA as qualified to perform high complexity clinical laboratory testing.    MEROPENEM Value in next row Sensitive      <=4 SENSITIVEThis is a modified FDA-approved test that has been validated and its performance characteristics determined by the reporting laboratory.  This laboratory is certified under the Clinical Laboratory Improvement Amendments CLIA as qualified to perform high complexity clinical laboratory testing.    * >=100,000 COLONIES/mL ESCHERICHIA COLI  C Difficile Quick Screen w PCR reflex     Status: None   Collection Time: 04/30/24 10:05 PM   Specimen: STOOL  Result Value Ref Range Status   C Diff antigen NEGATIVE NEGATIVE Final   C Diff toxin NEGATIVE NEGATIVE Final   C Diff interpretation No C. difficile detected.  Final    Comment: Performed at [facility], [address], [place], [place] [address]  Blood culture (routine x 2)     Status: None (Preliminary result)   Collection Time:  05/01/24  4:00 AM   Specimen: BLOOD  Result Value Ref Range Status   Specimen Description   Final    BLOOD BLOOD RIGHT ARM Performed at [facility], [address] [place], [place] [address]    Special Requests   Final    BOTTLES DRAWN AEROBIC ONLY Blood Culture results may not be optimal due to an inadequate volume of blood received in culture bottles Performed at [facility], [address] [place], [place] [address]    Culture   Final    NO GROWTH 4 DAYS Performed at [facility], [address], [place], [place] [address]    Report Status PENDING  Incomplete  Blood culture (routine x 2)     Status: None (Preliminary result)   Collection Time: 05/01/24  4:04 AM   Specimen: BLOOD  Result Value Ref Range Status   Specimen Description   Final    BLOOD LEFT ANTECUBITAL Performed at [facility], [address] [place], [place] [address]    Special Requests   Final    BOTTLES DRAWN AEROBIC AND ANAEROBIC Blood Culture results may not be optimal due to an inadequate volume of blood received in culture bottles Performed at [facility], [address], [place], [place] [address]    Culture   Final    NO GROWTH 4 DAYS Performed at [facility], [address], [place], [place] [address]    Report Status PENDING  Incomplete         Radiology Studies: DG Chest 1 View Result Date: 05/04/2024 CLINICAL DATA:  Shortness of breath. EXAM: CHEST  1 VIEW COMPARISON:  04/30/2024 FINDINGS: Lung volumes are low.The cardiomediastinal contours are stable. Bronchovascular crowding related to low lung volumes. No consolidation, pleural effusion, or pneumothorax. No acute osseous abnormalities are seen. IMPRESSION: Low lung volumes without acute abnormality. Electronically Signed   By: Andrea Gasman M.D.   On: 05/04/2024 14:15   US  RENAL Result Date: 05/04/2024 EXAM: US  Retroperitoneum Complete, Renal.  05/03/2024 03:06:29 PM TECHNIQUE: Real-time ultrasonography of the retroperitoneum renal was performed. COMPARISON:  None available CLINICAL HISTORY: Acute kidney injury, ascites. FINDINGS: FINDINGS: RIGHT KIDNEY/URETER: Right kidney measures 11.7 x 5.0 x 5.1 cm. The right renal parenchyma is isoechoic to the liver. No hydronephrosis. No calculus. No mass. LEFT KIDNEY/URETER: Left kidney measures 10.0 x 5.6 x 5.1 cm. The left renal parenchyma is isoechoic to the spleen. No hydronephrosis. No calculus. No mass. BLADDER: A foley catheter is in place. Gallbladder is collapsed. IMPRESSION: 1. The renal parenchyma is somewhat hyperechoic bilaterally. This is nonspecific, but can be seen in the setting of medical renal disease. 2. Foley catheter in situ. Electronically signed by: Lonni Necessary MD 05/04/2024 08:52 AM EST RP Workstation: [id number]     Scheduled Meds:  Chlorhexidine  Gluconate Cloth  6 each Topical Daily   feeding supplement  237 mL Oral BID BM   heparin   5,000 Units Subcutaneous Q8H   hydrocortisone  sod succinate (SOLU-CORTEF ) inj  50 mg Intravenous Q12H   insulin  aspart  0-5 Units Subcutaneous QHS   insulin  aspart  0-6 Units Subcutaneous TID WC   levETIRAcetam   500 mg Oral BID   midodrine   10 mg Oral TID WC   pantoprazole   40 mg Oral Daily   polyethylene glycol  17 g Oral Daily   simvastatin   40 mg Oral q1800   Continuous Infusions:  cefTRIAXone  (ROCEPHIN )  IV 1 g (05/04/24 2256)     LOS: 5 days    Almarie KANDICE Hoots, MD 05/05/2024, 10:41 AM   "

## 2024-05-05 NOTE — Progress Notes (Unsigned)
"  error  "

## 2024-05-05 NOTE — Plan of Care (Signed)

## 2024-05-05 NOTE — Progress Notes (Addendum)
" °  Inpatient Rehabilitation Admissions Coordinator   I spoke with patient's spouse by phone for rehab assessment. We discussed goals and expectations of a possible CIR admit. Prior to admit patient was Mod I with RW and completes own adls. Does need significant assistance on entry  stairs into their home. She has not received inpatient therapy before per spouse. His goal would for her to be able to ambulate to bathroom prior to return home. He does prefer Cir level rehab if possible. I will follow her progress with therapy and follow up on Monday her tolerance for more intensive therapies. She would benefit from CIR admit. Spouse can provide expected caregiver support that is recommended.. Please call me with any questions.   Heron Leavell, RN, MSN Rehab Admissions Coordinator [phone]    I clarified with spouse by phone after therapy assessment today. Spouse hurt his back when she fell but he is On the mend. He does not use an AD. He only uses the transport chair for her outside of the home. Otherwise she is on RW. I explained to him that unless she participates and show tolerance for intensive therapies, I would not pursue CIR admit. He states he will discuss with her.  Heron Leavell, RN, MSN Rehab Admissions Coordinator [phone] 05/05/2024 12:40 PM  "

## 2024-05-05 NOTE — Plan of Care (Signed)
" °  Problem: Coping: Goal: Ability to adjust to condition or change in health will improve Outcome: Progressing   Problem: Health Behavior/Discharge Planning: Goal: Ability to manage health-related needs will improve Outcome: Progressing   Problem: Nutritional: Goal: Maintenance of adequate nutrition will improve Outcome: Progressing   Problem: Clinical Measurements: Goal: Ability to maintain clinical measurements within normal limits will improve Outcome: Progressing Goal: Diagnostic test results will improve Outcome: Progressing   "

## 2024-05-05 NOTE — PMR Pre-admission (Shared)
 PMR Admission Coordinator Pre-Admission Assessment  Patient: Sonya Solomon is an 79 y.o., female MRN: [id number] DOB: May 11, 1945 Height: 5' 4.5 (163.8 cm) Weight: 89.6 kg  Insurance Information HMO:     PPO:      PCP:      IPA:      80/20:      OTHER:  PRIMARY: Medicare Part A and B      Policy#: [id number]      Subscriber: pt Benefits:  Phone #: passport one source online      Name: 1/2 Eff. Date: a 07/02/2012 and b 02/02/2016   Deduct: $1736      Out of Pocket Max: none      Life Max: none CIR: 100%      SNF: 20 full days Outpatient: 80%     Co-Pay: 20% Home Health: 100%      Co-Pay:  DME: 80%     Co-Pay: 20% Providers: pt choice  SECONDARY: BCBS supplement      Policy#: [id number]     Phone#:   Financial Counselor:       Phone#:   The [employer] for patients in Inpatient Rehabilitation Facilities with attached Privacy Act Statement-Health Care Records was provided and verbally reviewed with: Patient and Family  Emergency Contact Information Contact Information     Name Relation Home Work Mobile   Pembleton,Richard Spouse [phone]        Other Contacts   None on File    Current Medical History  Patient Admitting Diagnosis: Debility  History of Present Illness: 79 yo female with history of MS, type 2 DM, HLD, HTN, obesity, gait disturbance, vertigo, urinary urgency, spasticity, insomnia who presented on 04/30/24 with generalized weakness, dizziness and a fall. Was on the floor for 4 days before spouse could get her off the floor. Spouse endorses recent onset of diarrhea and with poor po intake.   Urine culture showing e coli sensitive to Rocephin .  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 Neuro and Onc consulted. To check chest CT. Decadron  for edema.  AKI on CKD stage IIIa with baseline felt around 1.1, on admit creat 5.4. She was treated with IVF. Renal ultrasound shows some evidence of medical renal disease with no  obstruction. Foley placed.  Hypotension resolved and treated with midodrine , Solu cortef  and IVF. Decadron  1/3 for brain lesions. Solu cortef  stopped. Midodrine  being tapered off.  Cardiology consulted with demand ischemia. EF 70 to 75 % grade 1 diastolic dysfunction, elevated pulmonary artery systolic pressure, LV hypertrophy severe with question need for Cardiac MRI.    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  SSI for DM with Hgb A1c 7.0. Zocor  for HLD. Heparin  for DVT prophylaxis.  Patient[facility] medical [facility] has been reviewed by the Rehab Admissions Coordinator and physician.  Past Medical History  Past Medical History:  Diagnosis Date   Diabetes mellitus without complication (HCC)    MS (multiple sclerosis)    Has the patient had major surgery during 100 days prior to admission? No  Family History   family history includes Breast cancer in her mother; Heart attack in her father.  Current Medications Current Medications[1]  Patients Current Diet:  Diet Order             Diet heart healthy/carb modified Room service appropriate? Yes; Fluid consistency: Thin  Diet effective now  Precautions / Restrictions Precautions Precautions: Fall Precaution/Restrictions Comments: right LE very weak: dragged LE PTA but amb. w/ RW Restrictions Weight Bearing Restrictions Per Provider Order: No Other Position/Activity Restrictions: baseline R sided weakness   Has the patient had 2 or more falls or a fall with injury in the past year? No  Prior Activity Level Limited Community (1-2x/wk): Mod I with RW, does not drive Community (4-2k/tx): uses transport chair in the community  Prior Functional Level Self Care: Did the patient need help bathing, dressing, using the toilet or eating? Needed some help  Indoor Mobility: Did the patient need assistance with walking from room to room  (with or without device)? Independent  Stairs: Did the patient need assistance with internal or external stairs (with or without device)? Needed some help  Functional Cognition: Did the patient need help planning regular tasks such as shopping or remembering to take medications? Independent  Patient Information Are you of Hispanic, Latino/a,or Spanish origin?: A. No, not of Hispanic, Latino/a, or Spanish origin What is your race?: A. White Do you need or want an interpreter to communicate with a doctor or health care staff?: 0. No  Patient's Response To:  Health Literacy and Transportation Is the patient able to respond to health literacy and transportation needs?: Yes Health Literacy - How often do you need to have someone help you when you read instructions, pamphlets, or other written material from your doctor or pharmacy?: Never In the past 12 months, has lack of transportation kept you from medical appointments or from getting medications?: No In the past 12 months, has lack of transportation kept you from meetings, work, or from getting things needed for daily living?: No  Home Assistive Devices / Equipment Home Equipment: Grab bars - tub/shower, Rollator (4 wheels), Agricultural Consultant (2 wheels)  Prior Device Use: Indicate devices/aids used by the patient prior to current illness, exacerbation or injury? Walker  Current Functional Level Cognition  Orientation Level: Oriented X4    Extremity Assessment (includes Sensation/Coordination)  Upper Extremity Assessment: Right hand dominant, RUE deficits/detail, LUE deficits/detail RUE Deficits / Details: Required AAROM for achieve full ROM for shoulder flexion. Elbow and hand AROM WFL. Biceps, triceps and grip strength grossly 4-/5 to 4/5. Baseline RUE weakness from MS. Chronic numbness of her fingers. RUE Sensation: decreased light touch RUE Coordination: decreased fine motor LUE Deficits / Details: AROM and strength WFL. Gross strength  4/5 to 4+/5  Lower Extremity Assessment: RLE deficits/detail, Generalized weakness RLE Deficits / Details: Unable to demo dorsi-flexion, gross weakness, required AAROM for knee flexion and extension, chronic distal LE numbness from MS RLE Sensation: decreased light touch, decreased proprioception RLE Coordination: decreased gross motor    ADLs  Overall ADL's : Needs assistance/impaired Eating/Feeding: Set up, Bed level Grooming: Contact guard assist, Sitting Grooming Details (indicate cue type and reason): simulated seated EOB Upper Body Bathing: Contact guard assist, Sitting Lower Body Bathing: Maximal assistance, Sitting/lateral leans Upper Body Dressing : Minimal assistance, Sitting Upper Body Dressing Details (indicate cue type and reason): simulated seated EOB Lower Body Dressing: Total assistance, Sitting/lateral leans Lower Body Dressing Details (indicate cue type and reason): assist needed to donn both socks seated EOB Toileting- Clothing Manipulation and Hygiene: Maximal assistance, Bed level    Mobility  Overal bed mobility: Needs Assistance Bed Mobility: Rolling Rolling: Mod assist, Used rails Supine to sit: HOB elevated, Used rails, Max assist Sit to supine: Mod assist General bed mobility comments: Pt refused all mobility stating no one here  knows how to move people properly so I will not be moving or getting up until I go to rehab    Transfers  Overall transfer level: Needs assistance Transfers: Sit to/from Stand Sit to Stand: Mod assist Transfer via Lift Equipment: [employer] transfer comment: cues for hand placement, patient able to stand at stedy, trnasfer  seated on stedy to recliner, able to stand again.    Ambulation / Gait / Stairs / Engineer, Drilling / Balance Dynamic Sitting Balance Sitting balance - Comments: SBA to CGA Balance Overall balance assessment: Needs assistance, History of Falls Sitting-balance support: No upper  extremity supported, Feet supported Sitting balance-Leahy Scale: Fair Sitting balance - Comments: SBA to CGA Standing balance support: Reliant on assistive device for balance, During functional activity, Bilateral upper extremity supported Standing balance-Leahy Scale: Poor Standing balance comment: stands  in stedty with right knee guarded    Special considerations/life events  Fall precautions   Previous Home Environment  Living Arrangements: Spouse/significant other  Lives With: Spouse Available Help at Discharge: Family, Available 24 hours/day (spouse) Type of Home: Mobile home Home Layout: One level Home Access: Stairs to enter Entrance Stairs-Rails: Left Entrance Stairs-Number of Steps: 4 Bathroom Shower/Tub: Health Visitor: Standard Bathroom Accessibility: Yes How Accessible: Accessible via walker Home Care Services: No Additional Comments: uses RW in home. DIfficulty with steps, spouse assists with adls  Discharge Living Setting Plans for Discharge Living Setting: Patient's home, Mobile Home, Lives with (comment) (spouse) Type of Home at Discharge: Mobile home Discharge Home Layout: One level Discharge Home Access: Stairs to enter Entrance Stairs-Number of Steps: 4 Discharge Bathroom Shower/Tub: Walk-in shower Discharge Bathroom Toilet: Standard Discharge Bathroom Accessibility: Yes How Accessible: Accessible via walker Does the patient have any problems obtaining your medications?: No  Social/Family/Support Systems Patient Roles: Spouse Contact Information: spouse Anticipated Caregiver: spouse Anticipated Caregiver's Contact Information: see contacts Ability/Limitations of Caregiver: spouse I without AD Caregiver Availability: 24/7 Discharge Plan Discussed with Primary Caregiver: Yes Is Caregiver In Agreement with Plan?: Yes Does Caregiver/Family have Issues with Lodging/Transportation while Pt is in Rehab?: No  Goals Patient/Family Goal for  Rehab: supervision PT, supervision to min OT Expected length of stay: ELOS 10 to 14 days Pt/Family Agrees to Admission and willing to participate: Yes Program Orientation Provided & Reviewed with Pt/Caregiver Including Roles  & Responsibilities: Yes  Decrease burden of Care through IP rehab admission: n/a  Possible need for SNF placement upon discharge: not anticipated  Patient Condition: I have reviewed medical records from [facility], spoken with  patient and spouse. I discussed via phone for inpatient rehabilitation assessment.  Patient will benefit from ongoing PT and OT, can actively participate in 3 hours of therapy a day 5 days of the week, and can make measurable gains during the admission.  Patient will also benefit from the coordinated team approach during an Inpatient Acute Rehabilitation admission.  The patient will receive intensive therapy as well as Rehabilitation physician, nursing, social worker, and care management interventions.  Due to bladder management, bowel management, safety, skin/wound care, disease management, medication administration, pain management, and patient education the patient requires 24 hour a day rehabilitation nursing.  The patient is currently *** with mobility and basic ADLs.  Discharge setting and therapy post discharge at home with home health is anticipated.  Patient has agreed to participate in the Acute Inpatient Rehabilitation Program and will admit {Time; today/tomorrow:10263}.  Preadmission Screen Completed By:  Alison,  Heron Lot, RN MSN 05/07/2024 5:06 PM ______________________________________________________________________   Discussed status with Dr. PIERRETTE on *** at *** and received approval for admission today.  Admission Coordinator:  Alison Heron Lot, RN MSN time PIERRETTEPattricia ***   Assessment/Plan: Diagnosis: *** Does the need for close, 24 hr/day Medical supervision in concert with the patient's rehab needs make it  unreasonable for this patient to be served in a less intensive setting? {yes_no_potentially:3041433} Co-Morbidities requiring supervision/potential complications: *** Due to {due un:6958565}, does the patient require 24 hr/day rehab nursing? {yes_no_potentially:3041433} Does the patient require coordinated care of a physician, rehab nurse, PT, OT, and SLP to address physical and functional deficits in the context of the above medical diagnosis(es)? {yes_no_potentially:3041433} Addressing deficits in the following areas: {deficits:3041436} Can the patient actively participate in an intensive therapy program of at least 3 hrs of therapy 5 days a week? {yes_no_potentially:3041433} The potential for patient to make measurable gains while on inpatient rehab is {potential:3041437} Anticipated functional outcomes upon discharge from inpatient rehab: {functional outcomes:304600100} PT, {functional outcomes:304600100} OT, {functional outcomes:304600100} SLP Estimated rehab length of stay to reach the above functional goals is: *** Anticipated discharge destination: {anticipated dc setting:21604} 10. Overall Rehab/Functional Prognosis: {potential:3041437}   MD Signature: ***     [1]  Current Facility-Administered Medications:    0.9 %  sodium chloride  infusion, , Intravenous, Continuous, Will Almarie MATSU, MD, Last Rate: 75 mL/hr at 05/06/24 0247, New Bag at 05/06/24 0247   acetaminophen  (TYLENOL ) tablet 500 mg, 500 mg, Oral, Q6H PRN, Shona Terry SAILOR, DO, 500 mg at 05/01/24 9066   ALPRAZolam  (XANAX ) tablet 0.25 mg, 0.25 mg, Oral, TID PRN, Will Almarie MATSU, MD, 0.25 mg at 05/07/24 1645   Chlorhexidine  Gluconate Cloth 2 % PADS 6 each, 6 each, Topical, Daily, Rojelio Nest, DO, 6 each at 05/07/24 0910   desmopressin  (DDAVP ) tablet 0.1 mg, 0.1 mg, Oral, QHS, Will Almarie MATSU, MD, 0.1 mg at 05/06/24 2149   dexamethasone  (DECADRON ) tablet 4 mg, 4 mg, Oral, Q8H, Will Almarie MATSU, MD, 4 mg at  05/07/24 1417   docusate sodium  (COLACE) capsule 100 mg, 100 mg, Oral, BID PRN, Will Almarie MATSU, MD, 100 mg at 05/07/24 1515   feeding supplement (ENSURE PLUS HIGH PROTEIN) liquid 237 mL, 237 mL, Oral, BID BM, Rojelio Nest, DO, 237 mL at 05/07/24 1415   heparin  injection 5,000 Units, 5,000 Units, Subcutaneous, Q8H, Hall, Carole N, DO, 5,000 Units at 05/07/24 1416   hydrALAZINE  (APRESOLINE ) injection 5 mg, 5 mg, Intravenous, Q6H PRN, Will Almarie MATSU, MD   insulin  aspart (novoLOG ) injection 0-5 Units, 0-5 Units, Subcutaneous, QHS, Hall, Carole N, DO, 2 Units at 05/06/24 2200   insulin  aspart (novoLOG ) injection 0-6 Units, 0-6 Units, Subcutaneous, TID WC, Hall, Carole N, DO, 1 Units at 05/07/24 1645   insulin  glargine (LANTUS ) injection 5 Units, 5 Units, Subcutaneous, Daily, Will Almarie MATSU, MD, 5 Units at 05/07/24 1645   lactulose  (CHRONULAC ) 10 GM/15ML solution 20 g, 20 g, Oral, BID, Will Almarie MATSU, MD, 20 g at 05/07/24 1006   levETIRAcetam  (KEPPRA ) tablet 750 mg, 750 mg, Oral, BID, Mathews, Elizabeth G, MD   melatonin tablet 5 mg, 5 mg, Oral, QHS PRN, Shona Terry N, DO, 5 mg at 05/03/24 2248   pantoprazole  (PROTONIX ) EC tablet 40 mg, 40 mg, Oral, Daily, Hall, Carole N, DO, 40 mg at 05/07/24 1000   polyethylene glycol (MIRALAX  / GLYCOLAX ) packet 17 g, 17 g, Oral, BID, Will Almarie MATSU, MD, 17 g at 05/07/24 1003  prochlorperazine  (COMPAZINE ) injection 5 mg, 5 mg, Intravenous, Q6H PRN, Hall, Carole N, DO   simvastatin  (ZOCOR ) tablet 40 mg, 40 mg, Oral, q1800, Shona Laurence N, DO, 40 mg at 05/06/24 1708

## 2024-05-05 NOTE — TOC Progression Note (Signed)
 Transition of Care [facility]) - Progression Note    Patient Details  Name: Sonya Solomon MRN: [id number] Date of Birth: 18-Jun-1945  [facility] CM/SW Contact  Sonda Manuella Quill, RN Phone Number: 05/05/2024, 8:54 AM  Clinical Narrative:    Not ready for CIR per their eval; IP CM following.   Expected Discharge Plan:  (TBD) Barriers to Discharge: Continued Medical Work up               Expected Discharge Plan and Services In-house Referral: NA Discharge Planning Services: CM Consult Post Acute Care Choice: Durable Medical Equipment Living arrangements for the past 2 months: Single Family Home                                       Social Drivers of Health (SDOH) Interventions SDOH Screenings   Food Insecurity: No Food Insecurity (05/01/2024)  Housing: Low Risk (05/01/2024)  Transportation Needs: No Transportation Needs (05/01/2024)  Utilities: Not At Risk (05/01/2024)  Social Connections: Moderately Integrated (05/01/2024)  Tobacco Use: Low Risk (04/30/2024)    Readmission Risk Interventions    05/03/2024    4:12 PM  Readmission Risk Prevention Plan  Transportation Screening Complete  PCP or Specialist Appt within 5-7 Days Complete  Home Care Screening Complete  Medication Review (RN CM) Complete

## 2024-05-06 ENCOUNTER — Inpatient Hospital Stay (HOSPITAL_COMMUNITY)

## 2024-05-06 DIAGNOSIS — I471 Supraventricular tachycardia, unspecified: Secondary | ICD-10-CM

## 2024-05-06 DIAGNOSIS — N179 Acute kidney failure, unspecified: Secondary | ICD-10-CM | POA: Diagnosis not present

## 2024-05-06 LAB — CULTURE, BLOOD (ROUTINE X 2)
Culture: NO GROWTH
Culture: NO GROWTH

## 2024-05-06 LAB — COMPREHENSIVE METABOLIC PANEL WITH GFR
ALT: 70 U/L — ABNORMAL HIGH (ref 0–44)
AST: 46 U/L — ABNORMAL HIGH (ref 15–41)
Albumin: 3.5 g/dL (ref 3.5–5.0)
Alkaline Phosphatase: 42 U/L (ref 38–126)
Anion gap: 12 (ref 5–15)
BUN: 53 mg/dL — ABNORMAL HIGH (ref 8–23)
CO2: 17 mmol/L — ABNORMAL LOW (ref 22–32)
Calcium: 9 mg/dL (ref 8.9–10.3)
Chloride: 110 mmol/L (ref 98–111)
Creatinine, Ser: 1.56 mg/dL — ABNORMAL HIGH (ref 0.44–1.00)
GFR, Estimated: 34 mL/min — ABNORMAL LOW
Glucose, Bld: 265 mg/dL — ABNORMAL HIGH (ref 70–99)
Potassium: 4.6 mmol/L (ref 3.5–5.1)
Sodium: 139 mmol/L (ref 135–145)
Total Bilirubin: 0.2 mg/dL (ref 0.0–1.2)
Total Protein: 6 g/dL — ABNORMAL LOW (ref 6.5–8.1)

## 2024-05-06 LAB — GLUCOSE, CAPILLARY
Glucose-Capillary: 212 mg/dL — ABNORMAL HIGH (ref 70–99)
Glucose-Capillary: 244 mg/dL — ABNORMAL HIGH (ref 70–99)
Glucose-Capillary: 192 mg/dL — ABNORMAL HIGH (ref 70–99)
Glucose-Capillary: 242 mg/dL — ABNORMAL HIGH (ref 70–99)

## 2024-05-06 LAB — CBC
HCT: 32.3 % — ABNORMAL LOW (ref 36.0–46.0)
Hemoglobin: 10.9 g/dL — ABNORMAL LOW (ref 12.0–15.0)
MCH: 30.8 pg (ref 26.0–34.0)
MCHC: 33.7 g/dL (ref 30.0–36.0)
MCV: 91.2 fL (ref 80.0–100.0)
Platelets: 239 K/uL (ref 150–400)
RBC: 3.54 MIL/uL — ABNORMAL LOW (ref 3.87–5.11)
RDW: 15.9 % — ABNORMAL HIGH (ref 11.5–15.5)
WBC: 9.6 K/uL (ref 4.0–10.5)
nRBC: 0 % (ref 0.0–0.2)

## 2024-05-06 LAB — MAGNESIUM: Magnesium: 2 mg/dL (ref 1.7–2.4)

## 2024-05-06 LAB — HIV ANTIBODY (ROUTINE TESTING W REFLEX): HIV Screen 4th Generation wRfx: NONREACTIVE

## 2024-05-06 LAB — SYPHILIS: RPR W/REFLEX TO RPR TITER AND TREPONEMAL ANTIBODIES, TRADITIONAL SCREENING AND DIAGNOSIS ALGORITHM: RPR Ser Ql: NONREACTIVE

## 2024-05-06 MED ORDER — DEXAMETHASONE SOD PHOSPHATE PF 10 MG/ML IJ SOLN
10.0000 mg | Freq: Once | INTRAMUSCULAR | Status: AC
  Administered 2024-05-06: 10 mg via INTRAVENOUS

## 2024-05-06 MED ORDER — GADOBUTROL 1 MMOL/ML IV SOLN
9.0000 mL | Freq: Once | INTRAVENOUS | Status: AC | PRN
  Administered 2024-05-06: 9 mL via INTRAVENOUS

## 2024-05-06 MED ORDER — FUROSEMIDE 10 MG/ML IJ SOLN
20.0000 mg | Freq: Once | INTRAMUSCULAR | Status: AC
  Administered 2024-05-06: 20 mg via INTRAVENOUS
  Filled 2024-05-06: qty 2

## 2024-05-06 MED ORDER — ALPRAZOLAM 0.5 MG PO TABS
0.5000 mg | ORAL_TABLET | Freq: Once | ORAL | Status: AC
  Administered 2024-05-06: 0.5 mg via ORAL
  Filled 2024-05-06: qty 1

## 2024-05-06 MED ORDER — DEXAMETHASONE 4 MG PO TABS
4.0000 mg | ORAL_TABLET | Freq: Three times a day (TID) | ORAL | Status: DC
  Administered 2024-05-06 – 2024-05-26 (×60): 4 mg via ORAL
  Filled 2024-05-06 (×61): qty 1

## 2024-05-06 MED ORDER — ALPRAZOLAM 0.25 MG PO TABS
0.2500 mg | ORAL_TABLET | Freq: Three times a day (TID) | ORAL | Status: DC | PRN
  Administered 2024-05-06 – 2024-05-26 (×32): 0.25 mg via ORAL
  Filled 2024-05-06 (×35): qty 1

## 2024-05-06 NOTE — Progress Notes (Addendum)
 " PROGRESS NOTE    Sonya Solomon  FMW:[id number] DOB: 08-22-1945 DOA: 04/30/2024 PCP: Leonel Cole, MD  Brief Narrative: 79 y.o. female with medical history significant for MS, type 2 diabetes, hyperlipidemia, hypertension, obesity, history of gait disturbance, vertigo, urinary urgency, spasticity, insomnia, who presents to the ER due to generalized weakness, dizziness, and a fall.  She was on the floor for about 4 hours before the husband could get help and get her off the floor.  She has been having diarrhea for few days prior to admission.  In the ED her systolic blood pressure was in the 70s diastolic in the 40s.  She was found to have an AKI on CKD stage III A with a creatinine of 5.4 and admitted for the same.  At baseline she walks with a walker and a cane independent with all ADLs but does not cook or clean the house.  She lives at home with her husband.   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 Decadron  for edema   AKI on CKD stage IIIa baseline creatinine is around 1.1.  She was found to have a creatinine of 5.4 on admission due to dehydration poor p.o. intake and diarrhea and hypotension.  She was given IV fluid boluses and admitted.   Creatinine is 1.56 from 2.87 from 4.2 from 5.4 on admission.  Renal ultrasound shows some evidence of medical renal disease with no obstruction.   Foley in place.  Hypotension resolving blood pressure 142/66 improving on midodrine  and Solu-Cortef  and IV fluids.  Patient was started on Decadron  05/06/2024 due to multiple lesions in her brain causing edema, Solu-Cortef  was stopped at this time.   Midodrine  is being tapered down.   E. coli UTI, POA  - Urine culture showing E. coli, sensitive to Rocephin . - Rocephin     Hyperkalemia - Resolved  Hypomagnesemia resolved after repletion  PVCs/NSVT SVT appreciate cardiology  Echo done during this admission shows severe LVH with a concern for HOCM question?   Demand ischemia - Troponin trend relatively flat 126, 141, 169 - Echocardiogram negative for regional wall motion abnormality, EF 70 to 75%, grade 1 diastolic dysfunction, elevated pulmonary artery systolic pressure, LV hypertrophy severe?  Question need for cardiac MRI cardiology consulted.  Appreciate their input.   Diarrhea - C. difficile negative - GI PCR panel ordered but has not had a bowel movement since admission.    Seizure disorder - Keppra , dose adjusted for renal function   MS - PT OT recommended SNF versus acute rehab acute rehab assessed the patient and does not think she is a candidate for acute rehab.   Hypertension - Lisinopril  on hold due to AKI   Diabetes mellitus - A1c 7.0 - Sliding scale insulin    Hyperlipidemia - Zocor     Estimated body mass index is 33.38 kg/m as calculated from the following:   Height as of this encounter: 5' 4.5 (1.638 m).   Weight as of this encounter: 89.6 kg.  DVT prophylaxis: Heparin   code Status: Full code  family Communication: with patient's husband on the phone  disposition Plan:  Status is: Inpatient Remains inpatient appropriate because: Persistent AKI   Consultants:  None  Procedures: None Antimicrobials:-Rocephin   Subjective:  C/o seeing double ct concerning for mets mri brain 43 lesions concerning for mets  Ct chest pending Dw oncology neuro   Objective: Vitals:   05/05/24 1958 05/05/24 1959 05/05/24 2001 05/06/24 0434  BP: 129/72   (!) 144/84  Pulse: (!) 47 94 (!) 57 (!) 56  Resp: 20   20  Temp: 97.9 F (36.6 C)   98.2 F (36.8 C)  TempSrc: Oral   Oral  SpO2: 96% 96% 97% 94%  Weight:       Height:        Intake/Output Summary (Last 24 hours) at 05/06/2024 1125 Last data filed at 05/06/2024 0550 Gross per 24 hour  Intake 561.25 ml  Output 975 ml  Net -413.75 ml   Filed Weights   04/30/24 2001 04/30/24 2015  Weight: 89.6 kg 89.6 kg    Examination:  General exam: Appears  anxious tearful Respiratory system: Clear to auscultation. Respiratory effort normal.  Diminished breath sounds Cardiovascular system:reg  Gastrointestinal system: Abdomen is nondistended, soft and nontender. No organomegaly or masses felt. Normal bowel sounds heard. Central nervous system: Alert and oriented. Extremities: 1+ bilateral feet edema peers to be chronic    Data Reviewed: I have personally reviewed following labs and imaging studies  CBC: Recent Labs  Lab 04/30/24 2034 05/01/24 0804 05/04/24 0626 05/05/24 0627  WBC 14.4* 10.2 7.0 8.8  NEUTROABS 11.9*  --   --   --   HGB 12.0 10.6* 9.2* 9.9*  HCT 35.9* 32.3* 27.0* 29.5*  MCV 90.4 92.0 90.3 91.0  PLT 258 232 203 225   Basic Metabolic Panel: Recent Labs  Lab 04/30/24 2034 05/01/24 0804 05/02/24 0251 05/03/24 0251 05/04/24 0626 05/05/24 0627  NA 130* 132* 133* 136 138 140  K 6.1* 5.1 4.9 4.6 4.5 4.2  CL 96* 103 107 112* 112* 114*  CO2 18* 15* 14* 14* 16* 16*  GLUCOSE 134* 101* 115* 190* 240* 184*  BUN 79* 69* 74* 73* 69* 67*  CREATININE 5.46* 5.40* 5.29* 4.29* 2.87* 2.15*  CALCIUM 12.5* 10.8* 10.4* 9.3 8.6* 8.8*  MG 1.9  --   --   --   --  1.4*   GFR: Estimated Creatinine Clearance: 24.4 mL/min (A) (by C-G formula based on SCr of 2.15 mg/dL (H)). Liver Function Tests: Recent Labs  Lab 04/30/24 0022 04/30/24 2034 05/05/24 0627  AST 29 35 23  ALT 16 18 25   ALKPHOS 35* 38 32*  BILITOT 0.3 0.4 <0.2  PROT 5.7* 6.3* 5.3*  ALBUMIN  3.6 4.0 3.3*   No results for input(s): LIPASE, AMYLASE in the last 168 hours. No results for input(s): AMMONIA in the last 168 hours. Coagulation Profile: No results for  input(s): INR, PROTIME in the last 168 hours. Cardiac Enzymes: Recent Labs  Lab 04/30/24 2034  CKTOTAL 201   BNP (last 3 results) No results for input(s): PROBNP in the last 8760 hours. HbA1C: No results for input(s): HGBA1C in the last 72 hours. CBG: Recent Labs  Lab 05/05/24 0709 05/05/24 1156 05/05/24 1706 05/05/24 2125 05/06/24 0744  GLUCAP 175* 289* 206* 181* 212*   Lipid Profile: No results for input(s): CHOL, HDL, LDLCALC, TRIG, CHOLHDL, LDLDIRECT in the last 72 hours. Thyroid  Function Tests: No results for input(s): TSH, T4TOTAL, FREET4, T3FREE, THYROIDAB in the last 72 hours. Anemia Panel: No results for input(s): VITAMINB12, FOLATE, FERRITIN, TIBC, IRON, RETICCTPCT  in the last 72 hours. Sepsis Labs: No results for input(s): PROCALCITON, LATICACIDVEN in the last 168 hours.  Recent Results (from the past 240 hours)  Urine Culture     Status: Abnormal   Collection Time: 04/30/24  8:24 PM   Specimen: Urine, Random  Result Value Ref Range Status   Specimen Description   Final    URINE, RANDOM Performed at [facility], [address], [place], [place] [address]    Special Requests   Final    NONE Reflexed from [id number] Performed at [facility], [address], [place], [place] [address]    Culture >=100,000 COLONIES/mL ESCHERICHIA COLI (A)  Final   Report Status 05/03/2024 FINAL  Final   Organism ID, Bacteria ESCHERICHIA COLI (A)  Final      Susceptibility   Escherichia coli - MIC*    AMPICILLIN >=32 RESISTANT Resistant     CEFAZOLIN (URINE) Value in next row Sensitive      8 SENSITIVEThis is a modified FDA-approved test that has been validated and its performance characteristics determined by the reporting laboratory.  This laboratory is certified under the Clinical Laboratory Improvement Amendments CLIA as qualified to perform high complexity clinical laboratory testing.     CEFEPIME Value in next row Sensitive      8 SENSITIVEThis is a modified FDA-approved test that has been validated and its performance characteristics determined by the reporting laboratory.  This laboratory is certified under the Clinical Laboratory Improvement Amendments CLIA as qualified to perform high complexity clinical laboratory testing.    ERTAPENEM Value in next row Sensitive      8 SENSITIVEThis is a modified FDA-approved test that has been validated and its performance characteristics determined by the reporting laboratory.  This laboratory is certified under the Clinical Laboratory Improvement Amendments CLIA as qualified to perform high complexity clinical laboratory testing.    CEFTRIAXONE  Value in next row Sensitive      8 SENSITIVEThis is a modified FDA-approved test that has been validated and its performance characteristics determined by the reporting laboratory.  This laboratory is certified under the Clinical Laboratory Improvement Amendments CLIA as qualified to perform high complexity clinical laboratory testing.    CIPROFLOXACIN Value in next row Sensitive      8 SENSITIVEThis is a modified FDA-approved test that has been validated and its performance characteristics determined by the reporting laboratory.  This laboratory is certified under the Clinical Laboratory Improvement Amendments CLIA as qualified to perform high complexity clinical laboratory testing.    GENTAMICIN Value in next row Sensitive      8 SENSITIVEThis is a modified FDA-approved test that has been validated and its performance characteristics determined by the reporting laboratory.  This laboratory is certified under the Clinical Laboratory Improvement Amendments CLIA as qualified to perform high complexity clinical laboratory testing.    NITROFURANTOIN Value in next row Sensitive      8 SENSITIVEThis is a modified FDA-approved test that has been validated and its performance characteristics determined by the  reporting laboratory.  This laboratory is certified under the Clinical Laboratory Improvement Amendments CLIA as qualified to perform high complexity clinical laboratory testing.    TRIMETH/SULFA Value in next row Sensitive      8 SENSITIVEThis is a modified FDA-approved test that has been validated and its performance characteristics determined by the reporting laboratory.  This laboratory is certified under the Clinical Laboratory Improvement Amendments CLIA as qualified to perform high complexity clinical laboratory testing.  AMPICILLIN/SULBACTAM Value in next row Resistant      8 SENSITIVEThis is a modified FDA-approved test that has been validated and its performance characteristics determined by the reporting laboratory.  This laboratory is certified under the Clinical Laboratory Improvement Amendments CLIA as qualified to perform high complexity clinical laboratory testing.    PIP/TAZO Value in next row Sensitive      <=4 SENSITIVEThis is a modified FDA-approved test that has been validated and its performance characteristics determined by the reporting laboratory.  This laboratory is certified under the Clinical Laboratory Improvement Amendments CLIA as qualified to perform high complexity clinical laboratory testing.    MEROPENEM Value in next row Sensitive      <=4 SENSITIVEThis is a modified FDA-approved test that has been validated and its performance characteristics determined by the reporting laboratory.  This laboratory is certified under the Clinical Laboratory Improvement Amendments CLIA as qualified to perform high complexity clinical laboratory testing.    * >=100,000 COLONIES/mL ESCHERICHIA COLI  C Difficile Quick Screen w PCR reflex     Status: None   Collection Time: 04/30/24 10:05 PM   Specimen: STOOL  Result Value Ref Range Status   C Diff antigen NEGATIVE NEGATIVE Final   C Diff toxin NEGATIVE NEGATIVE Final   C Diff interpretation No C. difficile detected.  Final     Comment: Performed at [facility], [address] [place], [place] [address]  Blood culture (routine x 2)     Status: None   Collection Time: 05/01/24  4:00 AM   Specimen: BLOOD  Result Value Ref Range Status   Specimen Description   Final    BLOOD BLOOD RIGHT ARM Performed at [facility], [address] [place], [place] [address]    Special Requests   Final    BOTTLES DRAWN AEROBIC ONLY Blood Culture results may not be optimal due to an inadequate volume of blood received in culture bottles Performed at [facility], [address], [place], [place] [address]    Culture   Final    NO GROWTH 5 DAYS Performed at [facility], [address] [place], [place] [address]    Report Status 05/06/2024 FINAL  Final  Blood culture (routine x 2)     Status: None   Collection Time: 05/01/24  4:04 AM   Specimen: BLOOD  Result Value Ref Range Status   Specimen Description   Final    BLOOD LEFT ANTECUBITAL Performed at [place], [address], [place], [place] [address]    Special Requests   Final    BOTTLES DRAWN AEROBIC AND ANAEROBIC Blood Culture results may not be optimal due to an inadequate volume of blood received in culture bottles Performed at [facility], [address], [place], [place] [address]    Culture   Final    NO GROWTH 5 DAYS Performed at [facility], [address], [place], [place] [address]    Report Status 05/06/2024 FINAL  Final         Radiology Studies: MR BRAIN W WO CONTRAST Result Date: 05/06/2024 EXAM: MRI BRAIN WITH AND WITHOUT CONTRAST 05/06/2024 08:57:23 AM TECHNIQUE: Multiplanar multisequence MRI of the head/brain was performed with and without the administration of 9 mL gadobutrol  (GADAVIST ) 1 MMOL/ML injection. COMPARISON: MRI of the head dated 04/20/2020 and CT of the head dated 05/05/2024. CLINICAL HISTORY: Diplopia. FINDINGS: BRAIN  AND VENTRICLES: No acute infarct. No  acute intracranial hemorrhage. No mass effect or midline shift. No hydrocephalus. The sella is unremarkable. Normal flow voids. There are numerous enhancing nodules and ring enhancing lesions within the cerebral and cerebellar hemispheres bilaterally, as demonstrated on the previous CT, which are highly suspicious for metastatic disease. The largest lesion is situated within the left parietal lobe, measuring approximately 3.2 x 2.0 x 3.6 cm. There are approximately 45 enhancing lesions within the cerebral and cerebellar hemispheres. There is a lesion within the left thalamus and left cerebral peduncle seen on image 16 of series 18, measuring approximately 14 x 11 x 14 mm. There is also a lesion within the left basal ganglia measuring approximately 15 x 12 x 11 mm. None of the lesions demonstrate restricted diffusion. There is extensive cerebral white matter disease present. ORBITS: No acute abnormality. SINUSES: No acute abnormality. BONES AND SOFT TISSUES: Normal bone marrow signal and enhancement. No acute soft tissue abnormality. IMPRESSION: 1. Numerous (approximately 45) enhancing nodules and ring enhancing lesions within the cerebral and cerebellar hemispheres bilaterally, highly suspicious for metastatic disease, with the largest lesion in the left parietal lobe measuring approximately 3.2 x 2.0 x 3.6 cm. 2. Extensive cerebral white matter disease. Electronically signed by: Evalene Coho MD 05/06/2024 10:15 AM EST RP Workstation: [id number]   CT HEAD WO CONTRAST ( ) Result Date: 05/05/2024 EXAM: CT HEAD WITHOUT CONTRAST 05/05/2024 08:11:00 PM TECHNIQUE: CT of the head was performed without the administration of intravenous contrast. Automated exposure control, iterative reconstruction, and/or weight based adjustment of the mA/kV was utilized to reduce the radiation dose to as low as reasonably achievable. COMPARISON: MRI head 04/20/2020. CLINICAL HISTORY: Diplopia;  Headache, sudden, severe. FINDINGS: BRAIN AND VENTRICLES: No acute hemorrhage. No evidence of acute infarct. No hydrocephalus. No extra-axial collection. No midline shift. Local mass effect is present associated with the dominant left parietal lesion. Multiple somewhat heterogeneous hyperattenuating lesions throughout the cerebral hemispheres concerning for intracranial metastatic disease. Dominant lesion in the high left parietal lobe near the vertex measures 2.6 x 2.1 x 2.9 cm. There is surrounding vasogenic edema and local mass effect. Additional 1.2 cm lesion in the right occipital lobe (series 2, image 14). 1.0 cm lesion in the left cerebral peduncle with edema in the midbrain also extending into the left thalamus. There is a 1.4 cm hyperattenuating lesion in the right parietal lobe with possible adjacent satellite lesion and associated vasogenic edema. Additional lesions noted in the left frontal lobe and in the left basal ganglia. There are additional areas of hypoattenuation in the supratentorial white matter which may reflect a combination of chronic microvascular disease as well as additional areas of vasogenic edema. MRI of the brain with and without contrast is recommended for further evaluation of intracranial lesions. ORBITS: Bilateral lens replacement noted. SINUSES: No acute abnormality. SOFT TISSUES AND SKULL: No acute soft tissue abnormality. No skull fracture. IMPRESSION: 1. 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 Associated vasogenic edema and local mass effect. No midline shift. 2. MRI of the brain with and without contrast is recommended for further evaluation of the intracranial lesions. Electronically signed by: Donnice Mania MD 05/05/2024 08:25 PM EST RP Workstation: [id number]     Scheduled Meds:  Chlorhexidine  Gluconate Cloth  6 each Topical Daily   desmopressin   0.1 mg Oral QHS   feeding supplement   237 mL Oral BID BM   heparin   5,000 Units Subcutaneous Q8H   insulin  aspart  0-5 Units Subcutaneous QHS  insulin  aspart  0-6 Units Subcutaneous TID WC   levETIRAcetam   500 mg Oral BID   midodrine   5 mg Oral TID WC   pantoprazole   40 mg Oral Daily   polyethylene glycol  17 g Oral BID   simvastatin   40 mg Oral q1800   Continuous Infusions:  sodium chloride  75 mL/hr at 05/06/24 0247     LOS: 6 days    Almarie KANDICE Hoots, MD 05/06/2024, 11:25 AM   "

## 2024-05-06 NOTE — Plan of Care (Signed)
" °  Problem: Coping: Goal: Ability to adjust to condition or change in health will improve Outcome: Progressing   Problem: Education: Goal: Knowledge of General Education information will improve Description: Including pain rating scale, medication(s)/side effects and non-pharmacologic comfort measures Outcome: Progressing   Problem: Clinical Measurements: Goal: Diagnostic test results will improve Outcome: Progressing   Problem: Activity: Goal: Risk for activity intolerance will decrease Outcome: Progressing   "

## 2024-05-06 NOTE — Plan of Care (Signed)

## 2024-05-06 NOTE — Progress Notes (Signed)
" ° ° ° °  Patient Name: Sonya Solomon           DOB: [birth year]  MRN: [id number]      Admission Date: 04/30/2024  Attending Provider: Will Almarie MATSU, MD  Primary Diagnosis: AKI (acute kidney injury)   Level of care: Telemetry   OVERNIGHT EVENT   HPI/ Events of Note  ROZELLE CAUDLE, 79 y.o. female with medical history significant for MS, type 2 DM, HLD, HTN, obesity, gait disturbance, vertigo, urinary urgency, spasticity, insomnia, presented to the ER due to generalized weakness, dizziness, and a fall.  I was informed earlier tonight that the patient is complaining of new vision problems described as double vision.  This has been ongoing for 2 days or more. STAT head CT ordered by attending MD.    Bedside Assessment:  During bedside assessment, she is A/O x 4 and is hemodynamically stable, with no acute distress. Denies dizziness, lightheadedness, ocular pain,  N/V, has Foley in place. Endorses blurry double vision.  PERRLA, extraocular movement intact, presence of binocular diplopia. Facial symmetry and sensation intact.  Clear speech.  BUE strength 5/5 and sensation intact.  She reports chronic BLE numbness and weakness, right> left .  Only able to lift left leg 1 inch off the bed.    Head CT:  IMPRESSION:  1. 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 Associated vasogenic edema and local mass effect. No midline shift. 2. MRI of the brain with and without contrast is recommended for further evaluation of the intracranial lesions.   Plan: MRI of the brain with and without contrast  IV decadron   Consult with Neuro/ NS after MRI   Lavanda Horns, DNP, ACNPC- AG [facility]           "

## 2024-05-06 NOTE — Progress Notes (Signed)
 This shift, during bedside report pt c/o blurry vision for the second day now. She states she didn't share this information with anyone because 'there is just so much wrong with me . Outgoing RN, Lauraine, initiated contact with attending regarding these new symptoms. New orders placed including STAT CT. Plan of care ongoing.

## 2024-05-06 NOTE — Consult Note (Signed)
 NEUROLOGY CONSULT NOTE   Date of service: May 06, 2024 Patient Name: Sonya Solomon MRN:  [id number] DOB:  June 28, 1945 Chief Complaint: Diplopia and blurry vision Requesting Provider: Will Almarie MATSU, MD  History of Present Illness  Sonya Solomon is a 79 y.o. female with hx of MS, diabetes, hypertension and hyperlipidemia who originally presented with generalized weakness, dizziness and a fall at home.  Patient complained of diplopia which she was unable to describe but stated that her vision was blurry as well which occurred 2 days ago and then resolved spontaneously.  Initial head CT demonstrated multiple hyperattenuating lesions consistent with metastatic disease with vasogenic edema and mass effect.   Past History   Past Medical History:  Diagnosis Date   Diabetes mellitus without complication (HCC)    MS (multiple sclerosis)     Past Surgical History:  Procedure Laterality Date   ABDOMINAL HYSTERECTOMY     APPENDECTOMY     BOWEL RESECTION  1996   BREAST REDUCTION SURGERY  07/10/1986   CATARACT EXTRACTION, BILATERAL     12/21/19, 09/19.21   CHOLECYSTECTOMY  05/2004   DE QUERVAIN'S RELEASE  05/18/2014   RADIAL STYLOIDECTOMY WRIST Left 05/10/2012   REDUCTION MAMMAPLASTY     TRIGGER FINGER RELEASE Left 01/09/2011   UMBILICAL HERNIA REPAIR  08/10/2006    Family History: Family History  Problem Relation Age of Onset   Breast cancer Mother    Heart attack Father     Social History  reports that she has never smoked. She has never used smokeless tobacco. She reports that she does not drink alcohol and does not use drugs.  Allergies[1]  Medications  Current Medications[2]  Vitals   Vitals:   05/05/24 1959 05/05/24 2001 05/06/24 0434 05/06/24 1304  BP:   (!) 144/84 (!) 144/55  Pulse: 94 (!) 57 (!) 56 77  Resp:   20 17  Temp:   98.2 F (36.8 C) 97.8 F (36.6 C)  TempSrc:   Oral Axillary  SpO2: 96% 97% 94% 96%  Weight:      Height:        Body mass  index is 33.38 kg/m.   Physical Exam   Constitutional: Appears well-developed and well-nourished.  Psych: Affect appropriate to situation.  Eyes: No scleral injection.  HENT: No OP obstruction.  Head: Normocephalic.  Respiratory: Effort normal, non-labored breathing.  Skin: WDI.   Neurologic Examination    NEURO:  Mental Status: AA&Ox3, able to discuss present illness clearly Speech/Language: speech is without dysarthria or aphasia.    Cranial Nerves:  II: PERRL.  III, IV, VI: EOMI. Eyelids elevate symmetrically.  VII: Face is symmetrical resting and speaking VIII: hearing intact to voice. IX, X: Phonation is normal.  Motor: Able to move all 4 extremities, antigravity strength noted in bilateral upper extremities Tone: is normal and bulk is normal Sensation- Intact to light touch bilaterally.  Gait- deferred    Labs/Imaging/Neurodiagnostic studies   CBC:  Recent Labs  Lab 05/12/2024 2034 05/01/24 0804 05/04/24 0626 05/05/24 0627  WBC 14.4*   < > 7.0 8.8  NEUTROABS 11.9*  --   --   --   HGB 12.0   < > 9.2* 9.9*  HCT 35.9*   < > 27.0* 29.5*  MCV 90.4   < > 90.3 91.0  PLT 258   < > 203 225   < > = values in this interval not displayed.   Basic Metabolic Panel:  Lab Results  Component Value  Date   NA 140 05/05/2024   K 4.2 05/05/2024   CO2 16 (L) 05/05/2024   GLUCOSE 184 (H) 05/05/2024   BUN 67 (H) 05/05/2024   CREATININE 2.15 (H) 05/05/2024   CALCIUM 8.8 (L) 05/05/2024   GFRNONAA 23 (L) 05/05/2024   GFRAA 56 (L) 08/26/2020   Lipid Panel:  Lab Results  Component Value Date   LDLCALC 39 08/26/2020   HgbA1c:  Lab Results  Component Value Date   HGBA1C 7.0 (H) 04/30/2024    CT Head without contrast(Personally reviewed): Multiple hyperattenuating intracranial lesions throughout cerebral hemispheres most consistent with metastatic disease with vasogenic edema and mass effect  MRI Brain(Personally reviewed): Approximately 45 enhancing nodules and  ring-enhancing lesions within cerebral and cerebellar hemispheres bilaterally highly suspicious for metastatic disease, extensive white matter disease   ASSESSMENT   Sonya Solomon is a 79 y.o. female with hx of MS, diabetes, hypertension and hyperlipidemia who originally presented with generalized weakness, dizziness and a fall at home.  While at the hospital, she complained of blurry vision and diplopia which were present for about 2 days and resolved spontaneously.  Patient was unable to give a good description of whether the diplopia was horizontal or vertical.  Initial head CT demonstrated multiple hyperattenuating lesions suspicious for metastatic disease, and MRI brain demonstrates about 45 enhancing nodules and ring-enhancing lesions.  Decadron  has been administered to decrease vasogenic edema associated with these lesions, and patient will need oncology consult for diagnosis and discussion of treatment options.  RECOMMENDATIONS  -Decadron  10 mg x 1 and 4 mg every 6 hours thereafter - Oncology consult - Neurology will continue to follow ______________________________________________________________________  Patient seen by NP and then by MD, MD to edit note as needed.  Signed, Cortney E Everitt Clint Kill, NP [facility]     [1]  Allergies Allergen Reactions   Latex Rash  [2]  Current Facility-Administered Medications:    0.9 %  sodium chloride  infusion, , Intravenous, Continuous, Will Almarie MATSU, MD, Last Rate: 75 mL/hr at 05/06/24 0247, New Bag at 05/06/24 0247   acetaminophen  (TYLENOL ) tablet 500 mg, 500 mg, Oral, Q6H PRN, Shona Terry SAILOR, DO, 500 mg at 05/01/24 9066   Chlorhexidine  Gluconate Cloth 2 % PADS 6 each, 6 each, Topical, Daily, Rojelio Nest, DO, 6 each at 05/06/24 9063   desmopressin  (DDAVP ) tablet 0.1 mg, 0.1 mg, Oral, QHS, Will Almarie MATSU, MD, 0.1 mg at 05/05/24 2248   docusate sodium  (COLACE) capsule 100 mg, 100 mg, Oral, BID PRN, Will Almarie MATSU, MD, 100 mg at 05/05/24 1757   feeding supplement (ENSURE PLUS HIGH PROTEIN) liquid 237 mL, 237 mL, Oral, BID BM, Rojelio Nest, DO, 237 mL at 05/06/24 1459   heparin  injection 5,000 Units, 5,000 Units, Subcutaneous, Q8H, Hall, Carole N, DO, 5,000 Units at 05/06/24 1458   insulin  aspart (novoLOG ) injection 0-5 Units, 0-5 Units, Subcutaneous, QHS, Hall, Carole N, DO, 2 Units at 05/04/24 2257   insulin  aspart (novoLOG ) injection 0-6 Units, 0-6 Units, Subcutaneous, TID WC, Hall, Carole N, DO, 2 Units at 05/06/24 1459   levETIRAcetam  (KEPPRA ) tablet 500 mg, 500 mg, Oral, BID, Will Almarie MATSU, MD, 500 mg at 05/06/24 0932   melatonin tablet 5 mg, 5 mg, Oral, QHS PRN, Shona Terry N, DO, 5 mg at 05/03/24 2248   midodrine  (PROAMATINE ) tablet 5 mg, 5 mg, Oral, TID WC, Will Almarie MATSU, MD, 5 mg at 05/06/24 1500   pantoprazole  (PROTONIX ) EC tablet 40 mg, 40 mg, Oral,  Daily, Shona Laurence N, DO, 40 mg at 05/06/24 0932   polyethylene glycol (MIRALAX  / GLYCOLAX ) packet 17 g, 17 g, Oral, BID, Will Almarie MATSU, MD, 17 g at 05/06/24 9068   prochlorperazine  (COMPAZINE ) injection 5 mg, 5 mg, Intravenous, Q6H PRN, Shona Laurence N, DO   simvastatin  (ZOCOR ) tablet 40 mg, 40 mg, Oral, q1800, Shona Laurence N, DO, 40 mg at 05/05/24 1725

## 2024-05-06 NOTE — Consult Note (Signed)
 "  Cardiology Consultation   Patient ID: Sonya Solomon MRN: [id number]; DOB: 29-Aug-1945  Admit date: 04/30/2024 Date of Consult: 05/06/2024  PCP:  Leonel Cole, MD   [facility] Providers Cardiologist:  None        Patient Profile: Sonya Solomon is a 79 y.o. female with a hx of multiple sclerosis, HTN, HLD, and DM2 who is being seen 05/06/2024 for the evaluation of possible HCM and NSVT at the request of Dr. Will  History of Present Illness: Ms. Zeoli was admitted to general medicine on 12/28 after suffering a fall in the setting of generalized weakness.  Patient shares to me that she was ambulating with her walker as she typically does and suffered a mechanical fall.  She was on the ground for roughly 4 hours and her husband was unable to get her up thus EMS was called.  Upon EMS arrival she was found to be hypotensive with systolics in the 70s and was treated with IV fluid resuscitation.  The patient shared with me that she had not been eating and drinking for some period of time as well as taking frequent laxatives thus was dehydrated.  On admission she was found to have a prerenal AKI, hyperkalemia, and acidosis.  Urine cultures were positive for E. coli.  She is currently being treated for her hypovolemia, AKI, metabolic derangements and UTI, and underwent a complete echocardiogram as a part of her evaluation as well.  Her echocardiogram demonstrated apical hypertrophy prompting cardiology referral.  She is also noted to have NSVT on telemetry.  CT head was performed which showed multiple lesions in the brain concerning for metastatic disease.  Brain MRI is pending.  The patient shares to me that she has never had any cardiovascular issues apart from HTN and HLD.  She has never had syncope or been told that she has anything wrong with her heart; however, she has been told that she has an  abnormal EKG her entire life.  She denies chest pain, SOB, PND, orthopnea, swelling,  syncope, palpitations.  No further concerns.   Past Medical History:  Diagnosis Date   Diabetes mellitus without complication (HCC)    MS (multiple sclerosis)     Past Surgical History:  Procedure Laterality Date   ABDOMINAL HYSTERECTOMY     APPENDECTOMY     BOWEL RESECTION  1996   BREAST REDUCTION SURGERY  07/10/1986   CATARACT EXTRACTION, BILATERAL     12/21/19, 09/19.21   CHOLECYSTECTOMY  05/2004   DE QUERVAIN'S RELEASE  05/18/2014   RADIAL STYLOIDECTOMY WRIST Left 05/10/2012   REDUCTION MAMMAPLASTY     TRIGGER FINGER RELEASE Left 01/09/2011   UMBILICAL HERNIA REPAIR  08/10/2006     Home Medications:  Prior to Admission medications  Medication Sig Start Date End Date Taking? Authorizing Provider  HYDROcodone -acetaminophen  (NORCO) 7.5-325 MG tablet Take 0.5-1 tablets by mouth 2 (two) times daily as needed. 04/14/24  Yes Sater, Charlie LABOR, MD  levETIRAcetam  (KEPPRA ) 750 MG tablet Take 1 tablet (750 mg total) by mouth 2 (two) times daily. 03/02/24  Yes Sater, Charlie LABOR, MD  lisinopril  (ZESTRIL ) 10 MG tablet TAKE 1 TABLET(10 MG) BY MOUTH DAILY 07/13/22  Yes Duanne Butler DASEN, MD  loperamide  (IMODIUM ) 2 MG capsule Take 1 capsule (2 mg total) by mouth as needed for diarrhea or loose stools. 05/10/20  Yes [place], Theodoro FALCON, MD  metFORMIN  (GLUCOPHAGE ) 1000 MG tablet TAKE 1 TABLET(1000 MG) BY MOUTH TWICE DAILY WITH A MEAL 04/29/21  Yes Chandra Raisin A, NP  omeprazole (PRILOSEC) 20 MG capsule Take 20 mg by mouth daily. 07/07/21  Yes [provider]  pioglitazone  (ACTOS ) 45 MG tablet Take 1 tablet (45 mg total) by mouth daily. 01/27/21  Yes Chandra Raisin A, NP  Semaglutide , 1 MG/DOSE, (OZEMPIC , 1 MG/DOSE,) 2 MG/1.5ML SOPN Inject 1 mg into the skin once a week. 08/05/20  Yes Chandra Raisin LABOR, NP  simvastatin  (ZOCOR ) 40 MG tablet TAKE 1 TABLET(40 MG) BY MOUTH DAILY 08/11/21  Yes Duanne Butler DASEN, MD  tiZANidine  (ZANAFLEX ) 2 MG tablet Take 1 tablet (2 mg total) by mouth every 8  (eight) hours as needed for muscle spasms. 12/30/23  Yes Sater, Charlie LABOR, MD  desmopressin  (DDAVP ) 0.1 MG tablet Take 1 tablet (0.1 mg total) by mouth at bedtime. 06/01/23   Sater, Charlie LABOR, MD  doxepin  (SINEQUAN ) 10 MG capsule Take 1 capsule (10 mg total) by mouth at bedtime. 12/09/23   Sater, Charlie LABOR, MD  LORazepam (ATIVAN) 1 MG tablet Take 1 mg by mouth every 3 (three) days. Patient not taking: Reported on 04/30/2024 02/04/24   [provider]    Scheduled Meds:  Chlorhexidine  Gluconate Cloth  6 each Topical Daily   desmopressin   0.1 mg Oral QHS   feeding supplement  237 mL Oral BID BM   heparin   5,000 Units Subcutaneous Q8H   insulin  aspart  0-5 Units Subcutaneous QHS   insulin  aspart  0-6 Units Subcutaneous TID WC   levETIRAcetam   500 mg Oral BID   midodrine   5 mg Oral TID WC   pantoprazole   40 mg Oral Daily   polyethylene glycol  17 g Oral BID   simvastatin   40 mg Oral q1800   Continuous Infusions:  sodium chloride  75 mL/hr at 05/06/24 0247   PRN Meds: acetaminophen , docusate sodium , melatonin, prochlorperazine   Allergies:   Allergies[1]  Social History:   Social History   Socioeconomic History   Marital status: Married    Spouse name: Not on file   Number of children: Not on file   Years of education: Not on file   Highest education level: Not on file  Occupational History   Not on file  Tobacco Use   Smoking status: Never   Smokeless tobacco: Never  Vaping Use   Vaping status: Never Used  Substance and Sexual Activity   Alcohol use: Never   Drug use: Never   Sexual activity: Not Currently  Other Topics Concern   Not on file  Social History Narrative   Lives with husband   Caffeine use: 2 cups per day   Right handed    Social Drivers of Health   Tobacco Use: Low Risk (04/30/2024)   Patient History    Smoking Tobacco Use: Never    Smokeless Tobacco Use: Never    Passive Exposure: Not on file  Financial Resource Strain: Not on file  Food  Insecurity: No Food Insecurity (05/01/2024)   Epic    Worried About Programme Researcher, Broadcasting/film/video in the Last Year: Never true    Ran Out of Food in the Last Year: Never true  Transportation Needs: No Transportation Needs (05/01/2024)   Epic    Lack of Transportation (Medical): No    Lack of Transportation (Non-Medical): No  Physical Activity: Not on file  Stress: Not on file  Social Connections: Moderately Integrated (05/01/2024)   Social Connection and Isolation Panel    Frequency of Communication with Friends and Family: More than three  times a week    Frequency of Social Gatherings with Friends and Family: Never    Attends Religious Services: Never    Database Administrator or Organizations: Yes    Attends Banker Meetings: Never    Marital Status: Married  Catering Manager Violence: Not At Risk (05/01/2024)   Epic    Fear of Current or Ex-Partner: No    Emotionally Abused: No    Physically Abused: No    Sexually Abused: No  Depression (PHQ2-9): Not on file  Alcohol Screen: Not on file  Housing: Low Risk (05/01/2024)   Epic    Unable to Pay for Housing in the Last Year: No    Number of Times Moved in the Last Year: 0    Homeless in the Last Year: No  Utilities: Not At Risk (05/01/2024)   Epic    Threatened with loss of utilities: No  Health Literacy: Not on file    Family History:    Family History  Problem Relation Age of Onset   Breast cancer Mother    Heart attack Father      ROS:  Please see the history of present illness.  All other ROS reviewed and negative.     Physical Exam/Data: Vitals:   05/05/24 1958 05/05/24 1959 05/05/24 2001 05/06/24 0434  BP: 129/72   (!) 144/84  Pulse: (!) 47 94 (!) 57 (!) 56  Resp: 20   20  Temp: 97.9 F (36.6 C)   98.2 F (36.8 C)  TempSrc: Oral   Oral  SpO2: 96% 96% 97% 94%  Weight:      Height:        Intake/Output Summary (Last 24 hours) at 05/06/2024 1013 Last data filed at 05/06/2024 0550 Gross per 24 hour   Intake 561.25 ml  Output 975 ml  Net -413.75 ml      04/30/2024    8:15 PM 04/30/2024    8:01 PM 12/09/2023    2:47 PM  Last 3 Weights  Weight (lbs) 197 lb 8.5 oz 197 lb 8.5 oz 197 lb 8 oz  Weight (kg) 89.6 kg 89.6 kg 89.585 kg     Body mass index is 33.38 kg/m.  General:  Well nourished, well developed, in no acute distress, very pleasant HEENT: normal Neck: no JVD Vascular: No carotid bruits; Distal pulses 2+ bilaterally Cardiac:  normal S1, S2; RRR; no murmur  Lungs:  clear to auscultation bilaterally, no wheezing, rhonchi or rales  Abd: soft, nontender, no hepatomegaly  Ext: no edema Musculoskeletal:  No gross deformities but reported decreased strength in lower extremities Skin: warm and dry  Neuro: No gross deformities Psych:  Normal affect   EKG:  The EKG was personally reviewed and demonstrates: NSR, frequent PACs Telemetry:  Telemetry was personally reviewed and demonstrates: NSR, occasional PSVT, occasional NSVT  Relevant CV Studies:   TTE 05/01/24:  IMPRESSIONS     1. Significant LV hypertrophy, especially apically. Moderate dynamic LVOT  gradient can be seen in HOCM. CMR may be helpful for further evaluation.  Left ventricular ejection fraction, by estimation, is 70 to 75%. The left  ventricle has hyperdynamic  function. The left ventricle has no regional wall motion abnormalities.  There is moderate concentric left ventricular hypertrophy. Left  ventricular diastolic parameters are consistent with Grade I diastolic  dysfunction (impaired relaxation).   2. Right ventricular systolic function is normal. The right ventricular  size is normal. Mildly increased right ventricular wall thickness. There  is severely elevated pulmonary artery systolic pressure. The estimated  right ventricular systolic pressure  is 68.0 mmHg.   3. Left atrial size was mildly dilated.   4. The mitral valve is normal in structure. Mild mitral valve  regurgitation. No evidence  of mitral stenosis.   5. The aortic valve is normal in structure. Aortic valve regurgitation is  not visualized. Mild aortic valve stenosis. Aortic valve Vmax measures  2.24 m/s.   6. The inferior vena cava is normal in size with greater than 50%  respiratory variability, suggesting right atrial pressure of 3 mmHg.   Laboratory Data: High Sensitivity Troponin:  No results for input(s): TROPONINIHS in the last 720 hours.  Recent Labs  Lab 04/30/24 2034 05/01/24 0141 05/01/24 0404 05/01/24 1801 05/05/24 1421  TRNPT 126* 141* 169* 158* 55*      Chemistry Recent Labs  Lab 04/30/24 2034 05/01/24 0804 05/03/24 0251 05/04/24 0626 05/05/24 0627  NA 130*   < > 136 138 140  K 6.1*   < > 4.6 4.5 4.2  CL 96*   < > 112* 112* 114*  CO2 18*   < > 14* 16* 16*  GLUCOSE 134*   < > 190* 240* 184*  BUN 79*   < > 73* 69* 67*  CREATININE 5.46*   < > 4.29* 2.87* 2.15*  CALCIUM 12.5*   < > 9.3 8.6* 8.8*  MG 1.9  --   --   --  1.4*  GFRNONAA 8*   < > 10* 16* 23*  ANIONGAP 16*   < > 10 10 11    < > = values in this interval not displayed.    Recent Labs  Lab 04/30/24 0022 04/30/24 2034 05/05/24 0627  PROT 5.7* 6.3* 5.3*  ALBUMIN  3.6 4.0 3.3*  AST 29 35 23  ALT 16 18 25   ALKPHOS 35* 38 32*  BILITOT 0.3 0.4 <0.2   Lipids No results for input(s): CHOL, TRIG, HDL, LABVLDL, LDLCALC, CHOLHDL in the last 168 hours.  Hematology Recent Labs  Lab 05/01/24 0804 05/04/24 0626 05/05/24 0627  WBC 10.2 7.0 8.8  RBC 3.51* 2.99* 3.24*  HGB 10.6* 9.2* 9.9*  HCT 32.3* 27.0* 29.5*  MCV 92.0 90.3 91.0  MCH 30.2 30.8 30.6  MCHC 32.8 34.1 33.6  RDW 14.2 15.4 15.6*  PLT 232 203 225   Thyroid  No results for input(s): TSH, FREET4 in the last 168 hours.  BNPNo results for input(s): BNP, PROBNP in the last 168 hours.  DDimer No results for input(s): DDIMER in the last 168 hours.  Radiology/Studies:  CT HEAD WO CONTRAST ( ) Result Date: 05/05/2024 EXAM: CT HEAD WITHOUT  CONTRAST 05/05/2024 08:11:00 PM TECHNIQUE: CT of the head was performed without the administration of intravenous contrast. Automated exposure control, iterative reconstruction, and/or weight based adjustment of the mA/kV was utilized to reduce the radiation dose to as low as reasonably achievable. COMPARISON: MRI head 04/20/2020. CLINICAL HISTORY: Diplopia; Headache, sudden, severe. FINDINGS: BRAIN AND VENTRICLES: No acute hemorrhage. No evidence of acute infarct. No hydrocephalus. No extra-axial collection. No midline shift. Local mass effect is present associated with the dominant left parietal lesion. Multiple somewhat heterogeneous hyperattenuating lesions throughout the cerebral hemispheres concerning for intracranial metastatic disease. Dominant lesion in the high left parietal lobe near the vertex measures 2.6 x 2.1 x 2.9 cm. There is surrounding vasogenic edema and local mass effect. Additional 1.2 cm lesion in the right occipital lobe (series 2, image 14). 1.0 cm lesion in the  left cerebral peduncle with edema in the midbrain also extending into the left thalamus. There is a 1.4 cm hyperattenuating lesion in the right parietal lobe with possible adjacent satellite lesion and associated vasogenic edema. Additional lesions noted in the left frontal lobe and in the left basal ganglia. There are additional areas of hypoattenuation in the supratentorial white matter which may reflect a combination of chronic microvascular disease as well as additional areas of vasogenic edema. MRI of the brain with and without contrast is recommended for further evaluation of intracranial lesions. ORBITS: Bilateral lens replacement noted. SINUSES: No acute abnormality. SOFT TISSUES AND SKULL: No acute soft tissue abnormality. No skull fracture. IMPRESSION: 1. 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 Associated vasogenic edema and local mass  effect. No midline shift. 2. MRI of the brain with and without contrast is recommended for further evaluation of the intracranial lesions. Electronically signed by: Donnice Mania MD 05/05/2024 08:25 PM EST RP Workstation: [id number]   DG Chest 1 View Result Date: 05/04/2024 CLINICAL DATA:  Shortness of breath. EXAM: CHEST  1 VIEW COMPARISON:  04/30/2024 FINDINGS: Lung volumes are low.The cardiomediastinal contours are stable. Bronchovascular crowding related to low lung volumes. No consolidation, pleural effusion, or pneumothorax. No acute osseous abnormalities are seen. IMPRESSION: Low lung volumes without acute abnormality. Electronically Signed   By: Andrea Gasman M.D.   On: 05/04/2024 14:15   US  RENAL Result Date: 05/04/2024 EXAM: US  Retroperitoneum Complete, Renal. 05/03/2024 03:06:29 PM TECHNIQUE: Real-time ultrasonography of the retroperitoneum renal was performed. COMPARISON: None available CLINICAL HISTORY: Acute kidney injury, ascites. FINDINGS: FINDINGS: RIGHT KIDNEY/URETER: Right kidney measures 11.7 x 5.0 x 5.1 cm. The right renal parenchyma is isoechoic to the liver. No hydronephrosis. No calculus. No mass. LEFT KIDNEY/URETER: Left kidney measures 10.0 x 5.6 x 5.1 cm. The left renal parenchyma is isoechoic to the spleen. No hydronephrosis. No calculus. No mass. BLADDER: A foley catheter is in place. Gallbladder is collapsed. IMPRESSION: 1. The renal parenchyma is somewhat hyperechoic bilaterally. This is nonspecific, but can be seen in the setting of medical renal disease. 2. Foley catheter in situ. Electronically signed by: Lonni Necessary MD 05/04/2024 08:52 AM EST RP Workstation: [id number]     Assessment and Plan:   ALAA MULLALLY is a 79 y.o. female with a hx of multiple sclerosis, HTN, HLD, and DM2 who is being seen 05/06/2024 for the evaluation of possible HCM and NSVT at the request of Dr. 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  TTE obtained as a part of her workup shows apical hypertrophy.  I independently reviewed the patient's echocardiogram myself and she only has hypertrophy in the apex.  No evidence of SAM or LVOT obstruction.  Furthermore she has no cardiac murmur on physical exam which further supports that she does not have LVOT obstruction.  I reviewed her most remote EKG from 2022 which she does have notable T wave inversions in the precordial leads which is also supportive of possible apical HCM.  I do agree that cardiac MRI would be helpful in confirming this diagnosis; however, this does not need to be done while the patient is inpatient.  This diagnosis would not contribute to her current presentation at all.  I consider this to be an incidental finding that can be further evaluated when she recovers from her current illnesses. Will need cardiology follow-up as an outpatient with me for consideration  of cardiac MRI Recommend maintaining hydration, but her current presentation is not related to this diagnosis  #PSVT #NSVT :: Noted to have a few runs of paroxysmal SVT and nonsustained VT since being on telemetry here inpatient.  She is asymptomatic and hemodynamically stable.  I think this is being driven by her underlying metabolic derangements and it is not related to her possible apical HCM.  I do not favor starting beta-blockade at this time, but rather correcting her derangements as the primary team is doing a great job of currently. Maintain telemetry   #MS #New Brain Lesions #AKI #Dehydration Management per primary  Risk Assessment/Risk Scores:              For questions or updates, please contact  [facility] Please consult [email or web address] for contact info under      Signed, Georganna Archer, MD  05/06/2024 10:13 AM      [1]  Allergies Allergen Reactions   Latex Rash   "

## 2024-05-07 ENCOUNTER — Inpatient Hospital Stay (HOSPITAL_COMMUNITY)

## 2024-05-07 DIAGNOSIS — G9389 Other specified disorders of brain: Secondary | ICD-10-CM | POA: Diagnosis not present

## 2024-05-07 DIAGNOSIS — R911 Solitary pulmonary nodule: Secondary | ICD-10-CM | POA: Diagnosis present

## 2024-05-07 DIAGNOSIS — G936 Cerebral edema: Secondary | ICD-10-CM

## 2024-05-07 DIAGNOSIS — N179 Acute kidney failure, unspecified: Secondary | ICD-10-CM | POA: Diagnosis not present

## 2024-05-07 DIAGNOSIS — I471 Supraventricular tachycardia, unspecified: Secondary | ICD-10-CM | POA: Diagnosis not present

## 2024-05-07 LAB — COMPREHENSIVE METABOLIC PANEL WITH GFR
ALT: 67 U/L — ABNORMAL HIGH (ref 0–44)
AST: 38 U/L (ref 15–41)
Albumin: 3.4 g/dL — ABNORMAL LOW (ref 3.5–5.0)
Alkaline Phosphatase: 40 U/L (ref 38–126)
Anion gap: 12 (ref 5–15)
BUN: 42 mg/dL — ABNORMAL HIGH (ref 8–23)
CO2: 19 mmol/L — ABNORMAL LOW (ref 22–32)
Calcium: 9 mg/dL (ref 8.9–10.3)
Chloride: 109 mmol/L (ref 98–111)
Creatinine, Ser: 1.32 mg/dL — ABNORMAL HIGH (ref 0.44–1.00)
GFR, Estimated: 42 mL/min — ABNORMAL LOW
Glucose, Bld: 209 mg/dL — ABNORMAL HIGH (ref 70–99)
Potassium: 4.4 mmol/L (ref 3.5–5.1)
Sodium: 139 mmol/L (ref 135–145)
Total Bilirubin: 0.2 mg/dL (ref 0.0–1.2)
Total Protein: 5.7 g/dL — ABNORMAL LOW (ref 6.5–8.1)

## 2024-05-07 LAB — PROTIME-INR
INR: 1 (ref 0.8–1.2)
Prothrombin Time: 13.2 s (ref 11.4–15.2)

## 2024-05-07 LAB — CBC
HCT: 32.8 % — ABNORMAL LOW (ref 36.0–46.0)
Hemoglobin: 11.2 g/dL — ABNORMAL LOW (ref 12.0–15.0)
MCH: 30.5 pg (ref 26.0–34.0)
MCHC: 34.1 g/dL (ref 30.0–36.0)
MCV: 89.4 fL (ref 80.0–100.0)
Platelets: 263 K/uL (ref 150–400)
RBC: 3.67 MIL/uL — ABNORMAL LOW (ref 3.87–5.11)
RDW: 15.9 % — ABNORMAL HIGH (ref 11.5–15.5)
WBC: 10.5 K/uL (ref 4.0–10.5)
nRBC: 0.3 % — ABNORMAL HIGH (ref 0.0–0.2)

## 2024-05-07 LAB — GLUCOSE, CAPILLARY
Glucose-Capillary: 216 mg/dL — ABNORMAL HIGH (ref 70–99)
Glucose-Capillary: 233 mg/dL — ABNORMAL HIGH (ref 70–99)
Glucose-Capillary: 198 mg/dL — ABNORMAL HIGH (ref 70–99)
Glucose-Capillary: 247 mg/dL — ABNORMAL HIGH (ref 70–99)

## 2024-05-07 LAB — APTT: aPTT: 34 s (ref 24–36)

## 2024-05-07 MED ORDER — HYDRALAZINE HCL 20 MG/ML IJ SOLN: 5.0000 mg | Freq: Four times a day (QID) | INTRAMUSCULAR | Status: DC | PRN

## 2024-05-07 MED ORDER — LACTULOSE 10 GM/15ML PO SOLN
20.0000 g | Freq: Two times a day (BID) | ORAL | Status: AC
  Administered 2024-05-07: 20 g via ORAL
  Filled 2024-05-07 (×2): qty 30

## 2024-05-07 MED ORDER — LEVETIRACETAM 500 MG PO TABS
750.0000 mg | ORAL_TABLET | Freq: Two times a day (BID) | ORAL | Status: DC
  Administered 2024-05-07 – 2024-05-26 (×37): 750 mg via ORAL
  Filled 2024-05-07 (×39): qty 1

## 2024-05-07 MED ORDER — INSULIN GLARGINE 100 UNIT/ML ~~LOC~~ SOLN
5.0000 [IU] | Freq: Every day | SUBCUTANEOUS | Status: DC
  Administered 2024-05-07 – 2024-05-10 (×3): 5 [IU] via SUBCUTANEOUS
  Filled 2024-05-07 (×4): qty 0.05

## 2024-05-07 NOTE — Progress Notes (Signed)
 " PROGRESS NOTE    Sonya Solomon  FMW:[id number] DOB: 1945/05/24 DOA: 04/30/2024 PCP: Leonel Cole, MD  Brief Narrative: 79 y.o. female with medical history significant for MS, type 2 diabetes, hyperlipidemia, hypertension, obesity, history of gait disturbance, vertigo, urinary urgency, spasticity, insomnia, who presents to the ER due to generalized weakness, dizziness, and a fall.  She was on the floor for about 4 hours before the husband could get help and get her off the floor.  She has been having diarrhea for few days prior to admission.  In the ED her systolic blood pressure was in the 70s diastolic in the 40s.  She was found to have an AKI on CKD stage III A with a creatinine of 5.4 and admitted for the same.  At baseline she walks with a walker and a cane independent with all ADLs but does not cook or clean the house.  She lives at home with her husband.   Assessment & Plan:   Principal Problem:   AKI (acute kidney injury) Active Problems:   MS (multiple sclerosis)   Hyperlipidemia associated with type 2 diabetes mellitus (HCC)   CKD stage 3a, GFR 45-59 ml/min (HCC)   Hyperkalemia   Demand ischemia (HCC)   Pyuria   Diarrhea   Seizure (HCC)   HTN (hypertension)  #1Bilateral diplopia patient with history of MS started complaining of bilateral diplopia during the hospitalization.  A stat CT head showed findings concerning for multiple metastasis. MRI brain 05/06/2024 shows 45 enhancing nodules and ring-enhancing lesions within the cerebral and cerebellar hemispheres bilaterally highly suspicious for metastatic disease with the largest lesion in the left parietal lobe 3.2 x 2 x 3.6 cm and extensive cerebral white matter disease. CT chest 05/07/2024 18 x 14 mm irregular nodular density is noted laterally in right middle lobe concerning for primary pulmonary malignancy. PET scan is recommended for further evaluation. Small bilateral pleural effusions are noted with adjacent subsegmental  atelectasis.3 cm right thyroid  nodule is noted. Recommend thyroid  US . Coronary artery calcifications are noted.Aortic atherosclerosis. CT abdomen and pelvis 04/30/2024 Pancolitis, greatest proximally.Sigmoid and descending colon diverticulosis without diverticulitis. Indeterminate 1.9 cm left adrenal nodule; recommend 1-year follow-up adrenal washout CT.Partially imaged intraatrial lipoma  Continue Decadron  and prior to admission Keppra   #2 AKI on CKD stage IIIa baseline creatinine is around 1.1.  She was found to have a creatinine of 5.4 on admission due to dehydration poor p.o. intake and diarrhea and hypotension.  She was treated with IV fluids.  She was also given midodrine  and Solu-Cortef  initially which has been stopped since her blood pressure has been improved and now on the higher side.  Creatinine is 1.56 from 2.87 from 4.2 from 5.4 on admission.  Renal ultrasound shows some evidence of medical renal disease with no obstruction.    #3 hypotension resolved with fluids midodrine  and Solu-Cortef    #4 E. coli UTI, POA completed treatment with Rocephin .    #5 hyperkalemia resolved  #6 hypomagnesemia resolved after repletion   #7 demand ischemia with NSVT SVT- - Troponin trend relatively flat 126, 141, 169 Cardiology was consulted.  There was a concern for apical hypertrophy question HOCM? Cardiology saw the patient and recommended outpatient follow-up.  They were not in favor of giving Coreg  or beta-blockers at this time. - Echocardiogram negative for regional wall motion abnormality, EF 70 to 75%, grade 1 diastolic dysfunction, elevated pulmonary artery systolic pressure, LV hypertrophy severe?       #8 constipation MiraLAX  and senna  and Dulcolax ordered she was admitted with diarrhea initially.  #9 seizure disorder Continue Keppra  dose increased since renal functions improving   #10 MS followed by Dr. Vear neurology as an outpatient.  Seen by PT OT recommending SNF.  CIR saw the  patient did not think she is a candidate for CIR.   #11 hypertension continue hydralazine  as needed lisinopril  on hold due to AKI will likely resume tomorrow  #12 diabetes mellitus with hyperglycemia on Decadron  -on metformin  Actos  and Ozempic  prior to admission. - A1c 7.0 - Sliding scale insulin  -Will start low-dose Lantus  CBG (last 3)  Recent Labs    05/06/24 2034 05/07/24 0814 05/07/24 1134  GLUCAP 242* 216* 233*   Hyperlipidemia continue Zocor   Estimated body mass index is 33.38 kg/m as calculated from the following:   Height as of this encounter: 5' 4.5 (1.638 m).   Weight as of this encounter: 89.6 kg.  DVT prophylaxis: Heparin   code Status: Full code  family Communication: with patient's husband on the phone  disposition Plan:  Status is: Inpatient Remains inpatient appropriate because: Persistent AKI   Consultants:  None  Procedures: None Antimicrobials:-Rocephin   Subjective:   Reports she does not see double today  CT chest concerning for primary pulmonary malignancy Discussed with Dr. Shelah  Objective: Vitals:   05/06/24 0434 05/06/24 1304 05/06/24 1953 05/07/24 0600  BP: (!) 144/84 (!) 144/55 (!) 132/56 (!) 128/58  Pulse: (!) 56 77 62 (!) 58  Resp: 20 17 18    Temp: 98.2 F (36.8 C) 97.8 F (36.6 C) 97.7 F (36.5 C) 98 F (36.7 C)  TempSrc: Oral Axillary Oral Oral  SpO2: 94% 96% 97% 98%  Weight:      Height:        Intake/Output Summary (Last 24 hours) at 05/07/2024 1400 Last data filed at 05/07/2024 0910 Gross per 24 hour  Intake --  Output 2400 ml  Net -2400 ml   Filed Weights   04/30/24 2001 04/30/24 2015  Weight: 89.6 kg 89.6 kg    Examination:  General exam: Appears  anxious tearful Respiratory system: Clear to auscultation. Respiratory effort normal.  Diminished breath sounds Cardiovascular system:reg  Gastrointestinal system: Abdomen is nondistended, soft and nontender. No organomegaly or masses felt. Normal bowel sounds  heard. Central nervous system: Alert and oriented. Extremities: 1+ bilateral feet edema peers to be chronic    Data Reviewed: I have personally reviewed following labs and imaging studies  CBC: Recent Labs  Lab 04/30/24 2034 05/01/24 0804 05/04/24 0626 05/05/24 0627 05/06/24 1011  WBC 14.4* 10.2 7.0 8.8 9.6  NEUTROABS 11.9*  --   --   --   --   HGB 12.0 10.6* 9.2* 9.9* 10.9*  HCT 35.9* 32.3* 27.0* 29.5* 32.3*  MCV 90.4 92.0 90.3 91.0 91.2  PLT 258 232 203 225 239   Basic Metabolic Panel: Recent Labs  Lab 04/30/24 2034 05/01/24 0804 05/02/24 0251 05/03/24 0251 05/04/24 0626 05/05/24 0627 05/06/24 1011  NA 130*   < > 133* 136 138 140 139  K 6.1*   < > 4.9 4.6 4.5 4.2 4.6  CL 96*   < > 107 112* 112* 114* 110  CO2 18*   < > 14* 14* 16* 16* 17*  GLUCOSE 134*   < > 115* 190* 240* 184* 265*  BUN 79*   < > 74* 73* 69* 67* 53*  CREATININE 5.46*   < > 5.29* 4.29* 2.87* 2.15* 1.56*  CALCIUM 12.5*   < >  10.4* 9.3 8.6* 8.8* 9.0  MG 1.9  --   --   --   --  1.4* 2.0   < > = values in this interval not displayed.   GFR: Estimated Creatinine Clearance: 33.6 mL/min (A) (by C-G formula based on SCr of 1.56 mg/dL (H)). Liver Function Tests: Recent Labs  Lab 04/30/24 2034 05/05/24 0627 05/06/24 1011  AST 35 23 46*  ALT 18 25 70*  ALKPHOS 38 32* 42  BILITOT 0.4 <0.2 0.2  PROT 6.3* 5.3* 6.0*  ALBUMIN  4.0 3.3* 3.5   No results for input(s): LIPASE, AMYLASE in the last 168 hours. No results for input(s): AMMONIA in the last 168 hours. Coagulation Profile: No results for input(s): INR, PROTIME in the last 168 hours. Cardiac Enzymes: Recent Labs  Lab 04/30/24 2034  CKTOTAL 201   BNP (last 3 results) No results for input(s): PROBNP in the last 8760 hours. HbA1C: No results for input(s): HGBA1C in the last 72 hours. CBG: Recent Labs  Lab 05/06/24 1157 05/06/24 1623 05/06/24 2034 05/07/24 0814 05/07/24 1134  GLUCAP 244* 192* 242* 216* 233*   Lipid  Profile: No results for input(s): CHOL, HDL, LDLCALC, TRIG, CHOLHDL, LDLDIRECT in the last 72 hours. Thyroid  Function Tests: No results for input(s): TSH, T4TOTAL, FREET4, T3FREE, THYROIDAB in the last 72 hours. Anemia Panel: No results for input(s): VITAMINB12, FOLATE, FERRITIN, TIBC, IRON, RETICCTPCT in the last 72 hours. Sepsis Labs: No results for input(s): PROCALCITON, LATICACIDVEN in the last 168 hours.  Recent Results (from the past 240 hours)  Urine Culture     Status: Abnormal   Collection Time: 04/30/24  8:24 PM   Specimen: Urine, Random  Result Value Ref Range Status   Specimen Description   Final    URINE, RANDOM Performed at [facility], [address], [place], [place] [address]    Special Requests   Final    NONE Reflexed from [id number] Performed at [facility], [address] [place], [place] [address]    Culture >=100,000 COLONIES/mL ESCHERICHIA COLI (A)  Final   Report Status 05/03/2024 FINAL  Final   Organism ID, Bacteria ESCHERICHIA COLI (A)  Final      Susceptibility   Escherichia coli - MIC*    AMPICILLIN >=32 RESISTANT Resistant     CEFAZOLIN (URINE) Value in next row Sensitive      8 SENSITIVEThis is a modified FDA-approved test that has been validated and its performance characteristics determined by the reporting laboratory.  This laboratory is certified under the Clinical Laboratory Improvement Amendments CLIA as qualified to perform high complexity clinical laboratory testing.    CEFEPIME Value in next row Sensitive      8 SENSITIVEThis is a modified FDA-approved test that has been validated and its performance characteristics determined by the reporting laboratory.  This laboratory is certified under the Clinical Laboratory Improvement Amendments CLIA as qualified to perform high complexity clinical laboratory testing.    ERTAPENEM Value in next row Sensitive      8 SENSITIVEThis  is a modified FDA-approved test that has been validated and its performance characteristics determined by the reporting laboratory.  This laboratory is certified under the Clinical Laboratory Improvement Amendments CLIA as qualified to perform high complexity clinical laboratory testing.    CEFTRIAXONE  Value in next row Sensitive      8 SENSITIVEThis is a modified FDA-approved test that has been validated and its performance characteristics determined by the reporting laboratory.  This laboratory  is certified under the Clinical Laboratory Improvement Amendments CLIA as qualified to perform high complexity clinical laboratory testing.    CIPROFLOXACIN Value in next row Sensitive      8 SENSITIVEThis is a modified FDA-approved test that has been validated and its performance characteristics determined by the reporting laboratory.  This laboratory is certified under the Clinical Laboratory Improvement Amendments CLIA as qualified to perform high complexity clinical laboratory testing.    GENTAMICIN Value in next row Sensitive      8 SENSITIVEThis is a modified FDA-approved test that has been validated and its performance characteristics determined by the reporting laboratory.  This laboratory is certified under the Clinical Laboratory Improvement Amendments CLIA as qualified to perform high complexity clinical laboratory testing.    NITROFURANTOIN Value in next row Sensitive      8 SENSITIVEThis is a modified FDA-approved test that has been validated and its performance characteristics determined by the reporting laboratory.  This laboratory is certified under the Clinical Laboratory Improvement Amendments CLIA as qualified to perform high complexity clinical laboratory testing.    TRIMETH/SULFA Value in next row Sensitive      8 SENSITIVEThis is a modified FDA-approved test that has been validated and its performance characteristics determined by the reporting laboratory.  This laboratory is certified under  the Clinical Laboratory Improvement Amendments CLIA as qualified to perform high complexity clinical laboratory testing.    AMPICILLIN/SULBACTAM Value in next row Resistant      8 SENSITIVEThis is a modified FDA-approved test that has been validated and its performance characteristics determined by the reporting laboratory.  This laboratory is certified under the Clinical Laboratory Improvement Amendments CLIA as qualified to perform high complexity clinical laboratory testing.    PIP/TAZO Value in next row Sensitive      <=4 SENSITIVEThis is a modified FDA-approved test that has been validated and its performance characteristics determined by the reporting laboratory.  This laboratory is certified under the Clinical Laboratory Improvement Amendments CLIA as qualified to perform high complexity clinical laboratory testing.    MEROPENEM Value in next row Sensitive      <=4 SENSITIVEThis is a modified FDA-approved test that has been validated and its performance characteristics determined by the reporting laboratory.  This laboratory is certified under the Clinical Laboratory Improvement Amendments CLIA as qualified to perform high complexity clinical laboratory testing.    * >=100,000 COLONIES/mL ESCHERICHIA COLI  C Difficile Quick Screen w PCR reflex     Status: None   Collection Time: 04/30/24 10:05 PM   Specimen: STOOL  Result Value Ref Range Status   C Diff antigen NEGATIVE NEGATIVE Final   C Diff toxin NEGATIVE NEGATIVE Final   C Diff interpretation No C. difficile detected.  Final    Comment: Performed at [facility], [address], [place], [place] [address]  Blood culture (routine x 2)     Status: None   Collection Time: 05/01/24  4:00 AM   Specimen: BLOOD  Result Value Ref Range Status   Specimen Description   Final    BLOOD BLOOD RIGHT ARM Performed at [facility], [address] [place], [place] [address]    Special Requests   Final     BOTTLES DRAWN AEROBIC ONLY Blood Culture results may not be optimal due to an inadequate volume of blood received in culture bottles Performed at [facility], [address] [place], [place] [address]    Culture   Final  NO GROWTH 5 DAYS Performed at [facility], [address] [place], [place] [address]    Report Status 05/06/2024 FINAL  Final  Blood culture (routine x 2)     Status: None   Collection Time: 05/01/24  4:04 AM   Specimen: BLOOD  Result Value Ref Range Status   Specimen Description   Final    BLOOD LEFT ANTECUBITAL Performed at [facility], [address], [place], [place] [address]    Special Requests   Final    BOTTLES DRAWN AEROBIC AND ANAEROBIC Blood Culture results may not be optimal due to an inadequate volume of blood received in culture bottles Performed at [facility], [address], [place], [place] [address]    Culture   Final    NO GROWTH 5 DAYS Performed at [facility], [address], [place], [place] [address]    Report Status 05/06/2024 FINAL  Final         Radiology Studies: CT CHEST WO CONTRAST Result Date: 05/07/2024 CLINICAL DATA:  Intracranial metastases. EXAM: CT CHEST WITHOUT CONTRAST TECHNIQUE: Multidetector CT imaging of the chest was performed following the standard protocol without IV contrast. RADIATION DOSE REDUCTION: This exam was performed according to the departmental dose-optimization program which includes automated exposure control, adjustment of the mA and/or kV according to patient size and/or use of iterative reconstruction technique. COMPARISON:  Yesterday FINDINGS: Cardiovascular: Aortic atherosclerosis. Mild cardiomegaly. No pericardial effusion. Coronary artery calcifications are noted. Mediastinum/Nodes: 3 cm right thyroid  nodule is noted. Esophagus and trachea are unremarkable. No definite adenopathy is noted, although evaluation is limited due to  lack of intravenous contrast. Lungs/Pleura: No pneumothorax is noted. Small bilateral pleural effusions are noted with adjacent subsegmental atelectasis. 18 x 14 mm irregular nodular density is noted laterally in right middle lobe best seen in image number 106 of series 4, concerning for primary pulmonary malignancy. PET scan is recommended for further evaluation. Upper Abdomen: No acute abnormality. Musculoskeletal: No chest wall mass or suspicious bone lesions identified. IMPRESSION: 1. 18 x 14 mm irregular nodular density is noted laterally in right middle lobe concerning for primary pulmonary malignancy. PET scan is recommended for further evaluation. 2. Small bilateral pleural effusions are noted with adjacent subsegmental atelectasis. 3. 3 cm right thyroid  nodule is noted. Recommend thyroid  US . (Ref: J Am Coll Radiol. 2015 Feb;12(2): 143-50). 4. Coronary artery calcifications are noted. 5. Aortic atherosclerosis. Aortic Atherosclerosis (ICD10-I70.0). Electronically Signed   By: Lynwood Landy Raddle M.D.   On: 05/07/2024 11:06   DG Chest 1 View Result Date: 05/06/2024 EXAM: 1 VIEW(S) XRAY OF THE CHEST 05/06/2024 09:00:00 AM COMPARISON: 05/04/2024 CLINICAL HISTORY: SOB (shortness of breath) FINDINGS: LUNGS AND PLEURA: Decreased interstitial edema compared with the previous exam. No focal pulmonary opacity. No pleural effusion. No pneumothorax. HEART AND MEDIASTINUM: Cardiomegaly. Aortic arch atherosclerosis. BONES AND SOFT TISSUES: No acute osseous abnormality. IMPRESSION: 1. No acute findings. 2. Resolved interstitial edema compared with the previous exam. 3. Cardiomegaly. Electronically signed by: Waddell Calk MD 05/06/2024 12:55 PM EST RP Workstation: [id number]   MR BRAIN W WO CONTRAST Result Date: 05/06/2024 EXAM: MRI BRAIN WITH AND WITHOUT CONTRAST 05/06/2024 08:57:23 AM TECHNIQUE: Multiplanar multisequence MRI of the head/brain was performed with and without the administration of 9 mL gadobutrol   (GADAVIST ) 1 MMOL/ML injection. COMPARISON: MRI of the head dated 04/20/2020 and CT of the head dated 05/05/2024. CLINICAL HISTORY: Diplopia. FINDINGS: BRAIN AND VENTRICLES: No acute infarct. No acute intracranial  hemorrhage. No mass effect or midline shift. No hydrocephalus. The sella is unremarkable. Normal flow voids. There are numerous enhancing nodules and ring enhancing lesions within the cerebral and cerebellar hemispheres bilaterally, as demonstrated on the previous CT, which are highly suspicious for metastatic disease. The largest lesion is situated within the left parietal lobe, measuring approximately 3.2 x 2.0 x 3.6 cm. There are approximately 45 enhancing lesions within the cerebral and cerebellar hemispheres. There is a lesion within the left thalamus and left cerebral peduncle seen on image 16 of series 18, measuring approximately 14 x 11 x 14 mm. There is also a lesion within the left basal ganglia measuring approximately 15 x 12 x 11 mm. None of the lesions demonstrate restricted diffusion. There is extensive cerebral white matter disease present. ORBITS: No acute abnormality. SINUSES: No acute abnormality. BONES AND SOFT TISSUES: Normal bone marrow signal and enhancement. No acute soft tissue abnormality. IMPRESSION: 1. Numerous (approximately 45) enhancing nodules and ring enhancing lesions within the cerebral and cerebellar hemispheres bilaterally, highly suspicious for metastatic disease, with the largest lesion in the left parietal lobe measuring approximately 3.2 x 2.0 x 3.6 cm. 2. Extensive cerebral white matter disease. Electronically signed by: Evalene Coho MD 05/06/2024 10:15 AM EST RP Workstation: [id number]   CT HEAD WO CONTRAST ( ) Result Date: 05/05/2024 EXAM: CT HEAD WITHOUT CONTRAST 05/05/2024 08:11:00 PM TECHNIQUE: CT of the head was performed without the administration of intravenous contrast. Automated exposure control, iterative reconstruction, and/or weight based  adjustment of the mA/kV was utilized to reduce the radiation dose to as low as reasonably achievable. COMPARISON: MRI head 04/20/2020. CLINICAL HISTORY: Diplopia; Headache, sudden, severe. FINDINGS: BRAIN AND VENTRICLES: No acute hemorrhage. No evidence of acute infarct. No hydrocephalus. No extra-axial collection. No midline shift. Local mass effect is present associated with the dominant left parietal lesion. Multiple somewhat heterogeneous hyperattenuating lesions throughout the cerebral hemispheres concerning for intracranial metastatic disease. Dominant lesion in the high left parietal lobe near the vertex measures 2.6 x 2.1 x 2.9 cm. There is surrounding vasogenic edema and local mass effect. Additional 1.2 cm lesion in the right occipital lobe (series 2, image 14). 1.0 cm lesion in the left cerebral peduncle with edema in the midbrain also extending into the left thalamus. There is a 1.4 cm hyperattenuating lesion in the right parietal lobe with possible adjacent satellite lesion and associated vasogenic edema. Additional lesions noted in the left frontal lobe and in the left basal ganglia. There are additional areas of hypoattenuation in the supratentorial white matter which may reflect a combination of chronic microvascular disease as well as additional areas of vasogenic edema. MRI of the brain with and without contrast is recommended for further evaluation of intracranial lesions. ORBITS: Bilateral lens replacement noted. SINUSES: No acute abnormality. SOFT TISSUES AND SKULL: No acute soft tissue abnormality. No skull fracture. IMPRESSION: 1. 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 Associated vasogenic edema and local mass effect. No midline shift. 2. MRI of the brain with and without contrast is recommended for further evaluation of the intracranial lesions. Electronically signed by: Donnice Mania MD 05/05/2024 08:25 PM  EST RP Workstation: [id number]     Scheduled Meds:  Chlorhexidine  Gluconate Cloth  6 each Topical Daily   desmopressin   0.1 mg Oral QHS   dexamethasone   4 mg Oral Q8H   feeding supplement  237 mL Oral BID BM   heparin   5,000 Units Subcutaneous Q8H   insulin  aspart  0-5 Units Subcutaneous QHS   insulin  aspart  0-6 Units Subcutaneous TID WC   lactulose   20 g Oral BID   levETIRAcetam   750 mg Oral BID   midodrine   5 mg Oral TID WC   pantoprazole   40 mg Oral Daily   polyethylene glycol  17 g Oral BID   simvastatin   40 mg Oral q1800   Continuous Infusions:  sodium chloride  75 mL/hr at 05/06/24 0247     LOS: 7 days    Almarie KANDICE Hoots, MD 05/07/2024, 2:00 PM   "

## 2024-05-07 NOTE — Progress Notes (Signed)
 "  Rounding Note   Patient Name: Sonya Solomon Date of Encounter: 05/07/2024  [facility] Cardiologist: None   Subjective - No acute events overnight - Patient underwent a brain MRI which demonstrated multiple brain lesions concerning for metastatic disease  Scheduled Meds:  Chlorhexidine  Gluconate Cloth  6 each Topical Daily   desmopressin   0.1 mg Oral QHS   dexamethasone   4 mg Oral Q8H   feeding supplement  237 mL Oral BID BM   heparin   5,000 Units Subcutaneous Q8H   insulin  aspart  0-5 Units Subcutaneous QHS   insulin  aspart  0-6 Units Subcutaneous TID WC   levETIRAcetam   500 mg Oral BID   midodrine   5 mg Oral TID WC   pantoprazole   40 mg Oral Daily   polyethylene glycol  17 g Oral BID   simvastatin   40 mg Oral q1800   Continuous Infusions:  sodium chloride  75 mL/hr at 05/06/24 0247   PRN Meds: acetaminophen , ALPRAZolam , docusate sodium , melatonin, prochlorperazine    Vital Signs  Vitals:   05/06/24 0434 05/06/24 1304 05/06/24 1953 05/07/24 0600  BP: (!) 144/84 (!) 144/55 (!) 132/56 (!) 128/58  Pulse: (!) 56 77 62 (!) 58  Resp: 20 17 18    Temp: 98.2 F (36.8 C) 97.8 F (36.6 C) 97.7 F (36.5 C) 98 F (36.7 C)  TempSrc: Oral Axillary Oral Oral  SpO2: 94% 96% 97% 98%  Weight:      Height:        Intake/Output Summary (Last 24 hours) at 05/07/2024 0844 Last data filed at 05/06/2024 1845 Gross per 24 hour  Intake --  Output 1600 ml  Net -1600 ml      04/30/2024    8:15 PM 04/30/2024    8:01 PM 12/09/2023    2:47 PM  Last 3 Weights  Weight (lbs) 197 lb 8.5 oz 197 lb 8.5 oz 197 lb 8 oz  Weight (kg) 89.6 kg 89.6 kg 89.585 kg      Telemetry Sinus bradycardia, 1 short run of PSVT at 7 PM yesterday- Personally Reviewed  ECG  No new ECG  Physical Exam  General:  Well nourished, well developed, in no acute distress, very pleasant HEENT: normal Neck: no JVD Vascular: No carotid bruits; Distal pulses 2+ bilaterally Cardiac:  normal S1, S2; RRR; no  murmur  Lungs:  clear to auscultation bilaterally, no wheezing, rhonchi or rales  Abd: soft, nontender, no hepatomegaly  Ext: no edema Musculoskeletal:  No gross deformities but reported decreased strength in lower extremities Skin: warm and dry  Neuro: No gross deformities Psych:  Normal affect   Labs High Sensitivity Troponin:  No results for input(s): TROPONINIHS in the last 720 hours.  Recent Labs  Lab 04/30/24 2034 05/01/24 0141 05/01/24 0404 05/01/24 1801 05/05/24 1421  TRNPT 126* 141* 169* 158* 55*       Chemistry Recent Labs  Lab 04/30/24 2034 05/01/24 0804 05/04/24 0626 05/05/24 0627 05/06/24 1011  NA 130*   < > 138 140 139  K 6.1*   < > 4.5 4.2 4.6  CL 96*   < > 112* 114* 110  CO2 18*   < > 16* 16* 17*  GLUCOSE 134*   < > 240* 184* 265*  BUN 79*   < > 69* 67* 53*  CREATININE 5.46*   < > 2.87* 2.15* 1.56*  CALCIUM 12.5*   < > 8.6* 8.8* 9.0  MG 1.9  --   --  1.4* 2.0  PROT 6.3*  --   --  5.3* 6.0*  ALBUMIN  4.0  --   --  3.3* 3.5  AST 35  --   --  23 46*  ALT 18  --   --  25 70*  ALKPHOS 38  --   --  32* 42  BILITOT 0.4  --   --  <0.2 0.2  GFRNONAA 8*   < > 16* 23* 34*  ANIONGAP 16*   < > 10 11 12    < > = values in this interval not displayed.    Lipids No results for input(s): CHOL, TRIG, HDL, LABVLDL, LDLCALC, CHOLHDL in the last 168 hours.  Hematology Recent Labs  Lab 05/04/24 0626 05/05/24 0627 05/06/24 1011  WBC 7.0 8.8 9.6  RBC 2.99* 3.24* 3.54*  HGB 9.2* 9.9* 10.9*  HCT 27.0* 29.5* 32.3*  MCV 90.3 91.0 91.2  MCH 30.8 30.6 30.8  MCHC 34.1 33.6 33.7  RDW 15.4 15.6* 15.9*  PLT 203 225 239   Thyroid  No results for input(s): TSH, FREET4 in the last 168 hours.  BNPNo results for input(s): BNP, PROBNP in the last 168 hours.  DDimer No results for input(s): DDIMER in the last 168 hours.   Radiology  DG Chest 1 View Result Date: 05/06/2024 EXAM: 1 VIEW(S) XRAY OF THE CHEST 05/06/2024 09:00:00 AM COMPARISON: 05/04/2024  CLINICAL HISTORY: SOB (shortness of breath) FINDINGS: LUNGS AND PLEURA: Decreased interstitial edema compared with the previous exam. No focal pulmonary opacity. No pleural effusion. No pneumothorax. HEART AND MEDIASTINUM: Cardiomegaly. Aortic arch atherosclerosis. BONES AND SOFT TISSUES: No acute osseous abnormality. IMPRESSION: 1. No acute findings. 2. Resolved interstitial edema compared with the previous exam. 3. Cardiomegaly. Electronically signed by: Waddell Calk MD 05/06/2024 12:55 PM EST RP Workstation: [id number]   MR BRAIN W WO CONTRAST Result Date: 05/06/2024 EXAM: MRI BRAIN WITH AND WITHOUT CONTRAST 05/06/2024 08:57:23 AM TECHNIQUE: Multiplanar multisequence MRI of the head/brain was performed with and without the administration of 9 mL gadobutrol  (GADAVIST ) 1 MMOL/ML injection. COMPARISON: MRI of the head dated 04/20/2020 and CT of the head dated 05/05/2024. CLINICAL HISTORY: Diplopia. FINDINGS: BRAIN AND VENTRICLES: No acute infarct. No acute intracranial hemorrhage. No mass effect or midline shift. No hydrocephalus. The sella is unremarkable. Normal flow voids. There are numerous enhancing nodules and ring enhancing lesions within the cerebral and cerebellar hemispheres bilaterally, as demonstrated on the previous CT, which are highly suspicious for metastatic disease. The largest lesion is situated within the left parietal lobe, measuring approximately 3.2 x 2.0 x 3.6 cm. There are approximately 45 enhancing lesions within the cerebral and cerebellar hemispheres. There is a lesion within the left thalamus and left cerebral peduncle seen on image 16 of series 18, measuring approximately 14 x 11 x 14 mm. There is also a lesion within the left basal ganglia measuring approximately 15 x 12 x 11 mm. None of the lesions demonstrate restricted diffusion. There is extensive cerebral white matter disease present. ORBITS: No acute abnormality. SINUSES: No acute abnormality. BONES AND SOFT TISSUES: Normal  bone marrow signal and enhancement. No acute soft tissue abnormality. IMPRESSION: 1. Numerous (approximately 45) enhancing nodules and ring enhancing lesions within the cerebral and cerebellar hemispheres bilaterally, highly suspicious for metastatic disease, with the largest lesion in the left parietal lobe measuring approximately 3.2 x 2.0 x 3.6 cm. 2. Extensive cerebral white matter disease. Electronically signed by: Evalene Coho MD 05/06/2024 10:15 AM EST RP Workstation: [id number]   CT HEAD WO CONTRAST ( ) Result  Date: 05/05/2024 EXAM: CT HEAD WITHOUT CONTRAST 05/05/2024 08:11:00 PM TECHNIQUE: CT of the head was performed without the administration of intravenous contrast. Automated exposure control, iterative reconstruction, and/or weight based adjustment of the mA/kV was utilized to reduce the radiation dose to as low as reasonably achievable. COMPARISON: MRI head 04/20/2020. CLINICAL HISTORY: Diplopia; Headache, sudden, severe. FINDINGS: BRAIN AND VENTRICLES: No acute hemorrhage. No evidence of acute infarct. No hydrocephalus. No extra-axial collection. No midline shift. Local mass effect is present associated with the dominant left parietal lesion. Multiple somewhat heterogeneous hyperattenuating lesions throughout the cerebral hemispheres concerning for intracranial metastatic disease. Dominant lesion in the high left parietal lobe near the vertex measures 2.6 x 2.1 x 2.9 cm. There is surrounding vasogenic edema and local mass effect. Additional 1.2 cm lesion in the right occipital lobe (series 2, image 14). 1.0 cm lesion in the left cerebral peduncle with edema in the midbrain also extending into the left thalamus. There is a 1.4 cm hyperattenuating lesion in the right parietal lobe with possible adjacent satellite lesion and associated vasogenic edema. Additional lesions noted in the left frontal lobe and in the left basal ganglia. There are additional areas of hypoattenuation in the  supratentorial white matter which may reflect a combination of chronic microvascular disease as well as additional areas of vasogenic edema. MRI of the brain with and without contrast is recommended for further evaluation of intracranial lesions. ORBITS: Bilateral lens replacement noted. SINUSES: No acute abnormality. SOFT TISSUES AND SKULL: No acute soft tissue abnormality. No skull fracture. IMPRESSION: 1. 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 Associated vasogenic edema and local mass effect. No midline shift. 2. MRI of the brain with and without contrast is recommended for further evaluation of the intracranial lesions. Electronically signed by: Donnice Mania MD 05/05/2024 08:25 PM EST RP Workstation: [id number]      Patient Profile   DEZRA MANDELLA is a 79 y.o. female with a hx of multiple sclerosis, HTN, HLD, and DM2 who is being seen 05/06/2024 for the evaluation of possible HCM and NSVT at the request of Dr. 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  TTE obtained as a part of her workup shows apical hypertrophy.  I independently reviewed the patient's echocardiogram myself and she only has hypertrophy in the apex.  No evidence of SAM or LVOT obstruction.  Furthermore she has no cardiac murmur on physical exam which further supports that she does not have LVOT obstruction.  I reviewed her most remote EKG from 2022 which she does have notable T wave inversions in the precordial leads which is also supportive of possible apical HCM.  I do agree that cardiac MRI would be helpful in confirming this diagnosis; however, this does not need to be done while the patient is inpatient.  This diagnosis would not contribute to her current presentation at all.  I consider this to be an incidental finding that can be further evaluated when  she recovers from her current illnesses. Will need cardiology follow-up as an outpatient with me for consideration of cardiac MRI Recommend maintaining hydration, but her current presentation is not related to this diagnosis   #PSVT #NSVT :: Noted to have a few runs of paroxysmal SVT and nonsustained VT since being on telemetry here inpatient.  She is asymptomatic and hemodynamically stable.  I think this is being driven by her  underlying metabolic derangements and it is not related to her possible apical HCM.  I do not favor starting beta-blockade at this time, but rather correcting her derangements as the primary team is doing a great job of currently.  Furthermore, she has resting sinus bradycardia so beta-blocker would only make her feel worse. Maintain telemetry     #MS #New Brain Lesions #AKI #Dehydration Management per primary     [facility] will sign off.   Medication Recommendations:  N/A Other recommendations (labs, testing, etc):  N/A Follow up as an outpatient: Cardiology will arrange cardiology follow-up For questions or updates, please contact [facility] Please consult [email or web address] for contact info under       Signed, Georganna Archer, MD  05/07/2024, 8:44 AM    "

## 2024-05-07 NOTE — Consult Note (Signed)
 "  NAME:  Sonya Solomon, MRN:  [id number], DOB:  October 16, 1945, LOS: 7 ADMISSION DATE:  04/30/2024, CONSULTATION DATE: 05/07/2024 REFERRING MD: Dr. Alvia, CHIEF COMPLAINT: Right middle lobe nodule  History of Present Illness:  Sonya Solomon is 54 with a history of MS and associated gait disturbance and spasticity, diabetes, hyperlipidemia, hypertension, obesity, seizure disorder.  She was evaluated in the emergency department 12/28 after a fall.  She was noted to be hypotensive with acute on chronic stage IIIa renal failure in the setting of E. coli UTI.  She responded to IV fluid resuscitation, temporarily received stress dose steroids and midodrine , antibiotics. During the hospitalization patient complained of diplopia.  Brain imaging identified numerous ring-enhancing cerebral and cerebellar lesions concerning for malignancy including a 3.2 x 2 x 3.6 cm left parietal lobe lesion. Subsequent evaluation for malignancy included:  -CT chest that identified an 18 x 14 mm irregular right middle lobe nodule and small bilateral effusions.  There was also a 3 cm right thyroid  nodule - CT abdomen/pelvis showed pancolitis and diverticular disease with an indeterminate 1.9 cm left adrenal nodule  Pertinent  Medical History   Past Medical History:  Diagnosis Date   Diabetes mellitus without complication (HCC)    MS (multiple sclerosis)     Significant Hospital Events: Including procedures, antibiotic start and stop dates in addition to other pertinent events     Interim History / Subjective:  Having constipation  Objective    Blood pressure (!) 174/92, pulse 66, temperature 97.9 F (36.6 C), resp. rate 15, height 5' 4.5 (1.638 m), weight 89.6 kg, SpO2 97%.        Intake/Output Summary (Last 24 hours) at 05/07/2024 1638 Last data filed at 05/07/2024 0910 Gross per 24 hour  Intake --  Output 2400 ml  Net -2400 ml   Filed Weights   04/30/24 2001 04/30/24 2015  Weight: 89.6 kg 89.6 kg     Examination: General: Chronically ill-appearing woman HENT: Oropharynx clear, no secretions Lungs: Decreased both bases, otherwise clear Cardiovascular: Regular, no murmur Abdomen: Obese, nondistended positive bowel sounds Extremities: No edema Neuro: Awake and alert, interacting appropriately and following commands.  She occasionally has some stuttering speech due to her MS.  Good strength in the upper extremities GU: Deferred  Resolved problem list   Assessment and Plan   Evaluation highly suspicious for malignancy with ring-enhancing brain lesions.  Unclear primary but consider thyroid  cancer or primary lung cancer.  The right middle lobe nodule is smaller than I would expect to be responsible for metastatic disease to the brain but certainly possible. -Optimally we would perform a PET scan to best identify possible sites for biopsy.  This is likely not possible as she is an inpatient with neurological symptoms and may need a diagnosis before an outpatient PET can be done -Agree with thyroid  ultrasound and any appropriate biopsy that is recommended after that study.  Await results - Most straightforward biopsy target may be the 1.9 cm left adrenal nodule.  Will need to assess radiologist suspicion that this could be metastatic disease.  If suspicion moderate to high then biopsy in IR should be considered. -The right middle lobe nodule should be reachable by robotic assisted navigational bronchoscopy.  Could consider performing under general anesthesia if no other amenable site to give a diagnosis.  This could potentially give a diagnosis without any need for brain biopsy although as mentioned the pulmonary nodule is small and does not easily explain the brain findings.  Difficult IV access, need for frequent labs - May need to consider PICC placement if there is no plan for port placement anytime soon  Labs   CBC: Recent Labs  Lab 04/30/24 2034 05/01/24 0804 05/04/24 0626  05/05/24 0627 05/06/24 1011  WBC 14.4* 10.2 7.0 8.8 9.6  NEUTROABS 11.9*  --   --   --   --   HGB 12.0 10.6* 9.2* 9.9* 10.9*  HCT 35.9* 32.3* 27.0* 29.5* 32.3*  MCV 90.4 92.0 90.3 91.0 91.2  PLT 258 232 203 225 239    Basic Metabolic Panel: Recent Labs  Lab 04/30/24 2034 05/01/24 0804 05/02/24 0251 05/03/24 0251 05/04/24 0626 05/05/24 0627 05/06/24 1011  NA 130*   < > 133* 136 138 140 139  K 6.1*   < > 4.9 4.6 4.5 4.2 4.6  CL 96*   < > 107 112* 112* 114* 110  CO2 18*   < > 14* 14* 16* 16* 17*  GLUCOSE 134*   < > 115* 190* 240* 184* 265*  BUN 79*   < > 74* 73* 69* 67* 53*  CREATININE 5.46*   < > 5.29* 4.29* 2.87* 2.15* 1.56*  CALCIUM 12.5*   < > 10.4* 9.3 8.6* 8.8* 9.0  MG 1.9  --   --   --   --  1.4* 2.0   < > = values in this interval not displayed.   GFR: Estimated Creatinine Clearance: 33.6 mL/min (A) (by C-G formula based on SCr of 1.56 mg/dL (H)). Recent Labs  Lab 05/01/24 0804 05/04/24 0626 05/05/24 0627 05/06/24 1011  WBC 10.2 7.0 8.8 9.6    Liver Function Tests: Recent Labs  Lab 04/30/24 2034 05/05/24 0627 05/06/24 1011  AST 35 23 46*  ALT 18 25 70*  ALKPHOS 38 32* 42  BILITOT 0.4 <0.2 0.2  PROT 6.3* 5.3* 6.0*  ALBUMIN  4.0 3.3* 3.5   No results for input(s): LIPASE, AMYLASE in the last 168 hours. No results for input(s): AMMONIA in the last 168 hours.  ABG    Component Value Date/Time   TCO2 22 02/28/2024 2249     Coagulation Profile: No results for input(s): INR, PROTIME in the last 168 hours.  Cardiac Enzymes: Recent Labs  Lab 04/30/24 2034  CKTOTAL 201    HbA1C: Hemoglobin A1C  Date/Time Value Ref Range Status  06/08/2020 12:00 AM 8.9  Final   Hgb A1c MFr Bld  Date/Time Value Ref Range Status  04/30/2024 12:22 AM 7.0 (H) 4.8 - 5.6 % Final    Comment:    (NOTE) Diagnosis of Diabetes The following HbA1c ranges recommended by the American Diabetes Association (ADA) may be used as an aid in the diagnosis of  diabetes mellitus.  Hemoglobin             Suggested A1C NGSP%              Diagnosis  <5.7                   Non Diabetic  5.7-6.4                Pre-Diabetic  >6.4                   Diabetic  <7.0                   Glycemic control for  adults with diabetes.    10/03/2020 12:02 PM 7.2 (H) <5.7 % of total Hgb Final    Comment:    For someone without known diabetes, a hemoglobin A1c value of 6.5% or greater indicates that they may have  diabetes and this should be confirmed with a follow-up  test. . For someone with known diabetes, a value <7% indicates  that their diabetes is well controlled and a value  greater than or equal to 7% indicates suboptimal  control. A1c targets should be individualized based on  duration of diabetes, age, comorbid conditions, and  other considerations. . Currently, no consensus exists regarding use of hemoglobin A1c for diagnosis of diabetes for children. .     CBG: Recent Labs  Lab 05/06/24 1157 05/06/24 1623 05/06/24 2034 05/07/24 0814 05/07/24 1134  GLUCAP 244* 192* 242* 216* 233*    Review of Systems:   As per HPI  Past Medical History:  She,  has a past medical history of Diabetes mellitus without complication (HCC) and MS (multiple sclerosis).   Surgical History:   Past Surgical History:  Procedure Laterality Date   ABDOMINAL HYSTERECTOMY     APPENDECTOMY     BOWEL RESECTION  1996   BREAST REDUCTION SURGERY  07/10/1986   CATARACT EXTRACTION, BILATERAL     12/21/19, 09/19.21   CHOLECYSTECTOMY  05/2004   DE QUERVAIN'S RELEASE  05/18/2014   RADIAL STYLOIDECTOMY WRIST Left 05/10/2012   REDUCTION MAMMAPLASTY     TRIGGER FINGER RELEASE Left 01/09/2011   UMBILICAL HERNIA REPAIR  08/10/2006     Social History:   reports that she has never smoked. She has never used smokeless tobacco. She reports that she does not drink alcohol and does not use drugs.   Family History:  Her family history  includes Breast cancer in her mother; Heart attack in her father.   Allergies Allergies[1]   Home Medications  Prior to Admission medications  Medication Sig Start Date End Date Taking? Authorizing Provider  HYDROcodone -acetaminophen  (NORCO) 7.5-325 MG tablet Take 0.5-1 tablets by mouth 2 (two) times daily as needed. 04/14/24  Yes Sater, Charlie LABOR, MD  levETIRAcetam  (KEPPRA ) 750 MG tablet Take 1 tablet (750 mg total) by mouth 2 (two) times daily. 03/02/24  Yes Sater, Charlie LABOR, MD  lisinopril  (ZESTRIL ) 10 MG tablet TAKE 1 TABLET(10 MG) BY MOUTH DAILY 07/13/22  Yes Duanne Butler DASEN, MD  loperamide  (IMODIUM ) 2 MG capsule Take 1 capsule (2 mg total) by mouth as needed for diarrhea or loose stools. 05/10/20  Yes [place], Theodoro FALCON, MD  metFORMIN  (GLUCOPHAGE ) 1000 MG tablet TAKE 1 TABLET(1000 MG) BY MOUTH TWICE DAILY WITH A MEAL 04/29/21  Yes Chandra Raisin A, NP  omeprazole (PRILOSEC) 20 MG capsule Take 20 mg by mouth daily. 07/07/21  Yes [provider]  pioglitazone  (ACTOS ) 45 MG tablet Take 1 tablet (45 mg total) by mouth daily. 01/27/21  Yes Chandra Raisin A, NP  Semaglutide , 1 MG/DOSE, (OZEMPIC , 1 MG/DOSE,) 2 MG/1.5ML SOPN Inject 1 mg into the skin once a week. 08/05/20  Yes Chandra Raisin LABOR, NP  simvastatin  (ZOCOR ) 40 MG tablet TAKE 1 TABLET(40 MG) BY MOUTH DAILY 08/11/21  Yes Duanne Butler DASEN, MD  tiZANidine  (ZANAFLEX ) 2 MG tablet Take 1 tablet (2 mg total) by mouth every 8 (eight) hours as needed for muscle spasms. 12/30/23  Yes Sater, Charlie LABOR, MD  desmopressin  (DDAVP ) 0.1 MG tablet Take 1 tablet (0.1 mg total) by mouth at bedtime. 06/01/23   Vear Charlie  A, MD  doxepin  (SINEQUAN ) 10 MG capsule Take 1 capsule (10 mg total) by mouth at bedtime. 12/09/23   Sater, Charlie LABOR, MD  LORazepam (ATIVAN) 1 MG tablet Take 1 mg by mouth every 3 (three) days. Patient not taking: Reported on 04/30/2024 02/04/24   [provider]     Critical care time: NA     Lamar Chris, MD,  PhD 05/07/2024, 4:38 PM [facility] [phone] or if no answer before 7:00PM call [phone] For any issues after 7:00PM please call eLink [phone]          [1]  Allergies Allergen Reactions   Latex Rash   "

## 2024-05-07 NOTE — H&P (View-Only) (Signed)
 "  NAME:  Sonya Solomon, MRN:  [id number], DOB:  October 16, 1945, LOS: 7 ADMISSION DATE:  04/30/2024, CONSULTATION DATE: 05/07/2024 REFERRING MD: Dr. Alvia, CHIEF COMPLAINT: Right middle lobe nodule  History of Present Illness:  Sonya Solomon is 54 with a history of MS and associated gait disturbance and spasticity, diabetes, hyperlipidemia, hypertension, obesity, seizure disorder.  She was evaluated in the emergency department 12/28 after a fall.  She was noted to be hypotensive with acute on chronic stage IIIa renal failure in the setting of E. coli UTI.  She responded to IV fluid resuscitation, temporarily received stress dose steroids and midodrine , antibiotics. During the hospitalization patient complained of diplopia.  Brain imaging identified numerous ring-enhancing cerebral and cerebellar lesions concerning for malignancy including a 3.2 x 2 x 3.6 cm left parietal lobe lesion. Subsequent evaluation for malignancy included:  -CT chest that identified an 18 x 14 mm irregular right middle lobe nodule and small bilateral effusions.  There was also a 3 cm right thyroid  nodule - CT abdomen/pelvis showed pancolitis and diverticular disease with an indeterminate 1.9 cm left adrenal nodule  Pertinent  Medical History   Past Medical History:  Diagnosis Date   Diabetes mellitus without complication (HCC)    MS (multiple sclerosis)     Significant Hospital Events: Including procedures, antibiotic start and stop dates in addition to other pertinent events     Interim History / Subjective:  Having constipation  Objective    Blood pressure (!) 174/92, pulse 66, temperature 97.9 F (36.6 C), resp. rate 15, height 5' 4.5 (1.638 m), weight 89.6 kg, SpO2 97%.        Intake/Output Summary (Last 24 hours) at 05/07/2024 1638 Last data filed at 05/07/2024 0910 Gross per 24 hour  Intake --  Output 2400 ml  Net -2400 ml   Filed Weights   04/30/24 2001 04/30/24 2015  Weight: 89.6 kg 89.6 kg     Examination: General: Chronically ill-appearing woman HENT: Oropharynx clear, no secretions Lungs: Decreased both bases, otherwise clear Cardiovascular: Regular, no murmur Abdomen: Obese, nondistended positive bowel sounds Extremities: No edema Neuro: Awake and alert, interacting appropriately and following commands.  She occasionally has some stuttering speech due to her MS.  Good strength in the upper extremities GU: Deferred  Resolved problem list   Assessment and Plan   Evaluation highly suspicious for malignancy with ring-enhancing brain lesions.  Unclear primary but consider thyroid  cancer or primary lung cancer.  The right middle lobe nodule is smaller than I would expect to be responsible for metastatic disease to the brain but certainly possible. -Optimally we would perform a PET scan to best identify possible sites for biopsy.  This is likely not possible as she is an inpatient with neurological symptoms and may need a diagnosis before an outpatient PET can be done -Agree with thyroid  ultrasound and any appropriate biopsy that is recommended after that study.  Await results - Most straightforward biopsy target may be the 1.9 cm left adrenal nodule.  Will need to assess radiologist suspicion that this could be metastatic disease.  If suspicion moderate to high then biopsy in IR should be considered. -The right middle lobe nodule should be reachable by robotic assisted navigational bronchoscopy.  Could consider performing under general anesthesia if no other amenable site to give a diagnosis.  This could potentially give a diagnosis without any need for brain biopsy although as mentioned the pulmonary nodule is small and does not easily explain the brain findings.  Difficult IV access, need for frequent labs - May need to consider PICC placement if there is no plan for port placement anytime soon  Labs   CBC: Recent Labs  Lab 04/30/24 2034 05/01/24 0804 05/04/24 0626  05/05/24 0627 05/06/24 1011  WBC 14.4* 10.2 7.0 8.8 9.6  NEUTROABS 11.9*  --   --   --   --   HGB 12.0 10.6* 9.2* 9.9* 10.9*  HCT 35.9* 32.3* 27.0* 29.5* 32.3*  MCV 90.4 92.0 90.3 91.0 91.2  PLT 258 232 203 225 239    Basic Metabolic Panel: Recent Labs  Lab 04/30/24 2034 05/01/24 0804 05/02/24 0251 05/03/24 0251 05/04/24 0626 05/05/24 0627 05/06/24 1011  NA 130*   < > 133* 136 138 140 139  K 6.1*   < > 4.9 4.6 4.5 4.2 4.6  CL 96*   < > 107 112* 112* 114* 110  CO2 18*   < > 14* 14* 16* 16* 17*  GLUCOSE 134*   < > 115* 190* 240* 184* 265*  BUN 79*   < > 74* 73* 69* 67* 53*  CREATININE 5.46*   < > 5.29* 4.29* 2.87* 2.15* 1.56*  CALCIUM 12.5*   < > 10.4* 9.3 8.6* 8.8* 9.0  MG 1.9  --   --   --   --  1.4* 2.0   < > = values in this interval not displayed.   GFR: Estimated Creatinine Clearance: 33.6 mL/min (A) (by C-G formula based on SCr of 1.56 mg/dL (H)). Recent Labs  Lab 05/01/24 0804 05/04/24 0626 05/05/24 0627 05/06/24 1011  WBC 10.2 7.0 8.8 9.6    Liver Function Tests: Recent Labs  Lab 04/30/24 2034 05/05/24 0627 05/06/24 1011  AST 35 23 46*  ALT 18 25 70*  ALKPHOS 38 32* 42  BILITOT 0.4 <0.2 0.2  PROT 6.3* 5.3* 6.0*  ALBUMIN  4.0 3.3* 3.5   No results for input(s): LIPASE, AMYLASE in the last 168 hours. No results for input(s): AMMONIA in the last 168 hours.  ABG    Component Value Date/Time   TCO2 22 02/28/2024 2249     Coagulation Profile: No results for input(s): INR, PROTIME in the last 168 hours.  Cardiac Enzymes: Recent Labs  Lab 04/30/24 2034  CKTOTAL 201    HbA1C: Hemoglobin A1C  Date/Time Value Ref Range Status  06/08/2020 12:00 AM 8.9  Final   Hgb A1c MFr Bld  Date/Time Value Ref Range Status  04/30/2024 12:22 AM 7.0 (H) 4.8 - 5.6 % Final    Comment:    (NOTE) Diagnosis of Diabetes The following HbA1c ranges recommended by the American Diabetes Association (ADA) may be used as an aid in the diagnosis of  diabetes mellitus.  Hemoglobin             Suggested A1C NGSP%              Diagnosis  <5.7                   Non Diabetic  5.7-6.4                Pre-Diabetic  >6.4                   Diabetic  <7.0                   Glycemic control for  adults with diabetes.    10/03/2020 12:02 PM 7.2 (H) <5.7 % of total Hgb Final    Comment:    For someone without known diabetes, a hemoglobin A1c value of 6.5% or greater indicates that they may have  diabetes and this should be confirmed with a follow-up  test. . For someone with known diabetes, a value <7% indicates  that their diabetes is well controlled and a value  greater than or equal to 7% indicates suboptimal  control. A1c targets should be individualized based on  duration of diabetes, age, comorbid conditions, and  other considerations. . Currently, no consensus exists regarding use of hemoglobin A1c for diagnosis of diabetes for children. .     CBG: Recent Labs  Lab 05/06/24 1157 05/06/24 1623 05/06/24 2034 05/07/24 0814 05/07/24 1134  GLUCAP 244* 192* 242* 216* 233*    Review of Systems:   As per HPI  Past Medical History:  She,  has a past medical history of Diabetes mellitus without complication (HCC) and MS (multiple sclerosis).   Surgical History:   Past Surgical History:  Procedure Laterality Date   ABDOMINAL HYSTERECTOMY     APPENDECTOMY     BOWEL RESECTION  1996   BREAST REDUCTION SURGERY  07/10/1986   CATARACT EXTRACTION, BILATERAL     12/21/19, 09/19.21   CHOLECYSTECTOMY  05/2004   DE QUERVAIN'S RELEASE  05/18/2014   RADIAL STYLOIDECTOMY WRIST Left 05/10/2012   REDUCTION MAMMAPLASTY     TRIGGER FINGER RELEASE Left 01/09/2011   UMBILICAL HERNIA REPAIR  08/10/2006     Social History:   reports that she has never smoked. She has never used smokeless tobacco. She reports that she does not drink alcohol and does not use drugs.   Family History:  Her family history  includes Breast cancer in her mother; Heart attack in her father.   Allergies Allergies[1]   Home Medications  Prior to Admission medications  Medication Sig Start Date End Date Taking? Authorizing Provider  HYDROcodone -acetaminophen  (NORCO) 7.5-325 MG tablet Take 0.5-1 tablets by mouth 2 (two) times daily as needed. 04/14/24  Yes Sater, Charlie LABOR, MD  levETIRAcetam  (KEPPRA ) 750 MG tablet Take 1 tablet (750 mg total) by mouth 2 (two) times daily. 03/02/24  Yes Sater, Charlie LABOR, MD  lisinopril  (ZESTRIL ) 10 MG tablet TAKE 1 TABLET(10 MG) BY MOUTH DAILY 07/13/22  Yes Duanne Butler DASEN, MD  loperamide  (IMODIUM ) 2 MG capsule Take 1 capsule (2 mg total) by mouth as needed for diarrhea or loose stools. 05/10/20  Yes [place], Theodoro FALCON, MD  metFORMIN  (GLUCOPHAGE ) 1000 MG tablet TAKE 1 TABLET(1000 MG) BY MOUTH TWICE DAILY WITH A MEAL 04/29/21  Yes Chandra Raisin A, NP  omeprazole (PRILOSEC) 20 MG capsule Take 20 mg by mouth daily. 07/07/21  Yes [provider]  pioglitazone  (ACTOS ) 45 MG tablet Take 1 tablet (45 mg total) by mouth daily. 01/27/21  Yes Chandra Raisin A, NP  Semaglutide , 1 MG/DOSE, (OZEMPIC , 1 MG/DOSE,) 2 MG/1.5ML SOPN Inject 1 mg into the skin once a week. 08/05/20  Yes Chandra Raisin LABOR, NP  simvastatin  (ZOCOR ) 40 MG tablet TAKE 1 TABLET(40 MG) BY MOUTH DAILY 08/11/21  Yes Duanne Butler DASEN, MD  tiZANidine  (ZANAFLEX ) 2 MG tablet Take 1 tablet (2 mg total) by mouth every 8 (eight) hours as needed for muscle spasms. 12/30/23  Yes Sater, Charlie LABOR, MD  desmopressin  (DDAVP ) 0.1 MG tablet Take 1 tablet (0.1 mg total) by mouth at bedtime. 06/01/23   Vear Charlie  A, MD  doxepin  (SINEQUAN ) 10 MG capsule Take 1 capsule (10 mg total) by mouth at bedtime. 12/09/23   Sater, Charlie LABOR, MD  LORazepam (ATIVAN) 1 MG tablet Take 1 mg by mouth every 3 (three) days. Patient not taking: Reported on 04/30/2024 02/04/24   [provider]     Critical care time: NA     Lamar Chris, MD,  PhD 05/07/2024, 4:38 PM [facility] [phone] or if no answer before 7:00PM call [phone] For any issues after 7:00PM please call eLink [phone]          [1]  Allergies Allergen Reactions   Latex Rash   "

## 2024-05-07 NOTE — Plan of Care (Signed)

## 2024-05-08 ENCOUNTER — Ambulatory Visit
Admission: RE | Admit: 2024-05-08 | Discharge: 2024-05-08 | Disposition: A | Discharge status: Home / Self Care | Source: Ambulatory Visit | Attending: Radiation Oncology | Admitting: Radiation Oncology

## 2024-05-08 DIAGNOSIS — I2510 Atherosclerotic heart disease of native coronary artery without angina pectoris: Secondary | ICD-10-CM | POA: Diagnosis not present

## 2024-05-08 DIAGNOSIS — I517 Cardiomegaly: Secondary | ICD-10-CM | POA: Insufficient documentation

## 2024-05-08 DIAGNOSIS — J90 Pleural effusion, not elsewhere classified: Secondary | ICD-10-CM | POA: Diagnosis not present

## 2024-05-08 DIAGNOSIS — H532 Diplopia: Secondary | ICD-10-CM | POA: Insufficient documentation

## 2024-05-08 DIAGNOSIS — G35D Multiple sclerosis, unspecified: Secondary | ICD-10-CM | POA: Diagnosis not present

## 2024-05-08 DIAGNOSIS — C7931 Secondary malignant neoplasm of brain: Secondary | ICD-10-CM | POA: Diagnosis not present

## 2024-05-08 DIAGNOSIS — Z794 Long term (current) use of insulin: Secondary | ICD-10-CM | POA: Diagnosis not present

## 2024-05-08 DIAGNOSIS — I700 Atherosclerosis of aorta: Secondary | ICD-10-CM | POA: Diagnosis not present

## 2024-05-08 DIAGNOSIS — G9389 Other specified disorders of brain: Secondary | ICD-10-CM | POA: Diagnosis not present

## 2024-05-08 DIAGNOSIS — J9811 Atelectasis: Secondary | ICD-10-CM | POA: Insufficient documentation

## 2024-05-08 DIAGNOSIS — R918 Other nonspecific abnormal finding of lung field: Secondary | ICD-10-CM | POA: Insufficient documentation

## 2024-05-08 DIAGNOSIS — E042 Nontoxic multinodular goiter: Secondary | ICD-10-CM | POA: Insufficient documentation

## 2024-05-08 DIAGNOSIS — Z803 Family history of malignant neoplasm of breast: Secondary | ICD-10-CM | POA: Insufficient documentation

## 2024-05-08 DIAGNOSIS — N179 Acute kidney failure, unspecified: Secondary | ICD-10-CM | POA: Diagnosis not present

## 2024-05-08 DIAGNOSIS — R569 Unspecified convulsions: Secondary | ICD-10-CM

## 2024-05-08 DIAGNOSIS — Z79899 Other long term (current) drug therapy: Secondary | ICD-10-CM | POA: Insufficient documentation

## 2024-05-08 DIAGNOSIS — R258 Other abnormal involuntary movements: Secondary | ICD-10-CM

## 2024-05-08 LAB — COMPREHENSIVE METABOLIC PANEL WITH GFR
ALT: 62 U/L — ABNORMAL HIGH (ref 0–44)
AST: 27 U/L (ref 15–41)
Albumin: 3.2 g/dL — ABNORMAL LOW (ref 3.5–5.0)
Alkaline Phosphatase: 37 U/L — ABNORMAL LOW (ref 38–126)
Anion gap: 9 (ref 5–15)
BUN: 38 mg/dL — ABNORMAL HIGH (ref 8–23)
CO2: 20 mmol/L — ABNORMAL LOW (ref 22–32)
Calcium: 8.8 mg/dL — ABNORMAL LOW (ref 8.9–10.3)
Chloride: 110 mmol/L (ref 98–111)
Creatinine, Ser: 1.16 mg/dL — ABNORMAL HIGH (ref 0.44–1.00)
GFR, Estimated: 49 mL/min — ABNORMAL LOW
Glucose, Bld: 227 mg/dL — ABNORMAL HIGH (ref 70–99)
Potassium: 4.5 mmol/L (ref 3.5–5.1)
Sodium: 139 mmol/L (ref 135–145)
Total Bilirubin: 0.2 mg/dL (ref 0.0–1.2)
Total Protein: 5.4 g/dL — ABNORMAL LOW (ref 6.5–8.1)

## 2024-05-08 LAB — CBC
HCT: 31.7 % — ABNORMAL LOW (ref 36.0–46.0)
Hemoglobin: 10.7 g/dL — ABNORMAL LOW (ref 12.0–15.0)
MCH: 30.6 pg (ref 26.0–34.0)
MCHC: 33.8 g/dL (ref 30.0–36.0)
MCV: 90.6 fL (ref 80.0–100.0)
Platelets: 232 K/uL (ref 150–400)
RBC: 3.5 MIL/uL — ABNORMAL LOW (ref 3.87–5.11)
RDW: 15.9 % — ABNORMAL HIGH (ref 11.5–15.5)
WBC: 9.6 K/uL (ref 4.0–10.5)
nRBC: 0 % (ref 0.0–0.2)

## 2024-05-08 LAB — HSV DNA BY PCR (REFERENCE LAB)
HSV 1 DNA: NEGATIVE
HSV 2 DNA: NEGATIVE

## 2024-05-08 LAB — GLUCOSE, CAPILLARY
Glucose-Capillary: 172 mg/dL — ABNORMAL HIGH (ref 70–99)
Glucose-Capillary: 203 mg/dL — ABNORMAL HIGH (ref 70–99)
Glucose-Capillary: 231 mg/dL — ABNORMAL HIGH (ref 70–99)
Glucose-Capillary: 264 mg/dL — ABNORMAL HIGH (ref 70–99)
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
## 2024-05-08 NOTE — Progress Notes (Signed)
 " Radiation Oncology         [phone] ________________________________  Initial inpatient Consultation  Name: Sonya Solomon MRN: [id number]  Date: 05/08/2024  DOB: [birth year]  RR:Yjffzm, Cheryle, MD  Vaslow, Zachary K, MD   REFERRING PHYSICIAN: Vaslow, Zachary K, MD  DIAGNOSIS:    ICD-10-CM   1. Metastasis to brain [facility])  C79.31     2. 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 female who presented with a history of multiple sclerosis and other comorbidities, who was admitted in late December with generalized weakness, dizziness, and fall.  According to the patient she has had numbness and reduced mobility in her right leg for several months..  She presented with bilateral diplopia as well  When the patient was admitted she was significantly dehydrated with poor p.o. intake and recent diarrhea.  After fluid resuscitation she underwent further workup.   MRI of her brain conducted on 05/06/2024 was personally reviewed by me and the central nervous system tumor board this morning.  It demonstrates approximately 45 enhancing nodules and ring-enhancing lesions within the brain.  The largest is 3.6 cm.  There is extensive cerebral white matter disease.  CT of her chest without contrast conducted on 05/07/2024 demonstrated an 18 mm nodular density laterally in the right middle lobe concerning for malignancy.  Biopsy pending  Should be noted that on October 16 she underwent MR imaging of  spine.  There was a ovoid intradural extramedullary soft tissue density at T11 that measured up to 12 mm of unknown etiology.  No evidence of cord compression at that time.  PREVIOUS RADIATION THERAPY: None reported by patient and none seen in the medical record  PAST MEDICAL HISTORY:  has a past medical history of  Diabetes mellitus without complication (HCC) and MS (multiple sclerosis).    PAST SURGICAL HISTORY: Past Surgical History:  Procedure Laterality Date   ABDOMINAL HYSTERECTOMY     APPENDECTOMY     BOWEL RESECTION  1996   BREAST REDUCTION SURGERY  07/10/1986   CATARACT EXTRACTION, BILATERAL     12/21/19, 09/19.21   CHOLECYSTECTOMY  05/2004   DE QUERVAIN'S RELEASE  05/18/2014   RADIAL STYLOIDECTOMY WRIST Left 05/10/2012   REDUCTION MAMMAPLASTY     TRIGGER FINGER RELEASE Left 01/09/2011   UMBILICAL HERNIA REPAIR  08/10/2006    FAMILY HISTORY: family history includes Breast cancer in her mother; Heart attack in her father.  SOCIAL HISTORY:  reports that she has never smoked. She has never used smokeless tobacco. She reports that she does not drink alcohol and does not use drugs.  ALLERGIES: Latex  MEDICATIONS:  No current facility-administered medications for this encounter.   No current outpatient medications on file.   Facility-Administered Medications Ordered in Other Encounters  Medication Dose Route Frequency Provider Last Rate Last Admin   0.9 %  sodium chloride  infusion   Intravenous Continuous Will Almarie MATSU, MD 50 mL/hr at 05/07/24 1810 Rate Change at 05/07/24 1810   acetaminophen  (TYLENOL ) tablet 500 mg  500 mg Oral Q6H PRN Hall, Carole N, DO   500 mg at 05/01/24 9066   ALPRAZolam  (XANAX ) tablet 0.25 mg  0.25 mg Oral TID PRN Mathews, Elizabeth G, MD   0.25 mg at 05/08/24 9487   Chlorhexidine  Gluconate Cloth 2 %  PADS 6 each  6 each Topical Daily Rojelio Nest, DO   6 each at 05/08/24 9047   desmopressin  (DDAVP ) tablet 0.1 mg  0.1 mg Oral QHS Will Almarie MATSU, MD   0.1 mg at 05/07/24 2120   dexamethasone  (DECADRON ) tablet 4 mg  4 mg Oral Q8H Will Almarie MATSU, MD   4 mg at 05/08/24 1458   docusate sodium  (COLACE) capsule 100 mg  100 mg Oral BID PRN Mathews, Elizabeth G, MD   100 mg at 05/07/24 1515   feeding supplement (ENSURE PLUS HIGH PROTEIN) liquid 237 mL   237 mL Oral BID BM Rojelio Nest, DO   237 mL at 05/08/24 1458   hydrALAZINE  (APRESOLINE ) injection 5 mg  5 mg Intravenous Q6H PRN Will Almarie MATSU, MD       insulin  aspart (novoLOG ) injection 0-5 Units  0-5 Units Subcutaneous QHS Shona Terry SAILOR, DO   2 Units at 05/07/24 2135   insulin  aspart (novoLOG ) injection 0-6 Units  0-6 Units Subcutaneous TID WC Shona Terry N, DO   2 Units at 05/08/24 1704   insulin  glargine (LANTUS ) injection 5 Units  5 Units Subcutaneous Daily Will Almarie MATSU, MD   5 Units at 05/08/24 9047   levETIRAcetam  (KEPPRA ) tablet 750 mg  750 mg Oral BID Mathews, Elizabeth G, MD   750 mg at 05/08/24 9048   melatonin tablet 5 mg  5 mg Oral QHS PRN Shona Terry N, DO   5 mg at 05/03/24 2248   pantoprazole  (PROTONIX ) EC tablet 40 mg  40 mg Oral Daily Shona Terry SAILOR, DO   40 mg at 05/08/24 0951   polyethylene glycol (MIRALAX  / GLYCOLAX ) packet 17 g  17 g Oral BID Will Almarie MATSU, MD   17 g at 05/07/24 1003   prochlorperazine  (COMPAZINE ) injection 5 mg  5 mg Intravenous Q6H PRN Shona Terry N, DO       simvastatin  (ZOCOR ) tablet 40 mg  40 mg Oral q1800 Shona Terry N, DO   40 mg at 05/08/24 1704    REVIEW OF SYSTEMS:  Notable for that above.   PHYSICAL EXAM:  vitals were not taken for this visit.   General: Alert and oriented, in no acute distress  -she is able to name the month, year, current president, city, self HEENT: Head is normocephalic. Extraocular movements are intact. Oropharynx is clear.  Skin: No concerning lesions. Musculoskeletal: Mildly reduced strength in the right arm, though right leg is significantly weak, she is not able to raise her right knee off of the bed Neurologic:  Speech is fluent.  See orientation above in general exam.  Face is symmetric.  No obvious cranial nerve deficits Psychiatric: Judgment and insight are intact. Affect is appropriate.  KPS = 50  100 - Normal; no complaints; no evidence of disease. 90   - Able to carry on normal  activity; minor signs or symptoms of disease. 80   - Normal activity with effort; some signs or symptoms of disease. 13   - Cares for self; unable to carry on normal activity or to do active work. 60   - Requires occasional assistance, but is able to care for most of his personal needs. 50   - Requires considerable assistance and frequent medical care. 40   - Disabled; requires special care and assistance. 30   - Severely disabled; hospital admission is indicated although death not imminent. 20   - Very sick; hospital admission necessary; active supportive treatment necessary.  10   - Moribund; fatal processes progressing rapidly. 0     - Dead  Karnofsky DA, Abelmann WH, Craver LS and Burchenal [facility] [phone]) The use of the nitrogen mustards in the palliative treatment of carcinoma: with particular reference to bronchogenic carcinoma Cancer 1 [phone]   ECOG = 3  0 - Asymptomatic (Fully active, able to carry on all predisease activities without restriction)  1 - Symptomatic but completely ambulatory (Restricted in physically strenuous activity but ambulatory and able to carry out work of a light or sedentary nature. For example, light housework, office work)  2 - Symptomatic, <50% in bed during the day (Ambulatory and capable of all self care but unable to carry out any work activities. Up and about more than 50% of waking hours)  3 - Symptomatic, >50% in bed, but not bedbound (Capable of only limited self-care, confined to bed or chair 50% or more of waking hours)  4 - Bedbound (Completely disabled. Cannot carry on any self-care. Totally confined to bed or chair)  5 - Death   Raylene MM, Creech RH, Tormey DC, et al. [phone]). Toxicity and response criteria of the [facility]. Am. DOROTHA Bridges. Oncol. 5 (6): 649-55   LABORATORY DATA:  Lab Results  Component Value Date   WBC 9.6 05/08/2024   HGB 10.7 (L) 05/08/2024   HCT 31.7 (L) 05/08/2024   MCV 90.6 05/08/2024   PLT 232  05/08/2024   CMP     Component Value Date/Time   NA 139 05/08/2024 0527   K 4.5 05/08/2024 0527   CL 110 05/08/2024 0527   CO2 20 (L) 05/08/2024 0527   GLUCOSE 227 (H) 05/08/2024 0527   BUN 38 (H) 05/08/2024 0527   CREATININE 1.16 (H) 05/08/2024 0527   CREATININE 1.13 (H) 08/26/2020 1148   CALCIUM 8.8 (L) 05/08/2024 0527   PROT 5.4 (L) 05/08/2024 0527   ALBUMIN  3.2 (L) 05/08/2024 0527   AST 27 05/08/2024 0527   ALT 62 (H) 05/08/2024 0527   ALKPHOS 37 (L) 05/08/2024 0527   BILITOT 0.2 05/08/2024 0527   EGFR 65.0 02/02/2024 0000   GFRNONAA 49 (L) 05/08/2024 0527   GFRNONAA 48 (L) 08/26/2020 1148         RADIOGRAPHY: US  THYROID  Result Date: 05/08/2024 CLINICAL DATA:  Incidental on CT. [id number] Multiple thyroid  nodules [id number] EXAM: THYROID  ULTRASOUND TECHNIQUE: Ultrasound examination of the thyroid  gland and adjacent soft tissues was performed. COMPARISON:  CT chest, 05/07/2024. FINDINGS: Parenchymal Echotexture: Normal Isthmus: 0.5 cm Right lobe: 4.7 x 3.0 x 2.7 cm Left lobe: 3.9 x 2.0 x 1.3 cm _________________________________________________________ Estimated total number of nodules >/= 1 cm: 1 Number of spongiform nodules >/=  2 cm not described below (TR1): 0 Number of mixed cystic and solid nodules >/= 1.5 cm not described below (TR2): 0 _________________________________________________________ Nodule # 1: Location: RIGHT; Mid Maximum size: 3.3 cm; Other 2 dimensions: 2.5 x 2.5 cm Composition: mixed cystic and solid (1) Echogenicity: anechoic (0) Shape: not taller-than-wide (0) Margins: ill-defined (0) Echogenic foci: none (0) ACR TI-RADS total points: 1. ACR TI-RADS risk category: TR1 (0-1 points). ACR TI-RADS recommendations: This nodule does NOT meet TI-RADS criteria for biopsy or dedicated follow-up. _________________________________________________________ No cervical adenopathy or abnormal fluid collection within the imaged neck. IMPRESSION: Solitary, 3.3 cm RIGHT mid TR-1 thyroid   nodule This nodule does NOT meet TI-RADS criteria for biopsy or dedicated follow-up. The above is in keeping with the ACR TI-RADS recommendations - J Am Coll Radiol 2017;14:587-595.  Electronically Signed   By: Thom Hall M.D.   On: 05/08/2024 07:06   CT CHEST WO CONTRAST Result Date: 05/07/2024 CLINICAL DATA:  Intracranial metastases. EXAM: CT CHEST WITHOUT CONTRAST TECHNIQUE: Multidetector CT imaging of the chest was performed following the standard protocol without IV contrast. RADIATION DOSE REDUCTION: This exam was performed according to the departmental dose-optimization program which includes automated exposure control, adjustment of the mA and/or kV according to patient size and/or use of iterative reconstruction technique. COMPARISON:  Yesterday FINDINGS: Cardiovascular: Aortic atherosclerosis. Mild cardiomegaly. No pericardial effusion. Coronary artery calcifications are noted. Mediastinum/Nodes: 3 cm right thyroid  nodule is noted. Esophagus and trachea are unremarkable. No definite adenopathy is noted, although evaluation is limited due to lack of intravenous contrast. Lungs/Pleura: No pneumothorax is noted. Small bilateral pleural effusions are noted with adjacent subsegmental atelectasis. 18 x 14 mm irregular nodular density is noted laterally in right middle lobe best seen in image number 106 of series 4, concerning for primary pulmonary malignancy. PET scan is recommended for further evaluation. Upper Abdomen: No acute abnormality. Musculoskeletal: No chest wall mass or suspicious bone lesions identified. IMPRESSION: 1. 18 x 14 mm irregular nodular density is noted laterally in right middle lobe concerning for primary pulmonary malignancy. PET scan is recommended for further evaluation. 2. Small bilateral pleural effusions are noted with adjacent subsegmental atelectasis. 3. 3 cm right thyroid  nodule is noted. Recommend thyroid  US . (Ref: J Am Coll Radiol. 2015 Feb;12(2): 143-50). 4. Coronary artery  calcifications are noted. 5. Aortic atherosclerosis. Aortic Atherosclerosis (ICD10-I70.0). Electronically Signed   By: Lynwood Landy Raddle M.D.   On: 05/07/2024 11:06   DG Chest 1 View Result Date: 05/06/2024 EXAM: 1 VIEW(S) XRAY OF THE CHEST 05/06/2024 09:00:00 AM COMPARISON: 05/04/2024 CLINICAL HISTORY: SOB (shortness of breath) FINDINGS: LUNGS AND PLEURA: Decreased interstitial edema compared with the previous exam. No focal pulmonary opacity. No pleural effusion. No pneumothorax. HEART AND MEDIASTINUM: Cardiomegaly. Aortic arch atherosclerosis. BONES AND SOFT TISSUES: No acute osseous abnormality. IMPRESSION: 1. No acute findings. 2. Resolved interstitial edema compared with the previous exam. 3. Cardiomegaly. Electronically signed by: Waddell Calk MD 05/06/2024 12:55 PM EST RP Workstation: [id number]   MR BRAIN W WO CONTRAST Result Date: 05/06/2024 EXAM: MRI BRAIN WITH AND WITHOUT CONTRAST 05/06/2024 08:57:23 AM TECHNIQUE: Multiplanar multisequence MRI of the head/brain was performed with and without the administration of 9 mL gadobutrol  (GADAVIST ) 1 MMOL/ML injection. COMPARISON: MRI of the head dated 04/20/2020 and CT of the head dated 05/05/2024. CLINICAL HISTORY: Diplopia. FINDINGS: BRAIN AND VENTRICLES: No acute infarct. No acute intracranial hemorrhage. No mass effect or midline shift. No hydrocephalus. The sella is unremarkable. Normal flow voids. There are numerous enhancing nodules and ring enhancing lesions within the cerebral and cerebellar hemispheres bilaterally, as demonstrated on the previous CT, which are highly suspicious for metastatic disease. The largest lesion is situated within the left parietal lobe, measuring approximately 3.2 x 2.0 x 3.6 cm. There are approximately 45 enhancing lesions within the cerebral and cerebellar hemispheres. There is a lesion within the left thalamus and left cerebral peduncle seen on image 16 of series 18, measuring approximately 14 x 11 x 14 mm. There is also  a lesion within the left basal ganglia measuring approximately 15 x 12 x 11 mm. None of the lesions demonstrate restricted diffusion. There is extensive cerebral white matter disease present. ORBITS: No acute abnormality. SINUSES: No acute abnormality. BONES AND SOFT TISSUES: Normal bone marrow signal and enhancement. No acute soft tissue abnormality. IMPRESSION:  1. Numerous (approximately 45) enhancing nodules and ring enhancing lesions within the cerebral and cerebellar hemispheres bilaterally, highly suspicious for metastatic disease, with the largest lesion in the left parietal lobe measuring approximately 3.2 x 2.0 x 3.6 cm. 2. Extensive cerebral white matter disease. Electronically signed by: Evalene Coho MD 05/06/2024 10:15 AM EST RP Workstation: [id number]   CT HEAD WO CONTRAST ( ) Result Date: 05/05/2024 EXAM: CT HEAD WITHOUT CONTRAST 05/05/2024 08:11:00 PM TECHNIQUE: CT of the head was performed without the administration of intravenous contrast. Automated exposure control, iterative reconstruction, and/or weight based adjustment of the mA/kV was utilized to reduce the radiation dose to as low as reasonably achievable. COMPARISON: MRI head 04/20/2020. CLINICAL HISTORY: Diplopia; Headache, sudden, severe. FINDINGS: BRAIN AND VENTRICLES: No acute hemorrhage. No evidence of acute infarct. No hydrocephalus. No extra-axial collection. No midline shift. Local mass effect is present associated with the dominant left parietal lesion. Multiple somewhat heterogeneous hyperattenuating lesions throughout the cerebral hemispheres concerning for intracranial metastatic disease. Dominant lesion in the high left parietal lobe near the vertex measures 2.6 x 2.1 x 2.9 cm. There is surrounding vasogenic edema and local mass effect. Additional 1.2 cm lesion in the right occipital lobe (series 2, image 14). 1.0 cm lesion in the left cerebral peduncle with edema in the midbrain also extending into the left thalamus.  There is a 1.4 cm hyperattenuating lesion in the right parietal lobe with possible adjacent satellite lesion and associated vasogenic edema. Additional lesions noted in the left frontal lobe and in the left basal ganglia. There are additional areas of hypoattenuation in the supratentorial white matter which may reflect a combination of chronic microvascular disease as well as additional areas of vasogenic edema. MRI of the brain with and without contrast is recommended for further evaluation of intracranial lesions. ORBITS: Bilateral lens replacement noted. SINUSES: No acute abnormality. SOFT TISSUES AND SKULL: No acute soft tissue abnormality. No skull fracture. IMPRESSION: 1. 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 Associated vasogenic edema and local mass effect. No midline shift. 2. MRI of the brain with and without contrast is recommended for further evaluation of the intracranial lesions. Electronically signed by: Donnice Mania MD 05/05/2024 08:25 PM EST RP Workstation: [id number]   DG Chest 1 View Result Date: 05/04/2024 CLINICAL DATA:  Shortness of breath. EXAM: CHEST  1 VIEW COMPARISON:  04/30/2024 FINDINGS: Lung volumes are low.The cardiomediastinal contours are stable. Bronchovascular crowding related to low lung volumes. No consolidation, pleural effusion, or pneumothorax. No acute osseous abnormalities are seen. IMPRESSION: Low lung volumes without acute abnormality. Electronically Signed   By: Andrea Gasman M.D.   On: 05/04/2024 14:15   US  RENAL Result Date: 05/04/2024 EXAM: US  Retroperitoneum Complete, Renal. 05/03/2024 03:06:29 PM TECHNIQUE: Real-time ultrasonography of the retroperitoneum renal was performed. COMPARISON: None available CLINICAL HISTORY: Acute kidney injury, ascites. FINDINGS: FINDINGS: RIGHT KIDNEY/URETER: Right kidney measures 11.7 x 5.0 x 5.1 cm. The right renal parenchyma is isoechoic to  the liver. No hydronephrosis. No calculus. No mass. LEFT KIDNEY/URETER: Left kidney measures 10.0 x 5.6 x 5.1 cm. The left renal parenchyma is isoechoic to the spleen. No hydronephrosis. No calculus. No mass. BLADDER: A foley catheter is in place. Gallbladder is collapsed. IMPRESSION: 1. The renal parenchyma is somewhat hyperechoic bilaterally. This is nonspecific, but can be seen in the setting of medical renal disease. 2. Foley catheter in situ. Electronically signed by: Lonni Necessary MD 05/04/2024 08:52 AM EST RP Workstation: [id number]   ECHOCARDIOGRAM  COMPLETE Result Date: 05/01/2024    ECHOCARDIOGRAM REPORT   Patient Name:   Sonya Solomon Date of Exam: 05/01/2024 Medical Rec #:  [id number]       Height:       64.5 in Accession #:    [id number]      Weight:       197.5 lb Date of Birth:  Feb 28, 1946        BSA:          1.956 m Patient Age:    78 years        BP:           97/69 mmHg Patient Gender: F               HR:           73 bpm. Exam Location:  Inpatient Procedure: 2D Echo, Cardiac Doppler, Color Doppler and Intracardiac            Opacification Agent (Both Spectral and Color Flow Doppler were            utilized during procedure). Indications:    Elevated troponin  History:        Patient has no prior history of Echocardiogram examinations.                 Risk Factors:Hypertension and Diabetes.  Sonographer:    Merlynn Manas Referring Phys: [id number] TERRY LOISE HURST  Sonographer Comments: Image acquisition challenging due to respiratory motion. IMPRESSIONS  1. Significant LV hypertrophy, especially apically. 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 CMR may be helpful for further evaluation. Left ventricular ejection fraction, by estimation, is 70 to 75%. The left ventricle has hyperdynamic function. The left ventricle has no regional wall motion abnormalities. There is moderate concentric left ventricular hypertrophy. Left ventricular diastolic parameters are consistent with Grade I  diastolic dysfunction (impaired relaxation).  2. Right ventricular systolic function is normal. The right ventricular size is normal. Mildly increased right ventricular wall thickness. There is severely elevated pulmonary artery systolic pressure. The estimated right ventricular systolic pressure is 68.0 mmHg.  3. Left atrial size was mildly dilated.  4. The mitral valve is normal in structure. Mild mitral valve regurgitation. No evidence of mitral stenosis.  5. The aortic valve is normal in structure. Aortic valve regurgitation is not visualized. Mild aortic valve stenosis. Aortic valve Vmax measures 2.24 m/s.  6. The inferior vena cava is normal in size with greater than 50% respiratory variability, suggesting right atrial pressure of 3 mmHg. FINDINGS  Left Ventricle: Significant LV hypertrophy, especially apically. 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 CMR may be helpful for further evaluation. Left ventricular ejection fraction, by estimation, is 70 to 75%. The left ventricle has hyperdynamic function. The left ventricle has no regional wall motion abnormalities. Definity  contrast agent was given IV to delineate the left ventricular endocardial borders. The left ventricular internal cavity size was normal in size. There is moderate concentric left ventricular hypertrophy. Left ventricular diastolic parameters are consistent with Grade I diastolic dysfunction (impaired relaxation). Right Ventricle: The right ventricular size is normal. Mildly increased right ventricular wall thickness. Right ventricular systolic function is normal. There is severely elevated pulmonary artery systolic pressure. The tricuspid regurgitant velocity is 4.03 m/s, and with an assumed right atrial pressure of 3 mmHg, the estimated right ventricular systolic pressure is 68.0 mmHg. Left Atrium: Left atrial size was mildly dilated. Right Atrium: Right atrial size was normal in  size. Pericardium: There is no evidence of  pericardial effusion. Mitral Valve: The mitral valve is normal in structure. Mild mitral valve regurgitation. No evidence of mitral valve stenosis. Tricuspid Valve: The tricuspid valve is normal in structure. Tricuspid valve regurgitation is mild . No evidence of tricuspid stenosis. Aortic Valve: The aortic valve is normal in structure. Aortic valve regurgitation is not visualized. Mild aortic stenosis is present. Aortic valve mean gradient measures 10.0 mmHg. Aortic valve peak gradient measures 20.1 mmHg. Aortic valve area, by VTI measures 2.32 cm. Pulmonic Valve: The pulmonic valve was normal in structure. Pulmonic valve regurgitation is not visualized. No evidence of pulmonic stenosis. Aorta: The aortic root is normal in size and structure. Venous: The inferior vena cava is normal in size with greater than 50% respiratory variability, suggesting right atrial pressure of 3 mmHg. IAS/Shunts: No atrial level shunt detected by color flow Doppler.  LEFT VENTRICLE PLAX 2D LVIDd:         5.00 cm   Diastology LVIDs:         2.90 cm   LV e' medial:    4.90 cm/s LV PW:         0.90 cm   LV E/e' medial:  11.9 LV IVS:        0.90 cm   LV e' lateral:   7.18 cm/s LVOT diam:     2.00 cm   LV E/e' lateral: 8.1 LV SV:         99 LV SV Index:   50 LVOT Area:     3.14 cm  RIGHT VENTRICLE             IVC RV Basal diam:  3.70 cm     IVC diam: 1.60 cm RV S prime:     18.60 cm/s TAPSE (M-mode): 2.5 cm      PULMONARY VEINS                             Diastolic Velocity: 51.40 cm/s                             S/D Velocity:       1.40                             Systolic Velocity:  71.10 cm/s LEFT ATRIUM             Index        RIGHT ATRIUM           Index LA diam:        3.90 cm 1.99 cm/m   RA Area:     15.60 cm LA Vol (A2C):   52.0 ml 26.58 ml/m  RA Volume:   42.70 ml  21.82 ml/m LA Vol (A4C):   62.2 ml 31.79 ml/m LA Biplane Vol: 59.4 ml 30.36 ml/m  AORTIC VALVE AV Area (Vmax):    2.64 cm AV Area (Vmean):   2.95 cm AV Area  (VTI):     2.32 cm AV Vmax:           224.00 cm/s AV Vmean:          148.000 cm/s AV VTI:            0.425 m AV Peak Grad:      20.1 mmHg AV Mean Grad:  10.0 mmHg LVOT Vmax:         188.00 cm/s LVOT Vmean:        139.000 cm/s LVOT VTI:          0.314 m LVOT/AV VTI ratio: 0.74  AORTA Ao Root diam: 3.50 cm Ao Asc diam:  3.80 cm MITRAL VALVE               TRICUSPID VALVE MV Area (PHT): 3.14 cm    TR Peak grad:   65.0 mmHg MV E velocity: 58.10 cm/s  TR Vmax:        403.00 cm/s MV A velocity: 85.70 cm/s MV E/A ratio:  0.68        SHUNTS                            Systemic VTI:  0.31 m                            Systemic Diam: 2.00 cm Morene Brownie Electronically signed by Morene Brownie Signature Date/Time: 05/01/2024/1:42:17 PM    Final    CT ABDOMEN PELVIS WO CONTRAST Result Date: 04/30/2024 EXAM: CT ABDOMEN AND PELVIS WITHOUT CONTRAST 04/30/2024 09:16:11 PM TECHNIQUE: CT of the abdomen and pelvis was performed without the administration of intravenous contrast. Multiplanar reformatted images are provided for review. Automated exposure control, iterative reconstruction, and/or weight-based adjustment of the mA/kV was utilized to reduce the radiation dose to as low as reasonably achievable. COMPARISON: None available. CLINICAL HISTORY: Bowel obstruction suspected; Sepsis. 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 LIVER: The liver is unremarkable. GALLBLADDER AND BILE DUCTS: Gallbladder is not visualized. No biliary ductal dilatation. SPLEEN: No acute abnormality. PANCREAS: No acute abnormality. ADRENAL GLANDS: There is an intramural left adrenal nodule measuring 1.9 x 1.4 cm. KIDNEYS, URETERS AND BLADDER: No stones in the kidneys or ureters. No hydronephrosis. No perinephric or periureteral stranding. Urinary bladder is unremarkable. GI AND BOWEL: Stomach demonstrates no acute abnormality. Sigmoid colon anastomosis is present. There is sigmoid and descending colon  diverticulosis. There is circumferential wall thickening of the entire colon more significant proximally. There is mild inflammatory stranding surrounding the ascending colon. The appendix was not definitively visualized. There is no bowel obstruction. PERITONEUM AND RETROPERITONEUM: No ascites. No free air. VASCULATURE: Aorta is normal in caliber. There are atherosclerotic calcifications of the aorta and iliac arteries. LYMPH NODES: No lymphadenopathy. REPRODUCTIVE ORGANS: Uterus is surgically absent. BONES AND SOFT TISSUES: No acute osseous abnormality. No focal soft tissue abnormality. IMPRESSION: 1. Pancolitis, greatest proximally. 2. Sigmoid and descending colon diverticulosis without diverticulitis. 3. Indeterminate 1.9 cm left adrenal nodule; recommend 1-year follow-up adrenal washout CT. 4. Partially imaged intraatrial lipoma. Electronically signed by: Greig Pique MD 04/30/2024 10:06 PM EST RP Workstation: [id number]   DG Chest Port 1 View Result Date: 04/30/2024 EXAM: 1 VIEW(S) XRAY OF THE CHEST 04/30/2024 08:28:32 PM COMPARISON: None available. CLINICAL HISTORY: weakness, fall FINDINGS: LUNGS AND PLEURA: No focal pulmonary opacity. No pleural effusion. No pneumothorax. HEART AND MEDIASTINUM: Aortic arch calcifications. BONES AND SOFT TISSUES: Multilevel thoracic osteophytosis. No acute osseous abnormality. IMPRESSION: 1. No acute cardiopulmonary abnormality. Electronically signed by: Morgane Naveau MD 04/30/2024 09:12 PM EST RP Workstation: [id number]      IMPRESSION/PLAN: Metastasis to brain [facility])  Secondary malignant neoplasm of brain (HCC) Today, I talked to the patient about the findings and work-up thus far.  We discussed the patient's diagnosis of putative brain metastases and general treatment for this, highlighting the role of radiotherapy in the management.  We discussed the available radiation techniques, and focused on the details of logistics and delivery.     At our central  nervous system tumor board the consensus this morning was that diagnostic tissue is needed to prove that this is a malignant process.  This is certainly our suspicion, but we do need to rule out other etiologies such as toxoplasmosis or  other rare conditions.  Lung biopsy is pending  Considering that there is a high likelihood that this is a neoplastic process, I discussed my  recommendation that she undergo palliative whole brain radiation therapy if we determine that this is cancer. We discussed the risks, benefits, and side effects of whole brain radiotherapy. Side effects may include but not necessarily be limited to: Hair loss, headache, fatigue, neurologic decline which could be permanent in nature.  No guarantees of treatment were given.  We discussed why the potential benefits of whole brain radiation for numerous brain metastases would outweigh the potential risks of whole brain radiation therapy.  A consent form was signed and placed in the patient's medical record.  Our team will track her continued workup in the hospital and be available if whole brain radiation therapy is in fact indicated. The patient was encouraged to ask questions that I answered to the best of my ability.  Emotional support was given today.  I look forward to participating in her care as needed.    On date of service, in total, I spent 60 minutes on this encounter. Patient was seen in person.   __________________________________________   Lauraine Golden, MD   "

## 2024-05-08 NOTE — Progress Notes (Signed)
 PT Cancellation Note  Patient Details Name: Sonya Solomon MRN: [id number] DOB: 1945/10/27   Cancelled Treatment:    Reason Eval/Treat Not Completed:  Attempted PT tx session this PM. Pt declined participating stating that she doesn't trust anyone to touch her and states a lot has happened since her initial evaluation. She was not amenable to any explanation/reasoning at this time. PT will sign off per patient's request. No therapy goals have been met. If pt changes her mind and wishes to work with therapy, please place new orders and we will re-evaluate. Thanks.    Dannial SQUIBB, PT Acute Rehabilitation  Office: [phone]

## 2024-05-08 NOTE — Progress Notes (Signed)
 NEUROLOGY CONSULT FOLLOW UP NOTE   Date of service: May 08, 2024 Patient Name: Sonya Solomon MRN:  [id number] DOB:  Jul 17, 1945  Interval Hx/subjective   She reports some benefit from the steroids  Vitals   Vitals:   05/07/24 1413 05/07/24 2128 05/08/24 0457 05/08/24 1259  BP: (!) 174/92 132/66 (!) 144/70 (!) 142/72  Pulse: 66 (!) 57 (!) 51 69  Resp: 15 18 18 20   Temp: 97.9 F (36.6 C) 97.6 F (36.4 C) 98.2 F (36.8 C) 97.6 F (36.4 C)  TempSrc:  Oral Oral   SpO2: 97% 95% 98% 96%  Weight:      Height:         Body mass index is 33.38 kg/m.  Physical Exam   Constitutional: Appears well-developed and well-nourished.  Neurologic Examination    MS: Awake, gives the year is 2006, unable to give the month.  She does know that she is in the hospital but cannot, with the name. CN: EOMI, mild right facial weakness Motor: She has right leg >arm weakness which she states has been present for quite some time Mild nonsustained clonus in the right leg  Medications Current Medications[1]  Labs and Diagnostic Imaging   Creatinine 1.16  Imaging(Personally reviewed): MRI brain - multifocal metastatic disease  Assessment   Sonya Solomon is a 79 y.o. female with multiple lesions concerning for metastatic disease.  The appearance I think is more consistent with metastatic disease than some type of inflammatory lesion such as ADEM and she is very much not in the demographic I would expect this in.  She has had recurrent episodes of right leg twitching going back to May for which she has been started on Keppra  and I think with her current imaging, it does raise my suspicion that these are indeed seizures, but she does have nonsustained clonus and it is possible that some of the twitching she describes could also be related to that.  Recommendations  Continue Decadron , I would decrease to 4 mg 3 times daily tomorrow and then a few days later would consider further decreasing to  4 mg twice daily if she is tolerating the decrease. Continue levetiracetam  750 twice daily Agree with efforts to find extracerebral primary, if no clear source is found then may need to discuss with neurosurgery for biopsy. Neurology will continue to be available as needed, please call with further questions or concerns. ______________________________________________________________________   Bonney Aisha Seals, MD [facility]      [1]  Current Facility-Administered Medications:    0.9 %  sodium chloride  infusion, , Intravenous, Continuous, Will Almarie MATSU, MD, Last Rate: 50 mL/hr at 05/07/24 1810, Rate Change at 05/07/24 1810   acetaminophen  (TYLENOL ) tablet 500 mg, 500 mg, Oral, Q6H PRN, Shona Terry SAILOR, DO, 500 mg at 05/01/24 9066   ALPRAZolam  (XANAX ) tablet 0.25 mg, 0.25 mg, Oral, TID PRN, Will Almarie MATSU, MD, 0.25 mg at 05/08/24 9487   Chlorhexidine  Gluconate Cloth 2 % PADS 6 each, 6 each, Topical, Daily, Rojelio Nest, DO, 6 each at 05/08/24 9047   desmopressin  (DDAVP ) tablet 0.1 mg, 0.1 mg, Oral, QHS, Will Almarie MATSU, MD, 0.1 mg at 05/07/24 2120   dexamethasone  (DECADRON ) tablet 4 mg, 4 mg, Oral, Q8H, Will Almarie MATSU, MD, 4 mg at 05/08/24 9487   docusate sodium  (COLACE) capsule 100 mg, 100 mg, Oral, BID PRN, Will Almarie MATSU, MD, 100 mg at 05/07/24 1515   feeding supplement (ENSURE PLUS HIGH PROTEIN) liquid 237 mL, 237  mL, Oral, BID BM, Rojelio Nest, DO, 237 mL at 05/08/24 9047   heparin  injection 5,000 Units, 5,000 Units, Subcutaneous, Q8H, Shona Terry SAILOR, DO, 5,000 Units at 05/08/24 9487   hydrALAZINE  (APRESOLINE ) injection 5 mg, 5 mg, Intravenous, Q6H PRN, Will Almarie MATSU, MD   insulin  aspart (novoLOG ) injection 0-5 Units, 0-5 Units, Subcutaneous, QHS, Hall, Carole N, DO, 2 Units at 05/07/24 2135   insulin  aspart (novoLOG ) injection 0-6 Units, 0-6 Units, Subcutaneous, TID WC, Shona Terry N, DO, 2 Units at 05/08/24 1259    insulin  glargine (LANTUS ) injection 5 Units, 5 Units, Subcutaneous, Daily, Will Almarie MATSU, MD, 5 Units at 05/08/24 9047   levETIRAcetam  (KEPPRA ) tablet 750 mg, 750 mg, Oral, BID, Will Almarie MATSU, MD, 750 mg at 05/08/24 0951   melatonin tablet 5 mg, 5 mg, Oral, QHS PRN, Shona Terry N, DO, 5 mg at 05/03/24 2248   pantoprazole  (PROTONIX ) EC tablet 40 mg, 40 mg, Oral, Daily, Shona Terry N, DO, 40 mg at 05/08/24 9048   polyethylene glycol (MIRALAX  / GLYCOLAX ) packet 17 g, 17 g, Oral, BID, Will Almarie MATSU, MD, 17 g at 05/07/24 1003   prochlorperazine  (COMPAZINE ) injection 5 mg, 5 mg, Intravenous, Q6H PRN, Shona, Carole N, DO   simvastatin  (ZOCOR ) tablet 40 mg, 40 mg, Oral, q1800, Hall, Carole N, DO, 40 mg at 05/07/24 1800

## 2024-05-08 NOTE — Progress Notes (Signed)
 Patient ID: Sonya Solomon, female   DOB: Sep 04, 1945, 79 y.o.   MRN: [id number] Reviewed MRI... patient has very large amount of ring enhancing lesions consistent with metastatic disease with unknown primary.  Largest lesion is in the motor strip on left and is relatively high risk for biopsy, other lesions may be too small for biopsy.  Would exhaust all efforts to obtain tissue from other sources.  I have discussed with our cancer team, who said this patient was discussed in tumor board this am and they agreed going pulmonary nodule first, if its non-diagnostic, then we can consider brain biopsy.

## 2024-05-08 NOTE — Progress Notes (Signed)
 " PROGRESS NOTE    Sonya Solomon  FMW:[id number] DOB: Feb 09, 1946 DOA: 04/30/2024 PCP: Leonel Cole, MD  Brief Narrative: 79 y.o. female with medical history significant for MS, type 2 diabetes, hyperlipidemia, hypertension, obesity, history of gait disturbance, vertigo, urinary urgency, spasticity, insomnia, who presents to the ER due to generalized weakness, dizziness, and a fall.  She was on the floor for about 4 hours before the husband could get help and get her off the floor.  She has been having diarrhea for few days prior to admission.  In the ED her systolic blood pressure was in the 70s diastolic in the 40s.  She was found to have an AKI on CKD stage III A with a creatinine of 5.4 and admitted for the same.  At baseline she walks with a walker and a cane independent with all ADLs but does not cook or clean the house.  She lives at home with her husband.   Assessment & Plan:   Principal Problem:   AKI (acute kidney injury) Active Problems:   MS (multiple sclerosis)   Hyperlipidemia associated with type 2 diabetes mellitus (HCC)   CKD stage 3a, GFR 45-59 ml/min (HCC)   Hyperkalemia   Demand ischemia (HCC)   Pyuria   Diarrhea   Seizure (HCC)   HTN (hypertension)   Pulmonary nodule 1 cm or greater in diameter  #1Bilateral diplopia patient with history of MS started complaining of bilateral diplopia during the hospitalization.  A stat CT head showed findings concerning for multiple metastasis. MRI brain 05/06/2024 shows 45 enhancing nodules and ring-enhancing lesions within the cerebral and cerebellar hemispheres bilaterally highly suspicious for metastatic disease with the largest lesion in the left parietal lobe 3.2 x 2 x 3.6 cm and extensive cerebral white matter disease. CT chest 05/07/2024 18 x 14 mm irregular nodular density is noted laterally in right middle lobe concerning for primary pulmonary malignancy. PET scan is recommended for further evaluation. Small bilateral pleural  effusions are noted with adjacent subsegmental atelectasis.3 cm right thyroid  nodule is noted. Recommend thyroid  US . Coronary artery calcifications are noted.Aortic atherosclerosis. CT abdomen and pelvis 04/30/2024 Pancolitis, greatest proximally.Sigmoid and descending colon diverticulosis without diverticulitis. Indeterminate 1.9 cm left adrenal nodule; recommend 1-year follow-up adrenal washout CT.Partially imaged intraatrial lipoma  Continue Decadron  and prior to admission Keppra  Follow-up thyroid  ultrasound PCCM following  #2 AKI on CKD stage IIIa baseline creatinine is around 1.1.  She was found to have a creatinine of 5.4 on admission due to dehydration poor p.o. intake and diarrhea and hypotension.  She was treated with IV fluids.  She was also given midodrine  and Solu-Cortef  initially which has been stopped since her blood pressure has been improved and now on the higher side.  Creatinine is 1.56 from 2.87 from 4.2 from 5.4 on admission.  Renal ultrasound shows some evidence of medical renal disease with no obstruction.    #3 hypotension resolved with fluids midodrine  and Solu-Cortef    #4 E. coli UTI, POA completed treatment with Rocephin .    #5 hyperkalemia resolved  #6 hypomagnesemia resolved after repletion   #7 demand ischemia with NSVT SVT- - Troponin trend relatively flat 126, 141, 169 Cardiology was consulted.  There was a concern for apical hypertrophy question HOCM? Cardiology saw the patient and recommended outpatient follow-up.  They were not in favor of giving Coreg  or beta-blockers at this time. - Echocardiogram negative for regional wall motion abnormality, EF 70 to 75%, grade 1 diastolic dysfunction, elevated pulmonary artery systolic  pressure, LV hypertrophy severe?       #8 constipation MiraLAX  and senna and Dulcolax ordered she was admitted with diarrhea initially.  #9 seizure disorder Continue Keppra  dose increased since renal functions improving   #10 MS  followed by Dr. Vear neurology as an outpatient.  Seen by PT OT recommending SNF.  CIR saw the patient did not think she is a candidate for CIR.   #11 hypertension continue hydralazine  as needed lisinopril  on hold due to AKI will likely resume tomorrow  #12 diabetes mellitus with hyperglycemia on Decadron  -on metformin  Actos  and Ozempic  prior to admission. - A1c 7.0 - Sliding scale insulin  -Will start low-dose Lantus  CBG (last 3)  Recent Labs    05/07/24 1637 05/07/24 2124 05/08/24 0807  GLUCAP 198* 247* 172*   Hyperlipidemia continue Zocor   Estimated body mass index is 33.38 kg/m as calculated from the following:   Height as of this encounter: 5' 4.5 (1.638 m).   Weight as of this encounter: 89.6 kg.  DVT prophylaxis: Heparin   code Status: Full code  family Communication: with patient's husband on the phone  disposition Plan:  Status is: Inpatient Remains inpatient appropriate because: Persistent AKI   Consultants:  None  Procedures: None Antimicrobials:-Finished a course of Rocephin   Subjective:  IR does not think they are able to do a biopsy await pulmonary decision and reports she does not see double anymore has a Foley in place does not want that taken out, refused to have it taken out few days ago Had bowel movements after stool softeners no nausea vomiting very weak  Objective: Vitals:   05/07/24 0600 05/07/24 1413 05/07/24 2128 05/08/24 0457  BP: (!) 128/58 (!) 174/92 132/66 (!) 144/70  Pulse: (!) 58 66 (!) 57 (!) 51  Resp:  15 18 18   Temp: 98 F (36.7 C) 97.9 F (36.6 C) 97.6 F (36.4 C) 98.2 F (36.8 C)  TempSrc: Oral  Oral Oral  SpO2: 98% 97% 95% 98%  Weight:      Height:        Intake/Output Summary (Last 24 hours) at 05/08/2024 1227 Last data filed at 05/08/2024 0405 Gross per 24 hour  Intake --  Output 2425 ml  Net -2425 ml   Filed Weights   04/30/24 2001 04/30/24 2015  Weight: 89.6 kg 89.6 kg    Examination:  General exam: Appears   anxious tearful Respiratory system: Clear to auscultation. Respiratory effort normal.  Diminished breath sounds Cardiovascular system:reg  Gastrointestinal system: Abdomen is nondistended, soft and nontender. No organomegaly or masses felt. Normal bowel sounds heard. Central nervous system: Alert and oriented. Extremities: 1+ bilateral feet edema peers to be chronic    Data Reviewed: I have personally reviewed following labs and imaging studies  CBC: Recent Labs  Lab 05/04/24 0626 05/05/24 0627 05/06/24 1011 05/07/24 1820 05/08/24 0527  WBC 7.0 8.8 9.6 10.5 9.6  HGB 9.2* 9.9* 10.9* 11.2* 10.7*  HCT 27.0* 29.5* 32.3* 32.8* 31.7*  MCV 90.3 91.0 91.2 89.4 90.6  PLT 203 225 239 263 232   Basic Metabolic Panel: Recent Labs  Lab 05/04/24 0626 05/05/24 0627 05/06/24 1011 05/07/24 1820 05/08/24 0527  NA 138 140 139 139 139  K 4.5 4.2 4.6 4.4 4.5  CL 112* 114* 110 109 110  CO2 16* 16* 17* 19* 20*  GLUCOSE 240* 184* 265* 209* 227*  BUN 69* 67* 53* 42* 38*  CREATININE 2.87* 2.15* 1.56* 1.32* 1.16*  CALCIUM 8.6* 8.8* 9.0 9.0 8.8*  MG  --  1.4* 2.0  --   --    GFR: Estimated Creatinine Clearance: 45.2 mL/min (A) (by C-G formula based on SCr of 1.16 mg/dL (H)). Liver Function Tests: Recent Labs  Lab 05/05/24 0627 05/06/24 1011 05/07/24 1820 05/08/24 0527  AST 23 46* 38 27  ALT 25 70* 67* 62*  ALKPHOS 32* 42 40 37*  BILITOT <0.2 0.2 0.2 0.2  PROT 5.3* 6.0* 5.7* 5.4*  ALBUMIN  3.3* 3.5 3.4* 3.2*   No results for input(s): LIPASE, AMYLASE in the last 168 hours. No results for input(s): AMMONIA in the last 168 hours. Coagulation Profile: Recent Labs  Lab 05/07/24 1820  INR 1.0   Cardiac Enzymes: No results for input(s): CKTOTAL, CKMB, CKMBINDEX, TROPONINI in the last 168 hours.  BNP (last 3 results) No results for input(s): PROBNP in the last 8760 hours. HbA1C: No results for input(s): HGBA1C in the last 72 hours. CBG: Recent Labs  Lab  05/07/24 0814 05/07/24 1134 05/07/24 1637 05/07/24 2124 05/08/24 0807  GLUCAP 216* 233* 198* 247* 172*   Lipid Profile: No results for input(s): CHOL, HDL, LDLCALC, TRIG, CHOLHDL, LDLDIRECT in the last 72 hours. Thyroid  Function Tests: No results for input(s): TSH, T4TOTAL, FREET4, T3FREE, THYROIDAB in the last 72 hours. Anemia Panel: No results for input(s): VITAMINB12, FOLATE, FERRITIN, TIBC, IRON, RETICCTPCT in the last 72 hours. Sepsis Labs: No results for input(s): PROCALCITON, LATICACIDVEN in the last 168 hours.  Recent Results (from the past 240 hours)  Urine Culture     Status: Abnormal   Collection Time: 04/30/24  8:24 PM   Specimen: Urine, Random  Result Value Ref Range Status   Specimen Description   Final    URINE, RANDOM Performed at [facility], [address], [place], [place] [address]    Special Requests   Final    NONE Reflexed from [id number] Performed at [facility], [address] [place], [place] [address]    Culture >=100,000 COLONIES/mL ESCHERICHIA COLI (A)  Final   Report Status 05/03/2024 FINAL  Final   Organism ID, Bacteria ESCHERICHIA COLI (A)  Final      Susceptibility   Escherichia coli - MIC*    AMPICILLIN >=32 RESISTANT Resistant     CEFAZOLIN (URINE) Value in next row Sensitive      8 SENSITIVEThis is a modified FDA-approved test that has been validated and its performance characteristics determined by the reporting laboratory.  This laboratory is certified under the Clinical Laboratory Improvement Amendments CLIA as qualified to perform high complexity clinical laboratory testing.    CEFEPIME Value in next row Sensitive      8 SENSITIVEThis is a modified FDA-approved test that has been validated and its performance characteristics determined by the reporting laboratory.  This laboratory is certified under the Clinical Laboratory Improvement Amendments CLIA as qualified  to perform high complexity clinical laboratory testing.    ERTAPENEM Value in next row Sensitive      8 SENSITIVEThis is a modified FDA-approved test that has been validated and its performance characteristics determined by the reporting laboratory.  This laboratory is certified under the Clinical Laboratory Improvement Amendments CLIA as qualified to perform high complexity clinical laboratory testing.    CEFTRIAXONE  Value in next row Sensitive      8 SENSITIVEThis is a modified FDA-approved test that has been validated and its performance characteristics determined by the reporting laboratory.  This laboratory is certified under the Clinical Laboratory Improvement Amendments CLIA as  qualified to perform high complexity clinical laboratory testing.    CIPROFLOXACIN Value in next row Sensitive      8 SENSITIVEThis is a modified FDA-approved test that has been validated and its performance characteristics determined by the reporting laboratory.  This laboratory is certified under the Clinical Laboratory Improvement Amendments CLIA as qualified to perform high complexity clinical laboratory testing.    GENTAMICIN Value in next row Sensitive      8 SENSITIVEThis is a modified FDA-approved test that has been validated and its performance characteristics determined by the reporting laboratory.  This laboratory is certified under the Clinical Laboratory Improvement Amendments CLIA as qualified to perform high complexity clinical laboratory testing.    NITROFURANTOIN Value in next row Sensitive      8 SENSITIVEThis is a modified FDA-approved test that has been validated and its performance characteristics determined by the reporting laboratory.  This laboratory is certified under the Clinical Laboratory Improvement Amendments CLIA as qualified to perform high complexity clinical laboratory testing.    TRIMETH/SULFA Value in next row Sensitive      8 SENSITIVEThis is a modified FDA-approved test that has been  validated and its performance characteristics determined by the reporting laboratory.  This laboratory is certified under the Clinical Laboratory Improvement Amendments CLIA as qualified to perform high complexity clinical laboratory testing.    AMPICILLIN/SULBACTAM Value in next row Resistant      8 SENSITIVEThis is a modified FDA-approved test that has been validated and its performance characteristics determined by the reporting laboratory.  This laboratory is certified under the Clinical Laboratory Improvement Amendments CLIA as qualified to perform high complexity clinical laboratory testing.    PIP/TAZO Value in next row Sensitive      <=4 SENSITIVEThis is a modified FDA-approved test that has been validated and its performance characteristics determined by the reporting laboratory.  This laboratory is certified under the Clinical Laboratory Improvement Amendments CLIA as qualified to perform high complexity clinical laboratory testing.    MEROPENEM Value in next row Sensitive      <=4 SENSITIVEThis is a modified FDA-approved test that has been validated and its performance characteristics determined by the reporting laboratory.  This laboratory is certified under the Clinical Laboratory Improvement Amendments CLIA as qualified to perform high complexity clinical laboratory testing.    * >=100,000 COLONIES/mL ESCHERICHIA COLI  C Difficile Quick Screen w PCR reflex     Status: None   Collection Time: 04/30/24 10:05 PM   Specimen: STOOL  Result Value Ref Range Status   C Diff antigen NEGATIVE NEGATIVE Final   C Diff toxin NEGATIVE NEGATIVE Final   C Diff interpretation No C. difficile detected.  Final    Comment: Performed at [facility], [address] [place], [place] [address]  Blood culture (routine x 2)     Status: None   Collection Time: 05/01/24  4:00 AM   Specimen: BLOOD  Result Value Ref Range Status   Specimen Description   Final    BLOOD BLOOD RIGHT  ARM Performed at [facility], [address], [place], [place] [address]    Special Requests   Final    BOTTLES DRAWN AEROBIC ONLY Blood Culture results may not be optimal due to an inadequate volume of blood received in culture bottles Performed at [facility], [address] [place], [place] [address]    Culture   Final    NO GROWTH 5 DAYS Performed at [facility], [address] [place], [place] [address]    Report Status 05/06/2024 FINAL  Final  Blood culture (routine x 2)     Status: None   Collection Time: 05/01/24  4:04 AM   Specimen: BLOOD  Result Value Ref Range Status   Specimen Description   Final    BLOOD LEFT ANTECUBITAL Performed at [facility], [address] [place], [place] [address]    Special Requests   Final    BOTTLES DRAWN AEROBIC AND ANAEROBIC Blood Culture results may not be optimal due to an inadequate volume of blood received in culture bottles Performed at [facility], [address], [place], [place] [address]    Culture   Final    NO GROWTH 5 DAYS Performed at [facility], [address] [place], [place] [address]    Report Status 05/06/2024 FINAL  Final         Radiology Studies: US  THYROID  Result Date: 05/08/2024 CLINICAL DATA:  Incidental on CT. 837567 Multiple thyroid  nodules [id number] EXAM: THYROID  ULTRASOUND TECHNIQUE: Ultrasound examination of the thyroid  gland and adjacent soft tissues was performed. COMPARISON:  CT chest, 05/07/2024. FINDINGS: Parenchymal Echotexture: Normal Isthmus: 0.5 cm Right lobe: 4.7 x 3.0 x 2.7 cm Left lobe: 3.9 x 2.0 x 1.3 cm _________________________________________________________ Estimated total number of nodules >/= 1 cm: 1 Number of spongiform nodules >/=  2 cm not described below (TR1): 0 Number of mixed cystic and solid nodules >/= 1.5 cm not described below (TR2): 0  _________________________________________________________ Nodule # 1: Location: RIGHT; Mid Maximum size: 3.3 cm; Other 2 dimensions: 2.5 x 2.5 cm Composition: mixed cystic and solid (1) Echogenicity: anechoic (0) Shape: not taller-than-wide (0) Margins: ill-defined (0) Echogenic foci: none (0) ACR TI-RADS total points: 1. ACR TI-RADS risk category: TR1 (0-1 points). ACR TI-RADS recommendations: This nodule does NOT meet TI-RADS criteria for biopsy or dedicated follow-up. _________________________________________________________ No cervical adenopathy or abnormal fluid collection within the imaged neck. IMPRESSION: Solitary, 3.3 cm RIGHT mid TR-1 thyroid  nodule This nodule does NOT meet TI-RADS criteria for biopsy or dedicated follow-up. The above is in keeping with the ACR TI-RADS recommendations - J Am Coll Radiol 2017;14:587-595. Electronically Signed   By: Thom Hall M.D.   On: 05/08/2024 07:06   CT CHEST WO CONTRAST Result Date: 05/07/2024 CLINICAL DATA:  Intracranial metastases. EXAM: CT CHEST WITHOUT CONTRAST TECHNIQUE: Multidetector CT imaging of the chest was performed following the standard protocol without IV contrast. RADIATION DOSE REDUCTION: This exam was performed according to the departmental dose-optimization program which includes automated exposure control, adjustment of the mA and/or kV according to patient size and/or use of iterative reconstruction technique. COMPARISON:  Yesterday FINDINGS: Cardiovascular: Aortic atherosclerosis. Mild cardiomegaly. No pericardial effusion. Coronary artery calcifications are noted. Mediastinum/Nodes: 3 cm right thyroid  nodule is noted. Esophagus and trachea are unremarkable. No definite adenopathy is noted, although evaluation is limited due to lack of intravenous contrast. Lungs/Pleura: No pneumothorax is noted. Small bilateral pleural effusions are noted with adjacent subsegmental atelectasis. 18 x 14 mm irregular nodular density is noted laterally in  right middle lobe best seen in image number 106 of series 4, concerning for primary pulmonary malignancy. PET scan is recommended for further evaluation. Upper Abdomen: No acute abnormality. Musculoskeletal: No chest wall mass or suspicious bone lesions identified. IMPRESSION: 1. 18 x 14 mm irregular nodular density is noted laterally in right middle lobe concerning for primary pulmonary malignancy. PET scan is recommended for further  evaluation. 2. Small bilateral pleural effusions are noted with adjacent subsegmental atelectasis. 3. 3 cm right thyroid  nodule is noted. Recommend thyroid  US . (Ref: J Am Coll Radiol. 2015 Feb;12(2): 143-50). 4. Coronary artery calcifications are noted. 5. Aortic atherosclerosis. Aortic Atherosclerosis (ICD10-I70.0). Electronically Signed   By: Lynwood Landy Raddle M.D.   On: 05/07/2024 11:06     Scheduled Meds:  Chlorhexidine  Gluconate Cloth  6 each Topical Daily   desmopressin   0.1 mg Oral QHS   dexamethasone   4 mg Oral Q8H   feeding supplement  237 mL Oral BID BM   heparin   5,000 Units Subcutaneous Q8H   insulin  aspart  0-5 Units Subcutaneous QHS   insulin  aspart  0-6 Units Subcutaneous TID WC   insulin  glargine  5 Units Subcutaneous Daily   levETIRAcetam   750 mg Oral BID   pantoprazole   40 mg Oral Daily   polyethylene glycol  17 g Oral BID   simvastatin   40 mg Oral q1800   Continuous Infusions:  sodium chloride  50 mL/hr at 05/07/24 1810     LOS: 8 days    Sonya KANDICE Hoots, MD 05/08/2024, 12:27 PM   "

## 2024-05-09 ENCOUNTER — Encounter (HOSPITAL_COMMUNITY): Payer: Self-pay | Admitting: Internal Medicine

## 2024-05-09 ENCOUNTER — Inpatient Hospital Stay (HOSPITAL_COMMUNITY)

## 2024-05-09 ENCOUNTER — Inpatient Hospital Stay (HOSPITAL_COMMUNITY): Admitting: Certified Registered"

## 2024-05-09 ENCOUNTER — Encounter (HOSPITAL_COMMUNITY)
Admission: EM | Disposition: A | Discharge status: Skilled Nursing Facility | Payer: Self-pay | Source: Home / Self Care | Attending: Internal Medicine

## 2024-05-09 DIAGNOSIS — R911 Solitary pulmonary nodule: Secondary | ICD-10-CM | POA: Diagnosis not present

## 2024-05-09 DIAGNOSIS — N1831 Chronic kidney disease, stage 3a: Secondary | ICD-10-CM | POA: Diagnosis not present

## 2024-05-09 DIAGNOSIS — E1122 Type 2 diabetes mellitus with diabetic chronic kidney disease: Secondary | ICD-10-CM | POA: Diagnosis not present

## 2024-05-09 DIAGNOSIS — I129 Hypertensive chronic kidney disease with stage 1 through stage 4 chronic kidney disease, or unspecified chronic kidney disease: Secondary | ICD-10-CM | POA: Diagnosis not present

## 2024-05-09 HISTORY — PX: VIDEO BRONCHOSCOPY WITH ENDOBRONCHIAL NAVIGATION: SHX6175

## 2024-05-09 LAB — GLUCOSE, CAPILLARY
Glucose-Capillary: 204 mg/dL — ABNORMAL HIGH (ref 70–99)
Glucose-Capillary: 199 mg/dL — ABNORMAL HIGH (ref 70–99)
Glucose-Capillary: 176 mg/dL — ABNORMAL HIGH (ref 70–99)
Glucose-Capillary: 164 mg/dL — ABNORMAL HIGH (ref 70–99)
Glucose-Capillary: 220 mg/dL — ABNORMAL HIGH (ref 70–99)
Glucose-Capillary: 217 mg/dL — ABNORMAL HIGH (ref 70–99)

## 2024-05-09 MED ORDER — PROPOFOL 500 MG/50ML IV EMUL
INTRAVENOUS | Status: DC | PRN
  Administered 2024-05-09: 150 ug/kg/min via INTRAVENOUS

## 2024-05-09 MED ORDER — PROPOFOL 10 MG/ML IV BOLUS: INTRAVENOUS | Status: DC | PRN

## 2024-05-09 MED ORDER — LACTATED RINGERS IV SOLN: INTRAVENOUS | Status: DC

## 2024-05-09 MED ORDER — HYDROMORPHONE HCL 1 MG/ML IJ SOLN
INTRAMUSCULAR | Status: AC
  Filled 2024-05-09: qty 0.5

## 2024-05-09 MED ORDER — PHENYLEPHRINE HCL-NACL 20-0.9 MG/250ML-% IV SOLN
INTRAVENOUS | Status: DC | PRN
  Administered 2024-05-09: 25 ug/min via INTRAVENOUS

## 2024-05-09 MED ORDER — DEXAMETHASONE SOD PHOSPHATE PF 10 MG/ML IJ SOLN
INTRAMUSCULAR | Status: DC | PRN
  Administered 2024-05-09: 5 mg via INTRAVENOUS

## 2024-05-09 MED ORDER — HYDROMORPHONE HCL 1 MG/ML IJ SOLN
0.5000 mg | INTRAMUSCULAR | Status: DC | PRN
  Administered 2024-05-09 – 2024-05-26 (×27): 0.5 mg via INTRAVENOUS
  Filled 2024-05-09 (×28): qty 0.5

## 2024-05-09 MED ORDER — INSULIN ASPART 100 UNIT/ML IJ SOLN
INTRAMUSCULAR | Status: AC
  Filled 2024-05-09: qty 2

## 2024-05-09 MED ORDER — LIDOCAINE 2% (20 MG/ML) 5 ML SYRINGE
INTRAMUSCULAR | Status: DC | PRN
  Administered 2024-05-09: 60 mg via INTRAVENOUS

## 2024-05-09 MED ORDER — HYDROMORPHONE HCL 1 MG/ML IJ SOLN
0.5000 mg | INTRAMUSCULAR | Status: DC | PRN
  Administered 2024-05-09: 0.5 mg via INTRAVENOUS
  Filled 2024-05-09: qty 0.5

## 2024-05-09 MED ORDER — HYDROMORPHONE HCL 1 MG/ML IJ SOLN
0.5000 mg | INTRAMUSCULAR | Status: AC
  Administered 2024-05-09: 0.5 mg via INTRAVENOUS

## 2024-05-09 MED ORDER — HYDROMORPHONE HCL 1 MG/ML IJ SOLN: 1.0000 mg | INTRAMUSCULAR | Status: DC

## 2024-05-09 MED ORDER — INSULIN ASPART 100 UNIT/ML IJ SOLN
0.0000 [IU] | INTRAMUSCULAR | Status: DC | PRN
  Administered 2024-05-09: 2 [IU] via SUBCUTANEOUS

## 2024-05-09 MED ORDER — SUGAMMADEX SODIUM 200 MG/2ML IV SOLN
INTRAVENOUS | Status: DC | PRN
  Administered 2024-05-09: 100 mg via INTRAVENOUS
  Administered 2024-05-09: 200 mg via INTRAVENOUS

## 2024-05-09 MED ORDER — CHLORHEXIDINE GLUCONATE 0.12 % MT SOLN
OROMUCOSAL | Status: AC
  Filled 2024-05-09: qty 15

## 2024-05-09 MED ORDER — ROCURONIUM BROMIDE 10 MG/ML (PF) SYRINGE
PREFILLED_SYRINGE | INTRAVENOUS | Status: DC | PRN
  Administered 2024-05-09: 60 mg via INTRAVENOUS

## 2024-05-09 MED ORDER — FENTANYL CITRATE (PF) 100 MCG/2ML IJ SOLN: 25.0000 ug | INTRAMUSCULAR | Status: DC | PRN

## 2024-05-09 MED ORDER — ALBUMIN HUMAN 5 % IV SOLN: INTRAVENOUS | Status: DC | PRN

## 2024-05-09 MED ORDER — PHENYLEPHRINE 80 MCG/ML (10ML) SYRINGE FOR IV PUSH (FOR BLOOD PRESSURE SUPPORT)
PREFILLED_SYRINGE | INTRAVENOUS | Status: DC | PRN
  Administered 2024-05-09 (×2): 80 ug via INTRAVENOUS

## 2024-05-09 MED ORDER — ONDANSETRON HCL 4 MG/2ML IJ SOLN: 4.0000 mg | Freq: Once | INTRAMUSCULAR | Status: DC | PRN

## 2024-05-09 MED ORDER — PROPOFOL 10 MG/ML IV BOLUS
INTRAVENOUS | Status: DC | PRN
  Administered 2024-05-09: 120 mg via INTRAVENOUS

## 2024-05-09 MED ORDER — ONDANSETRON HCL 4 MG/2ML IJ SOLN
INTRAMUSCULAR | Status: DC | PRN
  Administered 2024-05-09: 4 mg via INTRAVENOUS

## 2024-05-09 NOTE — Progress Notes (Signed)
" ° °  Inpatient Rehabilitation Admissions Coordinator   Therapy has signed off therefore I will also.  Heron Leavell, RN, MSN Rehab Admissions Coordinator [phone] 05/09/2024 10:03 AM  "

## 2024-05-09 NOTE — Transfer of Care (Signed)
 Immediate Anesthesia Transfer of Care Note  Patient: Sonya Solomon  Procedure(s) Performed: VIDEO BRONCHOSCOPY WITH ENDOBRONCHIAL NAVIGATION (Right)  Patient Location: PACU  Anesthesia Type:General  Level of Consciousness: awake, alert , and oriented  Airway & Oxygen Therapy: Patient Spontanous Breathing and Patient connected to face mask oxygen  Post-op Assessment: Report given to RN and Post -op Vital signs reviewed and stable  Post vital signs: Reviewed and stable  Last Vitals:  Vitals Value Taken Time  BP 139/80 05/09/24 12:56  Temp    Pulse 65 05/09/24 12:59  Resp 14 05/09/24 12:59  SpO2 98 % 05/09/24 12:59  Vitals shown include unfiled device data.  Last Pain:  Vitals:   05/09/24 0953  TempSrc: Rectal  PainSc:       Patients Stated Pain Goal: 0 (05/07/24 2044)  Complications: No notable events documented.

## 2024-05-09 NOTE — Op Note (Signed)
 Video Bronchoscopy with Robotic Assisted Bronchoscopic Navigation   Date of Operation: 05/09/2024   Pre-op Diagnosis: Right middle lobe nodule, brain metastases  Post-op Diagnosis: Same  Surgeon: Lamar Chris  Assistants: None  Anesthesia: General endotracheal anesthesia  Operation: Flexible video fiberoptic bronchoscopy with robotic assistance and biopsies.  Estimated Blood Loss: Minimal  Complications: None  Indications and History: Sonya Solomon is a 79 y.o. female with history of multiple sclerosis, diabetes, hyperlipidemia, hypertension and seizures.  She was found to have metastatic lesions on brain scan.  Surveillance imaging then revealed a new right middle lobe pulmonary nodule suspicious for possible primary malignancy.  Recommendation made to achieve a tissue diagnosis via robotic assisted navigational bronchoscopy.  The risks, benefits, complications, treatment options and expected outcomes were discussed with the patient.  The possibilities of pneumothorax, pneumonia, reaction to medication, pulmonary aspiration, perforation of a viscus, bleeding, failure to diagnose a condition and creating a complication requiring transfusion or operation were discussed with the patient who freely signed the consent.    Description of Procedure: The patient was seen in the Preoperative Area, was examined and was deemed appropriate to proceed.  The patient was taken to [facility] Endoscopy room 3, identified as Sonya Solomon and the procedure verified as Flexible Video Fiberoptic Bronchoscopy.  A Time Out was held and the above information confirmed.   Prior to the date of the procedure a high-resolution CT scan of the chest was performed. Utilizing ION software program a virtual tracheobronchial tree was generated to allow the creation of distinct navigation pathways to the patient's parenchymal abnormalities. After being taken to the operating room general anesthesia was initiated and the patient   was orally intubated. The video fiberoptic bronchoscope was introduced via the endotracheal tube and a general inspection was performed which showed normal right and left lung anatomy. Aspiration of the bilateral mainstems was completed to remove any remaining secretions. Robotic catheter inserted into patient's endotracheal tube.   Target #1 right middle lobe nodule: The distinct navigation pathways prepared prior to this procedure were then utilized to navigate to patient's lesion identified on CT scan. The robotic catheter was secured into place and the vision probe was withdrawn.  Lesion location was approximated using fluoroscopy.  Local registration and targeting was performed using [employer] Cios mobile C-arm three-dimensional imaging. Under fluoroscopic guidance transbronchial needle biopsies and transbronchial cryoprobe biopsies were performed to be sent for cytology and pathology.  Needle-in-lesion was confirmed using Cios mobile C-arm.    At the end of the procedure a general airway inspection was performed and there was no evidence of active bleeding. The bronchoscope was removed.  The patient tolerated the procedure well. There was no significant blood loss and there were no obvious complications. A post-procedural chest x-ray is pending.  Samples Target #1: 1. Transbronchial Wang needle biopsies from right middle lobe nodule 2. Transbronchial cryoprobe biopsies from right middle lobe nodule    Plans:  The patient will be transferred from the PACU back to her hospital bed at [facility] when recovered from anesthesia and after chest x-ray is reviewed. We will review the cytology, pathology results with the patient when they become available.  Okay to restart her prophylactic heparin  on 1/7.  Lamar Chris, MD, PhD 05/09/2024, 12:25 PM [facility] [phone] or if no answer before 7:00PM call [phone] For any issues after 7:00PM please call eLink  [phone]

## 2024-05-09 NOTE — Anesthesia Procedure Notes (Signed)
 Procedure Name: Intubation Date/Time: 05/09/2024 11:11 AM  Performed by: Delores Dus, CRNAPre-anesthesia Checklist: Patient identified, Emergency Drugs available, Suction available and Patient being monitored Patient Re-evaluated:Patient Re-evaluated prior to induction Oxygen Delivery Method: Circle system utilized Preoxygenation: Pre-oxygenation with 100% oxygen Induction Type: IV induction Ventilation: Mask ventilation without difficulty Laryngoscope Size: Miller and 2 Grade View: Grade I Tube type: Oral Tube size: 8.5 mm Number of attempts: 1 Airway Equipment and Method: Stylet and Oral airway Placement Confirmation: ETT inserted through vocal cords under direct vision, positive ETCO2 and breath sounds checked- equal and bilateral Secured at: 21 cm Tube secured with: Tape Dental Injury: Teeth and Oropharynx as per pre-operative assessment

## 2024-05-09 NOTE — Progress Notes (Signed)
 Pt seen in PACU bay 9 to assess need for cpap post op. Upon arrival pt on simple mask at 6L, SpO2 98%. Pt in no distress and talking to staff. Dr. Shelah at bedside. CPAP currently not needed. RT will continue to be available as needed.

## 2024-05-09 NOTE — Inpatient Diabetes Management (Signed)
 Inpatient Diabetes Program Recommendations  AACE/ADA: New Consensus Statement on Inpatient Glycemic Control (2015)  Target Ranges:  Prepandial:   less than 140 mg/dL      Peak postprandial:   less than 180 mg/dL (1-2 hours)      Critically ill patients:  140 - 180 mg/dL    Latest Reference Range & Units 05/08/24 08:07 05/08/24 12:55 05/08/24 16:44 05/08/24 21:05  Glucose-Capillary 70 - 99 mg/dL 827 (H)  1 unit Novolog   Lantus  5 units  203 (H)  2 units Novolog   231 (H)  2 units Novolog   264 (H)  3 units Novolog    (H): Data is abnormally high  Latest Reference Range & Units 05/09/24 08:01  Glucose-Capillary 70 - 99 mg/dL 795 (H)  (H): Data is abnormally high     Home DM Meds: Metformin  1000 mg BID       Actos  45 mg daily       Ozempic  1 mg Qweek  Current Orders: Lantus  5 units daily     Novolog  0-6 units TID AC + HS    MD- Note pt getting Decadron  4 mg Q8H  Please consider:  1. Increase Lantus  to 8 units daily  2. Increase Novolog  SSI to the 0-9 unit scale     --Will follow patient during hospitalization--  Adina Rudolpho Arrow RN, MSN, CDCES Diabetes Coordinator Inpatient Glycemic Control Team Team Pager: [phone] (8a-5p)

## 2024-05-09 NOTE — Anesthesia Postprocedure Evaluation (Signed)
"   Anesthesia Post Note  Patient: Sonya Solomon  Procedure(s) Performed: VIDEO BRONCHOSCOPY WITH ENDOBRONCHIAL NAVIGATION (Right)     Patient location during evaluation: PACU Anesthesia Type: General Level of consciousness: awake and alert and oriented Pain management: pain level controlled Vital Signs Assessment: post-procedure vital signs reviewed and stable Respiratory status: spontaneous breathing, nonlabored ventilation, respiratory function stable and patient connected to nasal cannula oxygen Cardiovascular status: blood pressure returned to baseline and stable Postop Assessment: no apparent nausea or vomiting Anesthetic complications: no   No notable events documented.  Last Vitals:  Vitals:   05/09/24 1315 05/09/24 1330  BP: 132/69 (!) 148/74  Pulse: 60 (!) 58  Resp: 11 12  Temp:  36.6 C  SpO2: 93% 96%    Last Pain:  Vitals:   05/09/24 1256  TempSrc:   PainSc: 0-No pain                 Caitlin Ainley A.      "

## 2024-05-09 NOTE — Plan of Care (Signed)
   Problem: Education: Goal: Ability to describe self-care measures that may prevent or decrease complications (Diabetes Survival Skills Education) will improve Outcome: Progressing Goal: Individualized Educational Video(s) Outcome: Progressing

## 2024-05-09 NOTE — Interval H&P Note (Signed)
 History and Physical Interval Note:  05/09/2024 10:40 AM  Sonya Solomon  has presented today for surgery, with the diagnosis of right middle lobe nodule.  The various methods of treatment have been discussed with the patient and family. After consideration of risks, benefits and other options for treatment, the patient has consented to  Procedures: VIDEO BRONCHOSCOPY WITH ENDOBRONCHIAL NAVIGATION (Right) as a surgical intervention.  The patient's history has been reviewed, patient examined, no change in status, stable for surgery.  I have reviewed the patient's chart and labs.  Questions were answered to the patient's satisfaction.     Lamar GORMAN Chris

## 2024-05-09 NOTE — Progress Notes (Signed)
 " PROGRESS NOTE    Sonya Solomon  FMW:[id number] DOB: 03-12-1946 DOA: 04/30/2024 PCP: Leonel Cole, MD  Brief Narrative: 79 y.o. female with medical history significant for MS, type 2 diabetes, hyperlipidemia, hypertension, obesity, history of gait disturbance, vertigo, urinary urgency, spasticity, insomnia, admitted after a fall she was found to be very dehydrated due to diarrhea and an AKI on CKD stage III A and was admitted for the same. At baseline she walks with a walker and a cane independent with all ADLs but does not cook or clean the house.  She lives at home with her husband. See rest of the details below.  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  Patient has a history of MS and is followed by outpatient neurology.  She is being treated with Decadron  and Keppra .  She was on Keppra  prior to admission to hospital.   MRI brain 05/06/2024 shows 45 enhancing nodules and ring-enhancing lesions within the cerebral and cerebellar hemispheres bilaterally highly suspicious for metastatic disease with the largest lesion in the left parietal lobe 3.2 x 2 x 3.6 cm and extensive cerebral white matter disease. CT chest 05/07/2024 18 x 14 mm irregular nodular density is noted laterally in right middle lobe concerning for primary pulmonary malignancy. PET scan is recommended for further evaluation. Small bilateral pleural effusions are noted with adjacent subsegmental atelectasis.3 cm right  thyroid  nodule is noted. Recommend thyroid  US . Coronary artery calcifications are noted.Aortic atherosclerosis. CT abdomen and pelvis 04/30/2024 Pancolitis, greatest proximally.Sigmoid and descending colon diverticulosis without diverticulitis. Indeterminate 1.9 cm left adrenal nodule; recommend 1-year follow-up adrenal washout CT.Partially imaged intraatrial lipoma  Ultrasound of the thyroid  solitary 3.3 cm right mid thyroid  nodule does not meet criteria for biopsy. She is status post transbronchial biopsies of the right middle lobe Dr. Shelah 05/09/2024.  Await  pathology.  #2 AKI on CKD stage IIIa baseline creatinine is around 1.1.   Her creatinine came down to 1.16 with hydration short course of Solu-Cortef  and midodrine . She was found to have a creatinine of 5.4 on admission due to dehydration poor p.o. intake and diarrhea and hypotension.  She was treated with IV fluids.Renal ultrasound shows some evidence of medical renal disease with no obstruction.    #3 hypotension resolved with fluids midodrine  and Solu-Cortef    #4 E. coli UTI, POA completed treatment with Rocephin .    #5 hyperkalemia resolved  #6 hypomagnesemia resolved after repletion   #7 demand ischemia with NSVT SVT- - Troponin trend relatively flat 126, 141, 169 Cardiology was consulted.  There was a concern for apical hypertrophy question HOCM? Cardiology saw the patient and recommended outpatient follow-up.  They were not in favor of giving Coreg  or beta-blockers at this time. - Echocardiogram negative for regional wall motion abnormality, EF 70 to 75%, grade 1 diastolic dysfunction, elevated pulmonary artery systolic pressure, LV hypertrophy severe?       #8 constipation MiraLAX  and senna and Dulcolax ordered  she was admitted with diarrhea initially.  #9 seizure disorder Continue Keppra  dose increased since renal functions improving   #10 MS followed by Dr. Vear neurology as an outpatient.  Seen by PT OT recommending  SNF.  CIR saw the patient did not think she is a candidate for CIR.   #11 hypertension continue hydralazine  as needed  lisinopril  on hold due to AKI will likely resume tomorrow  #12 diabetes mellitus with hyperglycemia on Decadron  -on metformin  Actos  and Ozempic  prior to admission.  Her A1c is 7.0.  She was started on Lantus  the dosing is being adjusted for her blood sugar.  Her blood sugar started to rise up even further after starting Decadron .  Hyperlipidemia continue Zocor   Estimated body mass index is 33.38 kg/m as calculated from the following:   Height as of this encounter: 5' 4.5 (1.638 m).   Weight as of this encounter: 89.6 kg.  DVT prophylaxis: Heparin   code Status: Full code  family Communication: with patient's husband on the phone  disposition Plan:  Status is: Inpatient Remains inpatient appropriate because: Metastatic workup   Consultants:  None  Procedures: None Antimicrobials:-Finished a course of Rocephin   Subjective: Patient seen prior to biopsy anxious nervous complaining of back pain given Dilaudid  1 mg prior to transfer  Objective:foley in place  Vitals:   05/08/24 1259 05/08/24 2055 05/09/24 0449 05/09/24 0953  BP: (!) 142/72 118/64 (!) 153/73 (!) 170/94  Pulse: 69 62 (!) 52 61  Resp: 20 18 17 16   Temp: 97.6 F (36.4 C) 98.8 F (37.1 C) 97.7 F (36.5 C) 98 F (36.7 C)  TempSrc:  Oral Oral Rectal  SpO2: 96% 96% 97%   Weight:      Height:        Intake/Output Summary (Last 24 hours) at 05/09/2024 1212 Last data filed at 05/09/2024 1207 Gross per 24 hour  Intake 250 ml  Output 1200 ml  Net -950 ml   Filed Weights   04/30/24 2001 04/30/24 2015  Weight: 89.6 kg 89.6 kg    Examination:  General exam: Appears  anxious tearful Respiratory system: Clear to auscultation. Respiratory effort normal.  Diminished breath sounds Cardiovascular system:reg  Gastrointestinal system: Abdomen is nondistended, soft and nontender. No organomegaly or masses  felt. Normal bowel sounds heard. Central nervous system: Alert and oriented. Extremities: 1+ bilateral feet edema peers to be chronic    Data Reviewed: I have personally reviewed following labs and imaging studies  CBC: Recent Labs  Lab 05/04/24 0626 05/05/24 0627 05/06/24 1011 05/07/24 1820 05/08/24 0527  WBC 7.0 8.8 9.6 10.5 9.6  HGB 9.2* 9.9* 10.9* 11.2* 10.7*  HCT 27.0* 29.5* 32.3* 32.8* 31.7*  MCV 90.3 91.0 91.2 89.4 90.6  PLT 203 225 239 263 232   Basic Metabolic Panel: Recent Labs  Lab 05/04/24 0626 05/05/24 0627 05/06/24 1011 05/07/24 1820 05/08/24 0527  NA 138 140 139 139 139  K 4.5 4.2 4.6 4.4 4.5  CL 112* 114* 110 109 110  CO2 16* 16* 17* 19* 20*  GLUCOSE 240* 184* 265* 209* 227*  BUN 69* 67* 53* 42* 38*  CREATININE 2.87* 2.15* 1.56* 1.32* 1.16*  CALCIUM 8.6* 8.8* 9.0 9.0 8.8*  MG  --  1.4* 2.0  --   --    GFR: Estimated Creatinine Clearance: 45.2 mL/min (A) (by C-G formula based on SCr of 1.16 mg/dL (H)). Liver Function Tests: Recent Labs  Lab 05/05/24 0627 05/06/24 1011 05/07/24 1820 05/08/24 0527  AST 23 46*  38 27  ALT 25 70* 67* 62*  ALKPHOS 32* 42 40 37*  BILITOT <0.2 0.2 0.2 0.2  PROT 5.3* 6.0* 5.7* 5.4*  ALBUMIN  3.3* 3.5 3.4* 3.2*   No results for input(s): LIPASE, AMYLASE in the last 168 hours. No results for input(s): AMMONIA in the last 168 hours. Coagulation Profile: Recent Labs  Lab 05/07/24 1820  INR 1.0   Cardiac Enzymes: No results for input(s): CKTOTAL, CKMB, CKMBINDEX, TROPONINI in the last 168 hours.  BNP (last 3 results) No results for input(s): PROBNP in the last 8760 hours. HbA1C: No results for input(s): HGBA1C in the last 72 hours. CBG: Recent Labs  Lab 05/08/24 1255 05/08/24 1644 05/08/24 2105 05/09/24 0801 05/09/24 0955  GLUCAP 203* 231* 264* 204* 199*   Lipid Profile: No results for input(s): CHOL, HDL, LDLCALC, TRIG, CHOLHDL, LDLDIRECT in the last 72 hours. Thyroid   Function Tests: No results for input(s): TSH, T4TOTAL, FREET4, T3FREE, THYROIDAB in the last 72 hours. Anemia Panel: No results for input(s): VITAMINB12, FOLATE, FERRITIN, TIBC, IRON, RETICCTPCT in the last 72 hours. Sepsis Labs: No results for input(s): PROCALCITON, LATICACIDVEN in the last 168 hours.  Recent Results (from the past 240 hours)  Urine Culture     Status: Abnormal   Collection Time: 04/30/24  8:24 PM   Specimen: Urine, Random  Result Value Ref Range Status   Specimen Description   Final    URINE, RANDOM Performed at [facility], [address], [place], [place] [address]    Special Requests   Final    NONE Reflexed from [id number] Performed at [facility], [address], [place], [place] [address]    Culture >=100,000 COLONIES/mL ESCHERICHIA COLI (A)  Final   Report Status 05/03/2024 FINAL  Final   Organism ID, Bacteria ESCHERICHIA COLI (A)  Final      Susceptibility   Escherichia coli - MIC*    AMPICILLIN >=32 RESISTANT Resistant     CEFAZOLIN (URINE) Value in next row Sensitive      8 SENSITIVEThis is a modified FDA-approved test that has been validated and its performance characteristics determined by the reporting laboratory.  This laboratory is certified under the Clinical Laboratory Improvement Amendments CLIA as qualified to perform high complexity clinical laboratory testing.    CEFEPIME Value in next row Sensitive      8 SENSITIVEThis is a modified FDA-approved test that has been validated and its performance characteristics determined by the reporting laboratory.  This laboratory is certified under the Clinical Laboratory Improvement Amendments CLIA as qualified to perform high complexity clinical laboratory testing.    ERTAPENEM Value in next row Sensitive      8 SENSITIVEThis is a modified FDA-approved test that has been validated and its performance characteristics determined by the reporting  laboratory.  This laboratory is certified under the Clinical Laboratory Improvement Amendments CLIA as qualified to perform high complexity clinical laboratory testing.    CEFTRIAXONE  Value in next row Sensitive      8 SENSITIVEThis is a modified FDA-approved test that has been validated and its performance characteristics determined by the reporting laboratory.  This laboratory is certified under the Clinical Laboratory Improvement Amendments CLIA as qualified to perform high complexity clinical laboratory testing.    CIPROFLOXACIN Value in next row Sensitive      8 SENSITIVEThis is a modified FDA-approved test that has been validated and its performance characteristics determined by the reporting laboratory.  This laboratory is certified under the Clinical  Laboratory Improvement Amendments CLIA as qualified to perform high complexity clinical laboratory testing.    GENTAMICIN Value in next row Sensitive      8 SENSITIVEThis is a modified FDA-approved test that has been validated and its performance characteristics determined by the reporting laboratory.  This laboratory is certified under the Clinical Laboratory Improvement Amendments CLIA as qualified to perform high complexity clinical laboratory testing.    NITROFURANTOIN Value in next row Sensitive      8 SENSITIVEThis is a modified FDA-approved test that has been validated and its performance characteristics determined by the reporting laboratory.  This laboratory is certified under the Clinical Laboratory Improvement Amendments CLIA as qualified to perform high complexity clinical laboratory testing.    TRIMETH/SULFA Value in next row Sensitive      8 SENSITIVEThis is a modified FDA-approved test that has been validated and its performance characteristics determined by the reporting laboratory.  This laboratory is certified under the Clinical Laboratory Improvement Amendments CLIA as qualified to perform high complexity clinical laboratory testing.     AMPICILLIN/SULBACTAM Value in next row Resistant      8 SENSITIVEThis is a modified FDA-approved test that has been validated and its performance characteristics determined by the reporting laboratory.  This laboratory is certified under the Clinical Laboratory Improvement Amendments CLIA as qualified to perform high complexity clinical laboratory testing.    PIP/TAZO Value in next row Sensitive      <=4 SENSITIVEThis is a modified FDA-approved test that has been validated and its performance characteristics determined by the reporting laboratory.  This laboratory is certified under the Clinical Laboratory Improvement Amendments CLIA as qualified to perform high complexity clinical laboratory testing.    MEROPENEM Value in next row Sensitive      <=4 SENSITIVEThis is a modified FDA-approved test that has been validated and its performance characteristics determined by the reporting laboratory.  This laboratory is certified under the Clinical Laboratory Improvement Amendments CLIA as qualified to perform high complexity clinical laboratory testing.    * >=100,000 COLONIES/mL ESCHERICHIA COLI  C Difficile Quick Screen w PCR reflex     Status: None   Collection Time: 04/30/24 10:05 PM   Specimen: STOOL  Result Value Ref Range Status   C Diff antigen NEGATIVE NEGATIVE Final   C Diff toxin NEGATIVE NEGATIVE Final   C Diff interpretation No C. difficile detected.  Final    Comment: Performed at [facility], [address], [place], [place] [address]  Blood culture (routine x 2)     Status: None   Collection Time: 05/01/24  4:00 AM   Specimen: BLOOD  Result Value Ref Range Status   Specimen Description   Final    BLOOD BLOOD RIGHT ARM Performed at [facility], [address] [place], [place] [address]    Special Requests   Final    BOTTLES DRAWN AEROBIC ONLY Blood Culture results may not be optimal due to an inadequate volume of blood received in culture  bottles Performed at [facility], [address] [place], [place] [address]    Culture   Final    NO GROWTH 5 DAYS Performed at [facility], [address], [place], [place] [address]    Report Status 05/06/2024 FINAL  Final  Blood culture (routine x 2)     Status: None   Collection Time: 05/01/24  4:04 AM   Specimen: BLOOD  Result Value Ref Range Status   Specimen  Description   Final    BLOOD LEFT ANTECUBITAL Performed at [facility], [address], [place], [place] [address]    Special Requests   Final    BOTTLES DRAWN AEROBIC AND ANAEROBIC Blood Culture results may not be optimal due to an inadequate volume of blood received in culture bottles Performed at [facility], [address], [place], [place] [address]    Culture   Final    NO GROWTH 5 DAYS Performed at [facility], [address], [place], [place] [address]    Report Status 05/06/2024 FINAL  Final         Radiology Studies: US  THYROID  Result Date: 05/08/2024 CLINICAL DATA:  Incidental on CT. 837567 Multiple thyroid  nodules [id number] EXAM: THYROID  ULTRASOUND TECHNIQUE: Ultrasound examination of the thyroid  gland and adjacent soft tissues was performed. COMPARISON:  CT chest, 05/07/2024. FINDINGS: Parenchymal Echotexture: Normal Isthmus: 0.5 cm Right lobe: 4.7 x 3.0 x 2.7 cm Left lobe: 3.9 x 2.0 x 1.3 cm _________________________________________________________ Estimated total number of nodules >/= 1 cm: 1 Number of spongiform nodules >/=  2 cm not described below (TR1): 0 Number of mixed cystic and solid nodules >/= 1.5 cm not described below (TR2): 0 _________________________________________________________ Nodule # 1: Location: RIGHT; Mid Maximum size: 3.3 cm; Other 2 dimensions: 2.5 x 2.5 cm Composition: mixed cystic and solid (1) Echogenicity: anechoic (0) Shape: not taller-than-wide (0) Margins: ill-defined (0) Echogenic foci: none (0) ACR  TI-RADS total points: 1. ACR TI-RADS risk category: TR1 (0-1 points). ACR TI-RADS recommendations: This nodule does NOT meet TI-RADS criteria for biopsy or dedicated follow-up. _________________________________________________________ No cervical adenopathy or abnormal fluid collection within the imaged neck. IMPRESSION: Solitary, 3.3 cm RIGHT mid TR-1 thyroid  nodule This nodule does NOT meet TI-RADS criteria for biopsy or dedicated follow-up. The above is in keeping with the ACR TI-RADS recommendations - J Am Coll Radiol 2017;14:587-595. Electronically Signed   By: Thom Hall M.D.   On: 05/08/2024 07:06     Scheduled Meds:  chlorhexidine        [MAR Hold] Chlorhexidine  Gluconate Cloth  6 each Topical Daily   [MAR Hold] desmopressin   0.1 mg Oral QHS   [MAR Hold] dexamethasone   4 mg Oral Q8H   [MAR Hold] feeding supplement  237 mL Oral BID BM   HYDROmorphone        insulin  aspart       [MAR Hold] insulin  aspart  0-5 Units Subcutaneous QHS   [MAR Hold] insulin  aspart  0-6 Units Subcutaneous TID WC   [MAR Hold] insulin  glargine  5 Units Subcutaneous Daily   [MAR Hold] levETIRAcetam   750 mg Oral BID   [MAR Hold] pantoprazole   40 mg Oral Daily   [MAR Hold] polyethylene glycol  17 g Oral BID   [MAR Hold] simvastatin   40 mg Oral q1800   Continuous Infusions:  lactated ringers  10 mL/hr at 05/09/24 1059     LOS: 9 days    Almarie KANDICE Hoots, MD 05/09/2024, 12:12 PM   "

## 2024-05-09 NOTE — Plan of Care (Signed)

## 2024-05-09 NOTE — Progress Notes (Signed)
 Pt is sleepy and mildly confused. I called her husband Janaria Mccammon, he is aware and consented for the planned Bronchoscopy.

## 2024-05-09 NOTE — Anesthesia Preprocedure Evaluation (Addendum)
 "                                  Anesthesia Evaluation  Patient identified by MRN, date of birth, ID band Patient awake and Patient confused    Reviewed: Allergy & Precautions, NPO status , Patient's Chart, lab work & pertinent test results  Airway Mallampati: II  TM Distance: >3 FB Neck ROM: Full    Dental  (+) Dental Advisory Given, Partial Upper, Partial Lower   Pulmonary  right middle lobe nodule   Pulmonary exam normal breath sounds clear to auscultation       Cardiovascular hypertension, Pt. on medications + Peripheral Vascular Disease  Normal cardiovascular exam Rhythm:Regular Rate:Normal     Neuro/Psych Seizures -,  Multiple sclerosis   negative psych ROS   GI/Hepatic Neg liver ROS,GERD  Medicated,,  Endo/Other  diabetes, Type 2, Oral Hypoglycemic Agents  Obesity   Renal/GU Renal InsufficiencyRenal diseaseLab Results      Component                Value               Date                      NA                       139                 05/08/2024                CL                       110                 05/08/2024                K                        4.5                 05/08/2024                CO2                      20 (L)              05/08/2024                BUN                      38 (H)              05/08/2024                CREATININE               1.16 (H)            05/08/2024                GFRNONAA                 49 (L)              05/08/2024                CALCIUM  8.8 (L)             05/08/2024                ALBUMIN                   3.2 (L)             05/08/2024                GLUCOSE                  227 (H)             05/08/2024                Musculoskeletal negative musculoskeletal ROS (+)    Abdominal   Peds  Hematology  (+) Blood dyscrasia, anemia Lab Results      Component                Value               Date                      WBC                      9.6                  05/08/2024                HGB                      10.7 (L)            05/08/2024                HCT                      31.7 (L)            05/08/2024                MCV                      90.6                05/08/2024                PLT                      232                 05/08/2024              Anesthesia Other Findings   Reproductive/Obstetrics                              Anesthesia Physical Anesthesia Plan  ASA: 3  Anesthesia Plan: General   Post-op Pain Management:    Induction: Intravenous  PONV Risk Score and Plan: 3 and Dexamethasone  and Ondansetron   Airway Management Planned: Oral ETT  Additional Equipment:   Intra-op Plan:   Post-operative Plan: Extubation in OR  Informed Consent: I have reviewed the patients History and Physical, chart, labs and discussed the procedure including the risks, benefits and alternatives for the proposed anesthesia with the patient or authorized representative who has indicated his/her understanding and acceptance.  Dental advisory given  Plan Discussed with: CRNA  Anesthesia Plan Comments:          Anesthesia Quick Evaluation  "

## 2024-05-10 DIAGNOSIS — R911 Solitary pulmonary nodule: Secondary | ICD-10-CM | POA: Diagnosis not present

## 2024-05-10 LAB — CYTOLOGY - NON PAP

## 2024-05-10 LAB — GLUCOSE, CAPILLARY
Glucose-Capillary: 214 mg/dL — ABNORMAL HIGH (ref 70–99)
Glucose-Capillary: 254 mg/dL — ABNORMAL HIGH (ref 70–99)
Glucose-Capillary: 208 mg/dL — ABNORMAL HIGH (ref 70–99)
Glucose-Capillary: 240 mg/dL — ABNORMAL HIGH (ref 70–99)

## 2024-05-10 MED ORDER — ORAL CARE MOUTH RINSE: 15.0000 mL | OROMUCOSAL | Status: DC | PRN

## 2024-05-10 MED ORDER — INSULIN GLARGINE-YFGN 100 UNIT/ML ~~LOC~~ SOLN
10.0000 [IU] | Freq: Every day | SUBCUTANEOUS | Status: DC
  Administered 2024-05-11 – 2024-05-12 (×2): 10 [IU] via SUBCUTANEOUS
  Filled 2024-05-10 (×2): qty 0.1

## 2024-05-10 NOTE — Progress Notes (Signed)
 " PROGRESS NOTE  Sonya Solomon  DOB: 1946-03-13  PCP: Leonel Cole, MD FMW:[id number]  DOA: 04/30/2024  LOS: 10 days  Hospital Day: 11  Subjective: Patient was seen and examined this morning. Pleasant elderly Caucasian female.  Lying on bed.  Feels very weak.  No family at bedside. I told her about her the lung biopsy report from yesterday showing adenocarcinoma. Afebrile, heart rate in 50s and 60s, blood pressure in 130s to 140s, breathing on 2 L oxygen Blood sugar level elevated over 200 consistently.  Brief narrative: Sonya Solomon is a 79 y.o. female with PMH significant for DM2, HTN, HLD, CKD 3A, obesity, MS, gait disturbance, vertigo, urinary urgency, spasticity, insomnia Lives at home with her husband. At baseline, walks with a walker and cane and independent with ADLs  12/28, patient presented to ED after a fall, diarrhea She was found to be very dehydrated with creatinine significantly elevated to 5.4 CT abdomen pelvis showed pancolitis Admitted to [facility] course was complicated by new diagnosis of brain mets from lung primary.  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  CT head was obtained which showed multiple intracranial lesions suggestive of metastatic disease with vasogenic edema, local mass effect Follow-up MRI brain was obtained which confirmed multiple enhancing nodules within the cerebral cerebellar hemisphere bilaterally suspicious for metastatic disease. 1/4 CT chest showed 18 x 14 mm irregular nodular density in the right middle lobe.  Seen by pulmonary 1/6, underwent flexible video fiberoptic bronchoscopy with robotic assistance and biopsies.  Biopsy report showed adenocarcinoma of lung. I requested oncology consultation this afternoon 1/7 Currently on Keppra  750 mg twice daily and Decadron  4 mg every 8 hours,  Acute pancolitis Initially presented with diarrhea  dehydration CT scan on admission showed pancolitis Patient reports improvement in diarrhea.   Continue to monitor severity and frequency of bowel movement If constipated, to use MiraLAX  and senna and Dulcolax   Hypotension H/o hypertension Hypotension resolved with fluids, midodrine  and Solu-Cortef  Lisinopril  on hold Currently also on DDAVP  0.1 mg nightly.  AKI on CKD 3A Hypomagnesemia Baseline creatinine 1.1.  Presented with creatinine elevated to 5.4 due to diarrhea dehydration With IV hydration, creatinine significant improved now.  Renal ultrasound without any obstruction. Magnesium  level improved with replacement. Recent Labs  Lab 05/04/24 0626 05/05/24 0627 05/06/24 1011 05/07/24 1820 05/08/24 0527  NA 138 140 139 139 139  K 4.5 4.2 4.6 4.4 4.5  CL 112* 114* 110 109 110  CO2 16* 16* 17* 19* 20*  GLUCOSE 240* 184* 265* 209* 227*  BUN 69* 67* 53* 42* 38*  CREATININE 2.87* 2.15* 1.56* 1.32* 1.16*  CALCIUM 8.6* 8.8* 9.0 9.0 8.8*  MG  --  1.4* 2.0  --   --    E. coli UTI, POA  completed treatment with Rocephin .   Acute urinary retention Foley catheter was inserted in the ED.  Patient feels very weak and wants to continue Foley for now.  I have discussed with her the risk of long-term Foley dependence.  Will offer voiding trial again once her mobility improves.      Demand ischemia with NSVT SVT- Troponin trend relatively flat 126, 141, 169 Echocardiogram negative for regional wall motion abnormality, EF 70 to 75%, grade 1 diastolic dysfunction, elevated pulmonary artery systolic pressure, LV hypertrophy severe?     Cardiology recommend outpatient follow-up  Seizure disorder Continue Keppra     Multiple sclerosis followed by Dr.  Sater neurology as an outpatient.   Seen by PT OT recommending SNF.   CIR saw the patient did not think she is a candidate for CIR. She seems to be much weaker currently than her baseline 3 weeks ago.  Needs to encourage ambulation.  Type  2 diabetes mellitus  hyperglycemia due to steroids A1c controlled at 7 on 04/30/2024 Blood sugar level running elevated due to steroids PTA meds-metformin , Actos , Ozempic  Currently on Lantus  5 units daily, SSI/Accu-Cheks Increase Lantus  to 10 units daily. Recent Labs  Lab 05/09/24 1309 05/09/24 1627 05/09/24 2054 05/10/24 0738 05/10/24 1152  GLUCAP 164* 220* 217* 214* 254*   Hyperlipidemia  continue Zocor   Anxiety/depression Xanax  0.25 mg 3 times daily as needed    Nutrition Status:         Mobility:   PT Orders:   PT Follow up Rec: Skilled Nursing-Short Term Rehab (<3 Hours/Day)05/05/2024 1211    Goals of care   Code Status: Full Code     DVT prophylaxis:  Place and maintain sequential compression device Start: 05/10/24 1520   Antimicrobials: None currently Fluid: None Consultants: Oncology, palliative care Family Communication: None at bedside  Status: Inpatient Level of care:  Med-Surg   Patient is from: Home Needs to continue in-hospital care: Pending oncology, palliative care consultation Anticipated d/c to: SNF recommended by PT   Diet:  Diet Order             Diet Carb Modified Room service appropriate? Yes  Diet effective now                   Scheduled Meds:  Chlorhexidine  Gluconate Cloth  6 each Topical Daily   desmopressin   0.1 mg Oral QHS   dexamethasone   4 mg Oral Q8H   feeding supplement  237 mL Oral BID BM   insulin  aspart  0-5 Units Subcutaneous QHS   insulin  aspart  0-6 Units Subcutaneous TID WC   [START ON 05/11/2024] insulin  glargine-yfgn  10 Units Subcutaneous Daily   levETIRAcetam   750 mg Oral BID   pantoprazole   40 mg Oral Daily   polyethylene glycol  17 g Oral BID   simvastatin   40 mg Oral q1800    PRN meds: acetaminophen , ALPRAZolam , docusate sodium , hydrALAZINE , HYDROmorphone  (DILAUDID ) injection, melatonin, mouth rinse, prochlorperazine    Infusions:    Antimicrobials: Anti-infectives (From admission,  onward)    Start     Dose/Rate Route Frequency Ordered Stop   05/01/24 2200  cefTRIAXone  (ROCEPHIN ) 1 g in sodium chloride  0.9 % 100 mL IVPB        1 g 200 mL/hr over 30 Minutes Intravenous Every 24 hours 05/01/24 0439 05/06/24 0751   04/30/24 2300  cefTRIAXone  (ROCEPHIN ) 1 g in sodium chloride  0.9 % 100 mL IVPB        1 g 200 mL/hr over 30 Minutes Intravenous  Once 04/30/24 2255 04/30/24 2329       Objective: Vitals:   05/10/24 0445 05/10/24 1418  BP: 123/68 (!) 153/74  Pulse: (!) 54 (!) 57  Resp: 16 19  Temp: 97.9 F (36.6 C) (!) 97.5 F (36.4 C)  SpO2: 99% 98%    Intake/Output Summary (Last 24 hours) at 05/10/2024 1522 Last data filed at 05/10/2024 1317 Gross per 24 hour  Intake 240 ml  Output 750 ml  Net -510 ml   Filed Weights   04/30/24 2001 04/30/24 2015  Weight: 89.6 kg 89.6 kg   Weight change:  Body mass index is  33.38 kg/m.   Physical Exam: General exam: Pleasant, elderly Caucasian female. Skin: No rashes, lesions or ulcers. HEENT: Atraumatic, normocephalic, no obvious bleeding Lungs: Clear to auscultation bilaterally,  CVS: S1, S2, no murmur,   GI/Abd: Soft, nontender, nondistended, bowel sound present,   CNS: Alert, awake, oriented x 3 Psychiatry: Sad affect Extremities: No pedal edema, no calf tenderness,   Data Review: I have personally reviewed the laboratory data and studies available.  F/u labs ordered Unresulted Labs (From admission, onward)     Start     Ordered   05/11/24 0500  CBC with Differential/Platelet  Tomorrow morning,   R       Question:  Specimen collection method  Answer:  Lab=Lab collect   05/10/24 1522   05/11/24 0500  Basic metabolic panel with GFR  Tomorrow morning,   R       Question:  Specimen collection method  Answer:  Lab=Lab collect   05/10/24 1522            Signed, Chapman Rota, MD [facility] 05/10/2024  "

## 2024-05-11 ENCOUNTER — Other Ambulatory Visit: Payer: Self-pay | Admitting: Neurology

## 2024-05-11 ENCOUNTER — Ambulatory Visit: Admitting: Radiation Oncology

## 2024-05-11 DIAGNOSIS — R911 Solitary pulmonary nodule: Secondary | ICD-10-CM | POA: Diagnosis not present

## 2024-05-11 LAB — BASIC METABOLIC PANEL WITH GFR
Anion gap: 7 (ref 5–15)
BUN: 32 mg/dL — ABNORMAL HIGH (ref 8–23)
CO2: 26 mmol/L (ref 22–32)
Calcium: 8.9 mg/dL (ref 8.9–10.3)
Chloride: 104 mmol/L (ref 98–111)
Creatinine, Ser: 1.06 mg/dL — ABNORMAL HIGH (ref 0.44–1.00)
GFR, Estimated: 54 mL/min — ABNORMAL LOW
Glucose, Bld: 234 mg/dL — ABNORMAL HIGH (ref 70–99)
Potassium: 5.2 mmol/L — ABNORMAL HIGH (ref 3.5–5.1)
Sodium: 137 mmol/L (ref 135–145)

## 2024-05-11 LAB — GLUCOSE, CAPILLARY
Glucose-Capillary: 217 mg/dL — ABNORMAL HIGH (ref 70–99)
Glucose-Capillary: 233 mg/dL — ABNORMAL HIGH (ref 70–99)
Glucose-Capillary: 209 mg/dL — ABNORMAL HIGH (ref 70–99)
Glucose-Capillary: 175 mg/dL — ABNORMAL HIGH (ref 70–99)

## 2024-05-11 LAB — CBC WITH DIFFERENTIAL/PLATELET
Abs Immature Granulocytes: 0.16 K/uL — ABNORMAL HIGH (ref 0.00–0.07)
Basophils Absolute: 0 K/uL (ref 0.0–0.1)
Basophils Relative: 0 %
Eosinophils Absolute: 0 K/uL (ref 0.0–0.5)
Eosinophils Relative: 0 %
HCT: 32.7 % — ABNORMAL LOW (ref 36.0–46.0)
Hemoglobin: 10.7 g/dL — ABNORMAL LOW (ref 12.0–15.0)
Immature Granulocytes: 2 %
Lymphocytes Relative: 9 %
Lymphs Abs: 1 K/uL (ref 0.7–4.0)
MCH: 30.5 pg (ref 26.0–34.0)
MCHC: 32.7 g/dL (ref 30.0–36.0)
MCV: 93.2 fL (ref 80.0–100.0)
Monocytes Absolute: 0.7 K/uL (ref 0.1–1.0)
Monocytes Relative: 7 %
Neutro Abs: 8.8 K/uL — ABNORMAL HIGH (ref 1.7–7.7)
Neutrophils Relative %: 82 %
Platelets: 214 K/uL (ref 150–400)
RBC: 3.51 MIL/uL — ABNORMAL LOW (ref 3.87–5.11)
RDW: 16 % — ABNORMAL HIGH (ref 11.5–15.5)
WBC: 10.6 K/uL — ABNORMAL HIGH (ref 4.0–10.5)
nRBC: 0 % (ref 0.0–0.2)

## 2024-05-11 NOTE — Plan of Care (Addendum)
" ° °  Medical records reviewed. Patient assessed at the bedside.  She was eating her lunch.  Introduced myself as a palliative medicine team PA and offered to assist with GOC discussion, however she stated now is not a good time. She is agreeable to another visit tomorrow.  Provided PMT contact information.  Thank you for your referral and allowing PMT to assist in Sonya Solomon's care.   Navi Ewton, PA-C Palliative Medicine Team  Team Phone # [phone]   NO CHARGE   "

## 2024-05-11 NOTE — Consult Note (Addendum)
 [facility] CONSULT NOTE  Patient Care Team: Leonel Cole, MD as PCP - General (Family Medicine)  CHIEF COMPLAINTS/PURPOSE OF CONSULTATION:  newly diagnosed lung adenocarcinoma with multiple brain mets.   REFERRING PHYSICIAN: Dr. Arlice  HISTORY OF PRESENTING ILLNESS:  Sonya Solomon 79 y.o. female presented on 04/30/2024 with generalized weakness, dizziness and fall at home.  During hospitalization, workup including imaging has been done.  CT showed multiple intracranial lesions with subsequent MRI brain confirming multiple nodules.  Bronchoscopy was done with cytology showing adenocarcinoma. Therefore oncology evaluation has been requested. Patient is seen awake and alert laying in bed.  She is a limited historian who does not recall her medical history in detail.  She confirms being admitted because she fell at home, denies hitting her head.  States that she uses always uses a walker to ambulate and came down hard while ambulating with her walker.  Medical history is being taken from documentation. Medical history includes hypertension, diabetes, and multiple sclerosis. Surgical history includes cholecystectomy, appendectomy, bowel resection, breast reduction, and bilateral cataract surgery. Family oncologic history includes mother who had breast cancer. Social history is noncontributory.  Admits to smoking cigarettes for very short time many years ago, I do not remember when it was.  Denies alcohol use and denies recreational or illicit drug use.  Does not recall if and where she worked outside of the home and if she was exposed to any occupational hazardous materials.  Lives at home with her husband in the [place] area of [place].     I have reviewed her chart and materials related to her cancer extensively and collaborated history with the patient. Summary of oncologic history is as follows: Oncology History   No problem history exists.    ASSESSMENT & PLAN:   Lung adenocarcinoma, right middle lobe with brain mets - Newly diagnosed - Imaging done this hospitalization showed numerous (approximately 45) enhancing nodules and lesions within cerebral and cerebellar hemispheres, suspicious for mets. - Bronchoscopy was done.  Cytology done 05/09/2024 confirms adenocarcinoma - Agree with antiseizure meds - Recommend radiation oncology evaluation - Medical oncology/Dr. Sherrod will make further recommendations.  Anemia, normocytic - Hemoglobin 10.7 - No transfusional intervention required - Continue to monitor CBC with differential  Leukocytosis - Likely secondary to steroid effect - WBC 10.6 - Afebrile, monitor fever curve  AKI on CKD - Avoid nephrotoxic agents - Continue to monitor renal function  Diabetes Hypertension - Continue to monitor blood glucose levels closely - Continue to monitor blood pressure  History of multiple sclerosis - Continue neurology follow-up   MEDICAL HISTORY:  Past Medical History:  Diagnosis Date   Diabetes mellitus without complication (HCC)    Hypertension    MS (multiple sclerosis)     SURGICAL HISTORY: Past Surgical History:  Procedure Laterality Date   ABDOMINAL HYSTERECTOMY     APPENDECTOMY     BOWEL RESECTION  1996   BREAST REDUCTION SURGERY  07/10/1986   CATARACT EXTRACTION, BILATERAL     12/21/19, 09/19.21   CHOLECYSTECTOMY  05/2004   DE QUERVAIN'S RELEASE  05/18/2014   RADIAL STYLOIDECTOMY WRIST Left 05/10/2012   REDUCTION MAMMAPLASTY     TRIGGER FINGER RELEASE Left 01/09/2011   UMBILICAL HERNIA REPAIR  08/10/2006   VIDEO BRONCHOSCOPY WITH ENDOBRONCHIAL NAVIGATION Right 05/09/2024   Procedure: VIDEO BRONCHOSCOPY WITH ENDOBRONCHIAL NAVIGATION;  Surgeon: Shelah Lamar RAMAN, MD;  Location: [facility] END[facility];  Service: Pulmonary;  Laterality: Right;    SOCIAL HISTORY: Social History  Socioeconomic History   Marital status: Married    Spouse name: Not on file   Number of children: Not on file    Years of education: Not on file   Highest education level: Not on file  Occupational History   Not on file  Tobacco Use   Smoking status: Never   Smokeless tobacco: Never  Vaping Use   Vaping status: Never Used  Substance and Sexual Activity   Alcohol use: Never   Drug use: Never   Sexual activity: Not Currently  Other Topics Concern   Not on file  Social History Narrative   Lives with husband   Caffeine use: 2 cups per day   Right handed    Social Drivers of Health   Tobacco Use: Low Risk (05/09/2024)   Patient History    Smoking Tobacco Use: Never    Smokeless Tobacco Use: Never    Passive Exposure: Not on file  Financial Resource Strain: Not on file  Food Insecurity: No Food Insecurity (05/01/2024)   Epic    Worried About Programme Researcher, Broadcasting/film/video in the Last Year: Never true    Ran Out of Food in the Last Year: Never true  Transportation Needs: No Transportation Needs (05/01/2024)   Epic    Lack of Transportation (Medical): No    Lack of Transportation (Non-Medical): No  Physical Activity: Not on file  Stress: Not on file  Social Connections: Moderately Integrated (05/01/2024)   Social Connection and Isolation Panel    Frequency of Communication with Friends and Family: More than three times a week    Frequency of Social Gatherings with Friends and Family: Never    Attends Religious Services: Never    Database Administrator or Organizations: Yes    Attends Banker Meetings: Never    Marital Status: Married  Catering Manager Violence: Not At Risk (05/01/2024)   Epic    Fear of Current or Ex-Partner: No    Emotionally Abused: No    Physically Abused: No    Sexually Abused: No  Depression (PHQ2-9): Not on file  Alcohol Screen: Not on file  Housing: Low Risk (05/01/2024)   Epic    Unable to Pay for Housing in the Last Year: No    Number of Times Moved in the Last Year: 0    Homeless in the Last Year: No  Utilities: Not At Risk (05/01/2024)   Epic     Threatened with loss of utilities: No  Health Literacy: Not on file    FAMILY HISTORY: Family History  Problem Relation Age of Onset   Breast cancer Mother    Heart attack Father      PHYSICAL EXAMINATION: ECOG PERFORMANCE STATUS: 3 - Symptomatic, >50% confined to bed  Vitals:   05/10/24 1947 05/11/24 0448  BP: 133/69 (!) 153/77  Pulse: (!) 58 (!) 52  Resp: 16 16  Temp: 98.1 F (36.7 C) (!) 97.3 F (36.3 C)  SpO2: 99% 100%   Filed Weights   04/30/24 2001 04/30/24 2015  Weight: 197 lb 8.5 oz (89.6 kg) 197 lb 8.5 oz (89.6 kg)    GENERAL: alert, no distress and comfortable +elderly appearing SKIN: +Pale skin color, texture, turgor are normal, no rashes or significant lesions EYES: normal, conjunctiva are pink and non-injected, sclera clear OROPHARYNX: no exudate, no erythema and lips, buccal mucosa, and tongue normal  NECK: supple, thyroid  normal size, non-tender, without nodularity LYMPH: no palpable lymphadenopathy in the cervical, axillary or inguinal  LUNGS: clear to auscultation and percussion with normal breathing effort HEART: regular rate & rhythm and no murmurs and no lower extremity edema ABDOMEN: abdomen soft, non-tender and normal bowel sounds MUSCULOSKELETAL: no cyanosis of digits and no clubbing  PSYCH: alert & oriented x 3 with fluent speech +limited historian NEURO: no focal motor/sensory deficits   ALLERGIES:  is allergic to latex.  MEDICATIONS:  Current Facility-Administered Medications  Medication Dose Route Frequency Provider Last Rate Last Admin   acetaminophen  (TYLENOL ) tablet 500 mg  500 mg Oral Q6H PRN Shelah Lamar RAMAN, MD   500 mg at 05/01/24 9066   ALPRAZolam  (XANAX ) tablet 0.25 mg  0.25 mg Oral TID PRN Shelah Lamar RAMAN, MD   0.25 mg at 05/10/24 1355   Chlorhexidine  Gluconate Cloth 2 % PADS 6 each  6 each Topical Daily Shelah Lamar RAMAN, MD   6 each at 05/10/24 0931   desmopressin  (DDAVP ) tablet 0.1 mg  0.1 mg Oral QHS Byrum, Robert S, MD   0.1  mg at 05/10/24 2037   dexamethasone  (DECADRON ) tablet 4 mg  4 mg Oral Q8H Byrum, Robert S, MD   4 mg at 05/11/24 0525   docusate sodium  (COLACE) capsule 100 mg  100 mg Oral BID PRN Shelah Lamar RAMAN, MD   100 mg at 05/07/24 1515   feeding supplement (ENSURE PLUS HIGH PROTEIN) liquid 237 mL  237 mL Oral BID BM Byrum, Robert S, MD   237 mL at 05/11/24 9075   hydrALAZINE  (APRESOLINE ) injection 5 mg  5 mg Intravenous Q6H PRN Shelah Lamar RAMAN, MD       HYDROmorphone  (DILAUDID ) injection 0.5 mg  0.5 mg Intravenous Q3H PRN Shelah Lamar RAMAN, MD   0.5 mg at 05/11/24 0530   insulin  aspart (novoLOG ) injection 0-5 Units  0-5 Units Subcutaneous QHS Shelah Lamar RAMAN, MD   2 Units at 05/10/24 2038   insulin  aspart (novoLOG ) injection 0-6 Units  0-6 Units Subcutaneous TID WC Shelah Lamar RAMAN, MD   2 Units at 05/11/24 9182   insulin  glargine-yfgn (SEMGLEE ) injection 10 Units  10 Units Subcutaneous Daily Dahal, Chapman, MD   10 Units at 05/11/24 0926   levETIRAcetam  (KEPPRA ) tablet 750 mg  750 mg Oral BID Byrum, Robert S, MD   750 mg at 05/11/24 9075   melatonin tablet 5 mg  5 mg Oral QHS PRN Byrum, Robert S, MD   5 mg at 05/03/24 2248   Oral care mouth rinse  15 mL Mouth Rinse PRN Dahal, Chapman, MD       pantoprazole  (PROTONIX ) EC tablet 40 mg  40 mg Oral Daily Byrum, Robert S, MD   40 mg at 05/11/24 9075   polyethylene glycol (MIRALAX  / GLYCOLAX ) packet 17 g  17 g Oral BID Byrum, Robert S, MD   17 g at 05/07/24 1003   prochlorperazine  (COMPAZINE ) injection 5 mg  5 mg Intravenous Q6H PRN Shelah Lamar RAMAN, MD       simvastatin  (ZOCOR ) tablet 40 mg  40 mg Oral q1800 Byrum, Robert S, MD   40 mg at 05/10/24 1714     LABORATORY DATA:  I have reviewed the data as listed Lab Results  Component Value Date   WBC 10.6 (H) 05/11/2024   HGB 10.7 (L) 05/11/2024   HCT 32.7 (L) 05/11/2024   MCV 93.2 05/11/2024   PLT 214 05/11/2024   Recent Labs    05/06/24 1011 05/07/24 1820 05/08/24 0527 05/11/24 0651  NA 139 139 139  137  K 4.6 4.4 4.5 5.2*  CL 110 109 110 104  CO2 17* 19* 20* 26  GLUCOSE 265* 209* 227* 234*  BUN 53* 42* 38* 32*  CREATININE 1.56* 1.32* 1.16* 1.06*  CALCIUM 9.0 9.0 8.8* 8.9  GFRNONAA 34* 42* 49* 54*  PROT 6.0* 5.7* 5.4*  --   ALBUMIN  3.5 3.4* 3.2*  --   AST 46* 38 27  --   ALT 70* 67* 62*  --   ALKPHOS 42 40 37*  --   BILITOT 0.2 0.2 0.2  --     RADIOGRAPHIC STUDIES: I have personally reviewed the radiological images as listed and agreed with the findings in the report. DG Chest Port 1 View Result Date: 05/09/2024 CLINICAL DATA:  Post bronchoscopy. EXAM: PORTABLE CHEST 1 VIEW COMPARISON:  Chest x-ray 05/06/2024 and chest CT 05/07/2024 FINDINGS: Lungs are hypoinflated with slight increased opacification over the right base in the region of patient's known right middle lobe nodule likely representing post bronchoscopy changes. Subtle opacification/blunting over the left costophrenic angle likely atelectasis. No pneumothorax. Cardiomediastinal silhouette and remainder of the exam is unchanged. IMPRESSION: Hypoinflation with slight increased opacification over the right base in the region of patient's known right middle lobe nodule likely representing post bronchoscopy changes. No pneumothorax. Electronically Signed   By: Toribio Agreste M.D.   On: 05/09/2024 13:34   DG C-ARM BRONCHOSCOPY Result Date: 05/09/2024 C-ARM BRONCHOSCOPY: Fluoroscopy was utilized by the requesting physician.  No radiographic interpretation.   DG C-Arm 1-60 Min-No Report Result Date: 05/09/2024 Fluoroscopy was utilized by the requesting physician.  No radiographic interpretation.   US  THYROID  Result Date: 05/08/2024 CLINICAL DATA:  Incidental on CT. [id number] Multiple thyroid  nodules [id number] EXAM: THYROID  ULTRASOUND TECHNIQUE: Ultrasound examination of the thyroid  gland and adjacent soft tissues was performed. COMPARISON:  CT chest, 05/07/2024. FINDINGS: Parenchymal Echotexture: Normal Isthmus: 0.5 cm Right lobe: 4.7 x 3.0  x 2.7 cm Left lobe: 3.9 x 2.0 x 1.3 cm _________________________________________________________ Estimated total number of nodules >/= 1 cm: 1 Number of spongiform nodules >/=  2 cm not described below (TR1): 0 Number of mixed cystic and solid nodules >/= 1.5 cm not described below (TR2): 0 _________________________________________________________ Nodule # 1: Location: RIGHT; Mid Maximum size: 3.3 cm; Other 2 dimensions: 2.5 x 2.5 cm Composition: mixed cystic and solid (1) Echogenicity: anechoic (0) Shape: not taller-than-wide (0) Margins: ill-defined (0) Echogenic foci: none (0) ACR TI-RADS total points: 1. ACR TI-RADS risk category: TR1 (0-1 points). ACR TI-RADS recommendations: This nodule does NOT meet TI-RADS criteria for biopsy or dedicated follow-up. _________________________________________________________ No cervical adenopathy or abnormal fluid collection within the imaged neck. IMPRESSION: Solitary, 3.3 cm RIGHT mid TR-1 thyroid  nodule This nodule does NOT meet TI-RADS criteria for biopsy or dedicated follow-up. The above is in keeping with the ACR TI-RADS recommendations - J Am Coll Radiol 2017;14:587-595. Electronically Signed   By: Thom Hall M.D.   On: 05/08/2024 07:06   CT CHEST WO CONTRAST Result Date: 05/07/2024 CLINICAL DATA:  Intracranial metastases. EXAM: CT CHEST WITHOUT CONTRAST TECHNIQUE: Multidetector CT imaging of the chest was performed following the standard protocol without IV contrast. RADIATION DOSE REDUCTION: This exam was performed according to the departmental dose-optimization program which includes automated exposure control, adjustment of the mA and/or kV according to patient size and/or use of iterative reconstruction technique. COMPARISON:  Yesterday FINDINGS: Cardiovascular: Aortic atherosclerosis. Mild cardiomegaly. No pericardial effusion. Coronary artery calcifications are noted. Mediastinum/Nodes: 3 cm right thyroid  nodule is noted. Esophagus and trachea  are  unremarkable. No definite adenopathy is noted, although evaluation is limited due to lack of intravenous contrast. Lungs/Pleura: No pneumothorax is noted. Small bilateral pleural effusions are noted with adjacent subsegmental atelectasis. 18 x 14 mm irregular nodular density is noted laterally in right middle lobe best seen in image number 106 of series 4, concerning for primary pulmonary malignancy. PET scan is recommended for further evaluation. Upper Abdomen: No acute abnormality. Musculoskeletal: No chest wall mass or suspicious bone lesions identified. IMPRESSION: 1. 18 x 14 mm irregular nodular density is noted laterally in right middle lobe concerning for primary pulmonary malignancy. PET scan is recommended for further evaluation. 2. Small bilateral pleural effusions are noted with adjacent subsegmental atelectasis. 3. 3 cm right thyroid  nodule is noted. Recommend thyroid  US . (Ref: J Am Coll Radiol. 2015 Feb;12(2): 143-50). 4. Coronary artery calcifications are noted. 5. Aortic atherosclerosis. Aortic Atherosclerosis (ICD10-I70.0). Electronically Signed   By: Lynwood Landy Raddle M.D.   On: 05/07/2024 11:06   DG Chest 1 View Result Date: 05/06/2024 EXAM: 1 VIEW(S) XRAY OF THE CHEST 05/06/2024 09:00:00 AM COMPARISON: 05/04/2024 CLINICAL HISTORY: SOB (shortness of breath) FINDINGS: LUNGS AND PLEURA: Decreased interstitial edema compared with the previous exam. No focal pulmonary opacity. No pleural effusion. No pneumothorax. HEART AND MEDIASTINUM: Cardiomegaly. Aortic arch atherosclerosis. BONES AND SOFT TISSUES: No acute osseous abnormality. IMPRESSION: 1. No acute findings. 2. Resolved interstitial edema compared with the previous exam. 3. Cardiomegaly. Electronically signed by: Waddell Calk MD 05/06/2024 12:55 PM EST RP Workstation: [id number]   MR BRAIN W WO CONTRAST Result Date: 05/06/2024 EXAM: MRI BRAIN WITH AND WITHOUT CONTRAST 05/06/2024 08:57:23 AM TECHNIQUE: Multiplanar multisequence MRI of the  head/brain was performed with and without the administration of 9 mL gadobutrol  (GADAVIST ) 1 MMOL/ML injection. COMPARISON: MRI of the head dated 04/20/2020 and CT of the head dated 05/05/2024. CLINICAL HISTORY: Diplopia. FINDINGS: BRAIN AND VENTRICLES: No acute infarct. No acute intracranial hemorrhage. No mass effect or midline shift. No hydrocephalus. The sella is unremarkable. Normal flow voids. There are numerous enhancing nodules and ring enhancing lesions within the cerebral and cerebellar hemispheres bilaterally, as demonstrated on the previous CT, which are highly suspicious for metastatic disease. The largest lesion is situated within the left parietal lobe, measuring approximately 3.2 x 2.0 x 3.6 cm. There are approximately 45 enhancing lesions within the cerebral and cerebellar hemispheres. There is a lesion within the left thalamus and left cerebral peduncle seen on image 16 of series 18, measuring approximately 14 x 11 x 14 mm. There is also a lesion within the left basal ganglia measuring approximately 15 x 12 x 11 mm. None of the lesions demonstrate restricted diffusion. There is extensive cerebral white matter disease present. ORBITS: No acute abnormality. SINUSES: No acute abnormality. BONES AND SOFT TISSUES: Normal bone marrow signal and enhancement. No acute soft tissue abnormality. IMPRESSION: 1. Numerous (approximately 45) enhancing nodules and ring enhancing lesions within the cerebral and cerebellar hemispheres bilaterally, highly suspicious for metastatic disease, with the largest lesion in the left parietal lobe measuring approximately 3.2 x 2.0 x 3.6 cm. 2. Extensive cerebral white matter disease. Electronically signed by: Evalene Coho MD 05/06/2024 10:15 AM EST RP Workstation: [id number]   CT HEAD WO CONTRAST ( ) Result Date: 05/05/2024 EXAM: CT HEAD WITHOUT CONTRAST 05/05/2024 08:11:00 PM TECHNIQUE: CT of the head was performed without the administration of intravenous contrast.  Automated exposure control, iterative reconstruction, and/or weight based adjustment of the mA/kV was utilized to reduce the radiation dose to as  low as reasonably achievable. COMPARISON: MRI head 04/20/2020. CLINICAL HISTORY: Diplopia; Headache, sudden, severe. FINDINGS: BRAIN AND VENTRICLES: No acute hemorrhage. No evidence of acute infarct. No hydrocephalus. No extra-axial collection. No midline shift. Local mass effect is present associated with the dominant left parietal lesion. Multiple somewhat heterogeneous hyperattenuating lesions throughout the cerebral hemispheres concerning for intracranial metastatic disease. Dominant lesion in the high left parietal lobe near the vertex measures 2.6 x 2.1 x 2.9 cm. There is surrounding vasogenic edema and local mass effect. Additional 1.2 cm lesion in the right occipital lobe (series 2, image 14). 1.0 cm lesion in the left cerebral peduncle with edema in the midbrain also extending into the left thalamus. There is a 1.4 cm hyperattenuating lesion in the right parietal lobe with possible adjacent satellite lesion and associated vasogenic edema. Additional lesions noted in the left frontal lobe and in the left basal ganglia. There are additional areas of hypoattenuation in the supratentorial white matter which may reflect a combination of chronic microvascular disease as well as additional areas of vasogenic edema. MRI of the brain with and without contrast is recommended for further evaluation of intracranial lesions. ORBITS: Bilateral lens replacement noted. SINUSES: No acute abnormality. SOFT TISSUES AND SKULL: No acute soft tissue abnormality. No skull fracture. IMPRESSION: 1. 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 Associated vasogenic edema and local mass effect. No midline shift. 2. MRI of the brain with and without contrast is recommended for further evaluation of the  intracranial lesions. Electronically signed by: Donnice Mania MD 05/05/2024 08:25 PM EST RP Workstation: [id number]   DG Chest 1 View Result Date: 05/04/2024 CLINICAL DATA:  Shortness of breath. EXAM: CHEST  1 VIEW COMPARISON:  04/30/2024 FINDINGS: Lung volumes are low.The cardiomediastinal contours are stable. Bronchovascular crowding related to low lung volumes. No consolidation, pleural effusion, or pneumothorax. No acute osseous abnormalities are seen. IMPRESSION: Low lung volumes without acute abnormality. Electronically Signed   By: Andrea Gasman M.D.   On: 05/04/2024 14:15   US  RENAL Result Date: 05/04/2024 EXAM: US  Retroperitoneum Complete, Renal. 05/03/2024 03:06:29 PM TECHNIQUE: Real-time ultrasonography of the retroperitoneum renal was performed. COMPARISON: None available CLINICAL HISTORY: Acute kidney injury, ascites. FINDINGS: FINDINGS: RIGHT KIDNEY/URETER: Right kidney measures 11.7 x 5.0 x 5.1 cm. The right renal parenchyma is isoechoic to the liver. No hydronephrosis. No calculus. No mass. LEFT KIDNEY/URETER: Left kidney measures 10.0 x 5.6 x 5.1 cm. The left renal parenchyma is isoechoic to the spleen. No hydronephrosis. No calculus. No mass. BLADDER: A foley catheter is in place. Gallbladder is collapsed. IMPRESSION: 1. The renal parenchyma is somewhat hyperechoic bilaterally. This is nonspecific, but can be seen in the setting of medical renal disease. 2. Foley catheter in situ. Electronically signed by: Lonni Necessary MD 05/04/2024 08:52 AM EST RP Workstation: [id number]   ECHOCARDIOGRAM COMPLETE Result Date: 05/01/2024    ECHOCARDIOGRAM REPORT   Patient Name:   Sonya Solomon Date of Exam: 05/01/2024 Medical Rec #:  [id number]       Height:       64.5 in Accession #:    [id number]      Weight:       197.5 lb Date of Birth:  07-Nov-1945        BSA:          1.956 m Patient Age:    78 years        BP:  97/69 mmHg Patient Gender: F               HR:           73 bpm. Exam  Location:  Inpatient Procedure: 2D Echo, Cardiac Doppler, Color Doppler and Intracardiac            Opacification Agent (Both Spectral and Color Flow Doppler were            utilized during procedure). Indications:    Elevated troponin  History:        Patient has no prior history of Echocardiogram examinations.                 Risk Factors:Hypertension and Diabetes.  Sonographer:    Merlynn Manas Referring Phys: [id number] TERRY LOISE HURST  Sonographer Comments: Image acquisition challenging due to respiratory motion. IMPRESSIONS  1. Significant LV hypertrophy, especially apically. 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 CMR may be helpful for further evaluation. Left ventricular ejection fraction, by estimation, is 70 to 75%. The left ventricle has hyperdynamic function. The left ventricle has no regional wall motion abnormalities. There is moderate concentric left ventricular hypertrophy. Left ventricular diastolic parameters are consistent with Grade I diastolic dysfunction (impaired relaxation).  2. Right ventricular systolic function is normal. The right ventricular size is normal. Mildly increased right ventricular wall thickness. There is severely elevated pulmonary artery systolic pressure. The estimated right ventricular systolic pressure is 68.0 mmHg.  3. Left atrial size was mildly dilated.  4. The mitral valve is normal in structure. Mild mitral valve regurgitation. No evidence of mitral stenosis.  5. The aortic valve is normal in structure. Aortic valve regurgitation is not visualized. Mild aortic valve stenosis. Aortic valve Vmax measures 2.24 m/s.  6. The inferior vena cava is normal in size with greater than 50% respiratory variability, suggesting right atrial pressure of 3 mmHg. FINDINGS  Left Ventricle: Significant LV hypertrophy, especially apically. 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 CMR may be helpful for further evaluation. Left ventricular ejection fraction, by estimation,  is 70 to 75%. The left ventricle has hyperdynamic function. The left ventricle has no regional wall motion abnormalities. Definity  contrast agent was given IV to delineate the left ventricular endocardial borders. The left ventricular internal cavity size was normal in size. There is moderate concentric left ventricular hypertrophy. Left ventricular diastolic parameters are consistent with Grade I diastolic dysfunction (impaired relaxation). Right Ventricle: The right ventricular size is normal. Mildly increased right ventricular wall thickness. Right ventricular systolic function is normal. There is severely elevated pulmonary artery systolic pressure. The tricuspid regurgitant velocity is 4.03 m/s, and with an assumed right atrial pressure of 3 mmHg, the estimated right ventricular systolic pressure is 68.0 mmHg. Left Atrium: Left atrial size was mildly dilated. Right Atrium: Right atrial size was normal in size. Pericardium: There is no evidence of pericardial effusion. Mitral Valve: The mitral valve is normal in structure. Mild mitral valve regurgitation. No evidence of mitral valve stenosis. Tricuspid Valve: The tricuspid valve is normal in structure. Tricuspid valve regurgitation is mild . No evidence of tricuspid stenosis. Aortic Valve: The aortic valve is normal in structure. Aortic valve regurgitation is not visualized. Mild aortic stenosis is present. Aortic valve mean gradient measures 10.0 mmHg. Aortic valve peak gradient measures 20.1 mmHg. Aortic valve area, by VTI measures 2.32 cm. Pulmonic Valve: The pulmonic valve was normal in structure. Pulmonic valve regurgitation is not visualized. No evidence of pulmonic stenosis.  Aorta: The aortic root is normal in size and structure. Venous: The inferior vena cava is normal in size with greater than 50% respiratory variability, suggesting right atrial pressure of 3 mmHg. IAS/Shunts: No atrial level shunt detected by color flow Doppler.  LEFT VENTRICLE PLAX  2D LVIDd:         5.00 cm   Diastology LVIDs:         2.90 cm   LV e' medial:    4.90 cm/s LV PW:         0.90 cm   LV E/e' medial:  11.9 LV IVS:        0.90 cm   LV e' lateral:   7.18 cm/s LVOT diam:     2.00 cm   LV E/e' lateral: 8.1 LV SV:         99 LV SV Index:   50 LVOT Area:     3.14 cm  RIGHT VENTRICLE             IVC RV Basal diam:  3.70 cm     IVC diam: 1.60 cm RV S prime:     18.60 cm/s TAPSE (M-mode): 2.5 cm      PULMONARY VEINS                             Diastolic Velocity: 51.40 cm/s                             S/D Velocity:       1.40                             Systolic Velocity:  71.10 cm/s LEFT ATRIUM             Index        RIGHT ATRIUM           Index LA diam:        3.90 cm 1.99 cm/m   RA Area:     15.60 cm LA Vol (A2C):   52.0 ml 26.58 ml/m  RA Volume:   42.70 ml  21.82 ml/m LA Vol (A4C):   62.2 ml 31.79 ml/m LA Biplane Vol: 59.4 ml 30.36 ml/m  AORTIC VALVE AV Area (Vmax):    2.64 cm AV Area (Vmean):   2.95 cm AV Area (VTI):     2.32 cm AV Vmax:           224.00 cm/s AV Vmean:          148.000 cm/s AV VTI:            0.425 m AV Peak Grad:      20.1 mmHg AV Mean Grad:      10.0 mmHg LVOT Vmax:         188.00 cm/s LVOT Vmean:        139.000 cm/s LVOT VTI:          0.314 m LVOT/AV VTI ratio: 0.74  AORTA Ao Root diam: 3.50 cm Ao Asc diam:  3.80 cm MITRAL VALVE               TRICUSPID VALVE MV Area (PHT): 3.14 cm    TR Peak grad:   65.0 mmHg MV E velocity: 58.10 cm/s  TR Vmax:        403.00 cm/s MV A  velocity: 85.70 cm/s MV E/A ratio:  0.68        SHUNTS                            Systemic VTI:  0.31 m                            Systemic Diam: 2.00 cm Morene Brownie Electronically signed by Morene Brownie Signature Date/Time: 05/01/2024/1:42:17 PM    Final    CT ABDOMEN PELVIS WO CONTRAST Result Date: 04/30/2024 EXAM: CT ABDOMEN AND PELVIS WITHOUT CONTRAST 04/30/2024 09:16:11 PM TECHNIQUE: CT of the abdomen and pelvis was performed without the administration of intravenous  contrast. Multiplanar reformatted images are provided for review. Automated exposure control, iterative reconstruction, and/or weight-based adjustment of the mA/kV was utilized to reduce the radiation dose to as low as reasonably achievable. COMPARISON: None available. CLINICAL HISTORY: Bowel obstruction suspected; Sepsis. 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 LIVER: The liver is unremarkable. GALLBLADDER AND BILE DUCTS: Gallbladder is not visualized. No biliary ductal dilatation. SPLEEN: No acute abnormality. PANCREAS: No acute abnormality. ADRENAL GLANDS: There is an intramural left adrenal nodule measuring 1.9 x 1.4 cm. KIDNEYS, URETERS AND BLADDER: No stones in the kidneys or ureters. No hydronephrosis. No perinephric or periureteral stranding. Urinary bladder is unremarkable. GI AND BOWEL: Stomach demonstrates no acute abnormality. Sigmoid colon anastomosis is present. There is sigmoid and descending colon diverticulosis. There is circumferential wall thickening of the entire colon more significant proximally. There is mild inflammatory stranding surrounding the ascending colon. The appendix was not definitively visualized. There is no bowel obstruction. PERITONEUM AND RETROPERITONEUM: No ascites. No free air. VASCULATURE: Aorta is normal in caliber. There are atherosclerotic calcifications of the aorta and iliac arteries. LYMPH NODES: No lymphadenopathy. REPRODUCTIVE ORGANS: Uterus is surgically absent. BONES AND SOFT TISSUES: No acute osseous abnormality. No focal soft tissue abnormality. IMPRESSION: 1. Pancolitis, greatest proximally. 2. Sigmoid and descending colon diverticulosis without diverticulitis. 3. Indeterminate 1.9 cm left adrenal nodule; recommend 1-year follow-up adrenal washout CT. 4. Partially imaged intraatrial lipoma. Electronically signed by: Greig Pique MD 04/30/2024 10:06 PM EST RP Workstation: [id number]   DG Chest Port 1 View Result Date:  04/30/2024 EXAM: 1 VIEW(S) XRAY OF THE CHEST 04/30/2024 08:28:32 PM COMPARISON: None available. CLINICAL HISTORY: weakness, fall FINDINGS: LUNGS AND PLEURA: No focal pulmonary opacity. No pleural effusion. No pneumothorax. HEART AND MEDIASTINUM: Aortic arch calcifications. BONES AND SOFT TISSUES: Multilevel thoracic osteophytosis. No acute osseous abnormality. IMPRESSION: 1. No acute cardiopulmonary abnormality. Electronically signed by: Morgane Naveau MD 04/30/2024 09:12 PM EST RP Workstation: [id number]     I personally spent a total of 55 minutes minutes in the care of the patient today including preparing to see the patient, getting/reviewing separately obtained history, performing a medically appropriate exam/evaluation, referring and communicating with other health care professionals, documenting clinical information in the EHR, communicating results, and coordinating care.    All questions were answered. The patient knows to call the clinic with any problems, questions or concerns. No barriers to learning was detected.  Olam JINNY Brunner, NP 1/8/202610:04 AM   ADDENDUM: Hematology/oncology Attending:  The patient is seen and examined today.  I agree with the above note.  This is a very pleasant 79 years old white female with history of multiple sclerosis who had a fall at home and stayed on the floor for almost 4  hours before her husband was able to call EMS for further evaluation.  When she presented to the emergency department, she had CT scan of the head initially that showed multiple hyper attenuating intracranial lesions throughout the cerebral hemisphere most consistent with metastatic disease.  There was associated vasogenic edema and local mass effect but no midline shift.  This was followed by MRI of the brain that showed numerous approximately 45 enhancing nodules and ring-enhancing lesion within the cerebral and cerebellar hemisphere bilaterally highly suspicious for metastatic disease  with the largest lesion in the left parietal lobe measuring approximately 3.2 x 2.0 x 3.6 cm.  There was also extensive cerebral white matter disease.  The patient had CT scan of the chest without contrast on May 07, 2024 and that showed a 1.8 x 1.4 cm irregular nodular density laterally in the right middle lobe concerning for primary pulmonary malignancy.  There was also small bilateral pleural effusion with adjacent subsegmental atelectasis and CT centimeters right thyroid  nodule. The patient underwent bronchoscopy under the care of Dr. Shelah on May 10, 2023 and the final pathology was consistent with non-small cell lung cancer, adenocarcinoma. I was consulted to see the patient and give recommendations regarding treatment of her condition. When seen today the patient is feeling fine but continues to have weakness in the right lower extremity.  Her husband Charlie was available by phone during the visit.  I had a lengthy discussion with the patient and her husband about her current condition and possible treatment options.  The patient quit smoking more than 30 years ago.  I explained to the patient that she has stage IV (T1b, N0, M1 C) non-small cell lung cancer, adenocarcinoma presented with right middle lobe lung nodule in addition to suspicious pleural effusion and innumerable brain metastasis. I recommended for the patient to proceed with whole brain irradiation for the multiple brain metastasis and she was seen by radiation oncology. She will continue her current treatment with stable Decadron . I will request her biopsy to be sent for molecular studies and PD-L1 expression. I will arrange for the patient a follow-up appointment with me at the cancer center after discharge for further evaluation and discussion of her systemic treatment options.  She understands that she has incurable condition and all the treatment will be of palliative nature. I will also arrange for the patient to have a PET  scan on outpatient basis. The patient does not feel that she will be able to go home with her current condition and she may benefit from arrangement for rehabilitation center or skilled nursing facility with the ability to transfer her for her treatment. Thank you for allowing me to participate in the care of Ms. Cooley.  Please call if you have any questions Disclaimer: This note was dictated with voice recognition software. Similar sounding words can inadvertently be transcribed and may be missed upon review.  Sherrod MARLA Sherrod, MD

## 2024-05-11 NOTE — Progress Notes (Signed)
 " PROGRESS NOTE  CAMRY ROBELLO  DOB: 09-Feb-1946  PCP: Leonel Cole, MD FMW:[id number]  DOA: 04/30/2024  LOS: 11 days  Hospital Day: 12  Subjective: Patient was seen and examined this morning. Lying down on bed. Family not at bedside. I called and updated husband about the cancer diagnosis. This morning when I went to see her, transport was ready to take her to radiation but she refused. She claims , 'I do not know anything and nobody has talked to me.' Seems overwhelmed. I have involved palliative care as well.  Afebrile, heart rate in 50s, blood pressure in 150s, breathing on room air Labs this morning with potassium elevated 5.2, glucose level consistently over 200  Brief narrative: JAELAH HAUTH is a 79 y.o. female with PMH significant for DM2, HTN, HLD, CKD 3A, obesity, MS, gait disturbance, vertigo, urinary urgency, spasticity, insomnia Lives at home with her husband. At baseline, walks with a walker and cane and independent with ADLs  12/28, patient presented to ED after a fall, diarrhea She was found to be very dehydrated with creatinine significantly elevated to 5.4 CT abdomen pelvis showed pancolitis Admitted to [facility] course was complicated by new diagnosis of brain mets from lung primary.  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  CT head was obtained which showed multiple intracranial lesions suggestive of metastatic disease with vasogenic edema, local mass effect Follow-up MRI brain was obtained which confirmed multiple enhancing nodules within the cerebral cerebellar hemisphere bilaterally suspicious for metastatic disease. 1/4 CT chest showed 18 x 14 mm irregular nodular density in the right middle lobe.  Seen by pulmonary 1/6, underwent flexible video fiberoptic bronchoscopy with robotic assistance and biopsies.  Biopsy report showed adenocarcinoma of lung. Medical oncology and  radiation oncology involved. Currently on Keppra  750 mg twice daily and Decadron  4 mg every 8 hours,  Acute pancolitis Initially presented with diarrhea dehydration CT scan on admission showed pancolitis Patient reports improvement in diarrhea.   Continue to monitor severity and frequency of bowel movement If constipated, to use MiraLAX  and senna and Dulcolax   Hypotension H/o hypertension Hypotension resolved with fluids, midodrine  and Solu-Cortef  Lisinopril  on hold Currently also on DDAVP  0.1 mg nightly which is her home dose.  AKI on CKD 3A Hypomagnesemia Hyperkalemia Baseline creatinine 1.1.  Presented with creatinine elevated to 5.4 due to diarrhea dehydration With IV hydration, creatinine significant improved now.  Renal ultrasound without any obstruction. Magnesium  level improved with replacement. Potassium level is elevated to 5.2 today.  Continue to monitor. Recent Labs  Lab 05/05/24 0627 05/06/24 1011 05/07/24 1820 05/08/24 0527 05/11/24 0651  NA 140 139 139 139 137  K 4.2 4.6 4.4 4.5 5.2*  CL 114* 110 109 110 104  CO2 16* 17* 19* 20* 26  GLUCOSE 184* 265* 209* 227* 234*  BUN 67* 53* 42* 38* 32*  CREATININE 2.15* 1.56* 1.32* 1.16* 1.06*  CALCIUM 8.8* 9.0 9.0 8.8* 8.9  MG 1.4* 2.0  --   --   --    E. coli UTI, POA  completed treatment with Rocephin .   Acute urinary retention Foley catheter was inserted in the ED.  Patient feels very weak and wants to continue Foley for now.  I have discussed with her the risk of long-term Foley dependence.  Will offer voiding trial again once her mobility improves.      Demand ischemia with NSVT SVT- Troponin trend relatively flat 126,  141, 169 Echocardiogram negative for regional wall motion abnormality, EF 70 to 75%, grade 1 diastolic dysfunction, elevated pulmonary artery systolic pressure, LV hypertrophy severe?     Cardiology recommend outpatient follow-up  Seizure disorder Continue Keppra     Multiple  sclerosis followed by Dr. Vear neurology as an outpatient.   Seen by PT OT recommending SNF.   CIR saw the patient did not think she is a candidate for CIR. She seems to be much weaker currently than her baseline 3 weeks ago.  Needs to encourage ambulation.  Type 2 diabetes mellitus  hyperglycemia due to steroids A1c controlled at 7 on 04/30/2024 Blood sugar level running elevated due to steroids PTA meds-metformin , Actos , Ozempic  Lantus  dose has been increased to 10 units daily this morning.  Continue to monitor.  Continue SSI/Accu-Cheks Recent Labs  Lab 05/10/24 0738 05/10/24 1152 05/10/24 1652 05/10/24 1929 05/11/24 1153  GLUCAP 214* 254* 208* 240* 233*   Hyperlipidemia  continue Zocor   Anxiety/depression Xanax  0.25 mg 3 times daily as needed    Nutrition Status:         Mobility:   PT Orders:   PT Follow up Rec: Skilled Nursing-Short Term Rehab (<3 Hours/Day)05/05/2024 1211    Goals of care   Code Status: Full Code  Palliative care consulted    DVT prophylaxis:  Place and maintain sequential compression device Start: 05/10/24 1520   Antimicrobials: None currently Fluid: None Consultants: Oncology, palliative care Family Communication: None at bedside  Status: Inpatient Level of care:  Med-Surg   Patient is from: Home Needs to continue in-hospital care: Cancer care started. Anticipated d/c to: SNF recommended by PT   Diet:  Diet Order             Diet Carb Modified Room service appropriate? Yes  Diet effective now                   Scheduled Meds:  Chlorhexidine  Gluconate Cloth  6 each Topical Daily   desmopressin   0.1 mg Oral QHS   dexamethasone   4 mg Oral Q8H   feeding supplement  237 mL Oral BID BM   insulin  aspart  0-5 Units Subcutaneous QHS   insulin  aspart  0-6 Units Subcutaneous TID WC   insulin  glargine-yfgn  10 Units Subcutaneous Daily   levETIRAcetam   750 mg Oral BID   pantoprazole   40 mg Oral Daily   polyethylene  glycol  17 g Oral BID   simvastatin   40 mg Oral q1800    PRN meds: acetaminophen , ALPRAZolam , docusate sodium , hydrALAZINE , HYDROmorphone  (DILAUDID ) injection, melatonin, mouth rinse, prochlorperazine    Infusions:    Antimicrobials: Anti-infectives (From admission, onward)    Start     Dose/Rate Route Frequency Ordered Stop   05/01/24 2200  cefTRIAXone  (ROCEPHIN ) 1 g in sodium chloride  0.9 % 100 mL IVPB        1 g 200 mL/hr over 30 Minutes Intravenous Every 24 hours 05/01/24 0439 05/06/24 0751   04/30/24 2300  cefTRIAXone  (ROCEPHIN ) 1 g in sodium chloride  0.9 % 100 mL IVPB        1 g 200 mL/hr over 30 Minutes Intravenous  Once 04/30/24 2255 04/30/24 2329       Objective: Vitals:   05/10/24 1947 05/11/24 0448  BP: 133/69 (!) 153/77  Pulse: (!) 58 (!) 52  Resp: 16 16  Temp: 98.1 F (36.7 C) (!) 97.3 F (36.3 C)  SpO2: 99% 100%    Intake/Output Summary (Last 24 hours) at  05/11/2024 1326 Last data filed at 05/11/2024 0446 Gross per 24 hour  Intake --  Output 875 ml  Net -875 ml   Filed Weights   04/30/24 2001 04/30/24 2015  Weight: 89.6 kg 89.6 kg   Weight change:  Body mass index is 33.38 kg/m.   Physical Exam: General exam: Pleasant, elderly Caucasian female. Skin: No rashes, lesions or ulcers. HEENT: Atraumatic, normocephalic, no obvious bleeding Lungs: Clear to auscultation bilaterally,  CVS: S1, S2, no murmur,   GI/Abd: Soft, nontender, nondistended, bowel sound present,   CNS: Alert, awake, oriented x 3 Psychiatry: Sad affect Extremities: No pedal edema, no calf tenderness,   Data Review: I have personally reviewed the laboratory data and studies available.  F/u labs ordered Unresulted Labs (From admission, onward)     Start     Ordered   Unscheduled  CBC with Differential/Platelet  Tomorrow morning,   R       Question:  Specimen collection method  Answer:  Lab=Lab collect   05/11/24 1326   Unscheduled  Basic metabolic panel with GFR  Tomorrow  morning,   R       Question:  Specimen collection method  Answer:  Lab=Lab collect   05/11/24 1326            Signed, Chapman Rota, MD [facility] 05/11/2024  "

## 2024-05-11 NOTE — Plan of Care (Signed)
  Problem: Coping: Goal: Ability to adjust to condition or change in health will improve Outcome: Progressing   Problem: Fluid Volume: Goal: Ability to maintain a balanced intake and output will improve Outcome: Progressing   Problem: Health Behavior/Discharge Planning: Goal: Ability to manage health-related needs will improve Outcome: Progressing   Problem: Nutritional: Goal: Maintenance of adequate nutrition will improve Outcome: Progressing   Problem: Education: Goal: Knowledge of General Education information will improve Description: Including pain rating scale, medication(s)/side effects and non-pharmacologic comfort measures Outcome: Progressing

## 2024-05-11 NOTE — TOC Progression Note (Signed)
 Transition of Care [facility]) - Progression Note    Patient Details  Name: Sonya Solomon MRN: [id number] Date of Birth: 03-Feb-1946  [facility] CM/SW Contact  Heather DELENA Saltness, LCSW Phone Number: 05/11/2024, 1:35 PM  Clinical Narrative:    Pt recommended for SNF rehab by PT. Pt currently not medically stable. Palliative care and oncology team following, awaiting recommendations.   Expected Discharge Plan:  (TBD) Barriers to Discharge: Continued Medical Work up  Expected Discharge Plan and Services In-house Referral: NA Discharge Planning Services: CM Consult Post Acute Care Choice: Durable Medical Equipment Living arrangements for the past 2 months: Single Family Home  Social Drivers of Health (SDOH) Interventions SDOH Screenings   Food Insecurity: No Food Insecurity (05/01/2024)  Housing: Low Risk (05/01/2024)  Transportation Needs: No Transportation Needs (05/01/2024)  Utilities: Not At Risk (05/01/2024)  Social Connections: Moderately Integrated (05/01/2024)  Tobacco Use: Low Risk (05/09/2024)    Readmission Risk Interventions    05/03/2024    4:12 PM  Readmission Risk Prevention Plan  Transportation Screening Complete  PCP or Specialist Appt within 5-7 Days Complete  Home Care Screening Complete  Medication Review (RN CM) Complete    Signed: Heather Saltness, MSW, LCSW Clinical Social Worker Inpatient Care Management 05/11/2024 1:39 PM

## 2024-05-11 NOTE — Plan of Care (Signed)

## 2024-05-12 ENCOUNTER — Other Ambulatory Visit: Payer: Self-pay | Admitting: Radiation Therapy

## 2024-05-12 ENCOUNTER — Ambulatory Visit: Attending: Radiation Oncology | Admitting: Radiation Oncology

## 2024-05-12 ENCOUNTER — Telehealth: Payer: Self-pay | Admitting: Radiation Therapy

## 2024-05-12 DIAGNOSIS — Z7189 Other specified counseling: Secondary | ICD-10-CM

## 2024-05-12 DIAGNOSIS — W19XXXA Unspecified fall, initial encounter: Secondary | ICD-10-CM

## 2024-05-12 DIAGNOSIS — R638 Other symptoms and signs concerning food and fluid intake: Secondary | ICD-10-CM

## 2024-05-12 DIAGNOSIS — R911 Solitary pulmonary nodule: Secondary | ICD-10-CM | POA: Diagnosis not present

## 2024-05-12 DIAGNOSIS — Z515 Encounter for palliative care: Secondary | ICD-10-CM

## 2024-05-12 DIAGNOSIS — C3490 Malignant neoplasm of unspecified part of unspecified bronchus or lung: Secondary | ICD-10-CM

## 2024-05-12 DIAGNOSIS — N179 Acute kidney failure, unspecified: Secondary | ICD-10-CM | POA: Diagnosis not present

## 2024-05-12 LAB — GLUCOSE, CAPILLARY
Glucose-Capillary: 201 mg/dL — ABNORMAL HIGH (ref 70–99)
Glucose-Capillary: 186 mg/dL — ABNORMAL HIGH (ref 70–99)
Glucose-Capillary: 144 mg/dL — ABNORMAL HIGH (ref 70–99)
Glucose-Capillary: 147 mg/dL — ABNORMAL HIGH (ref 70–99)

## 2024-05-12 LAB — BASIC METABOLIC PANEL WITH GFR
Anion gap: 9 (ref 5–15)
BUN: 31 mg/dL — ABNORMAL HIGH (ref 8–23)
CO2: 24 mmol/L (ref 22–32)
Calcium: 8.9 mg/dL (ref 8.9–10.3)
Chloride: 103 mmol/L (ref 98–111)
Creatinine, Ser: 0.99 mg/dL (ref 0.44–1.00)
GFR, Estimated: 59 mL/min — ABNORMAL LOW
Glucose, Bld: 225 mg/dL — ABNORMAL HIGH (ref 70–99)
Potassium: 5.8 mmol/L — ABNORMAL HIGH (ref 3.5–5.1)
Sodium: 136 mmol/L (ref 135–145)

## 2024-05-12 LAB — CBC WITH DIFFERENTIAL/PLATELET
Abs Immature Granulocytes: 0.18 K/uL — ABNORMAL HIGH (ref 0.00–0.07)
Basophils Absolute: 0 K/uL (ref 0.0–0.1)
Basophils Relative: 0 %
Eosinophils Absolute: 0 K/uL (ref 0.0–0.5)
Eosinophils Relative: 0 %
HCT: 35.3 % — ABNORMAL LOW (ref 36.0–46.0)
Hemoglobin: 11.7 g/dL — ABNORMAL LOW (ref 12.0–15.0)
Immature Granulocytes: 2 %
Lymphocytes Relative: 8 %
Lymphs Abs: 1 K/uL (ref 0.7–4.0)
MCH: 30.7 pg (ref 26.0–34.0)
MCHC: 33.1 g/dL (ref 30.0–36.0)
MCV: 92.7 fL (ref 80.0–100.0)
Monocytes Absolute: 0.6 K/uL (ref 0.1–1.0)
Monocytes Relative: 5 %
Neutro Abs: 10.3 K/uL — ABNORMAL HIGH (ref 1.7–7.7)
Neutrophils Relative %: 85 %
Platelets: 235 K/uL (ref 150–400)
RBC: 3.81 MIL/uL — ABNORMAL LOW (ref 3.87–5.11)
RDW: 15.7 % — ABNORMAL HIGH (ref 11.5–15.5)
WBC: 12 K/uL — ABNORMAL HIGH (ref 4.0–10.5)
nRBC: 0 % (ref 0.0–0.2)

## 2024-05-12 MED ORDER — INSULIN ASPART 100 UNIT/ML IJ SOLN
3.0000 [IU] | Freq: Three times a day (TID) | INTRAMUSCULAR | Status: DC
  Administered 2024-05-12 – 2024-05-16 (×11): 3 [IU] via SUBCUTANEOUS
  Filled 2024-05-12 (×9): qty 3

## 2024-05-12 MED ORDER — LORAZEPAM 2 MG/ML IJ SOLN: 1.0000 mg | Freq: Once | INTRAMUSCULAR | Status: DC

## 2024-05-12 MED ORDER — SODIUM ZIRCONIUM CYCLOSILICATE 10 G PO PACK
10.0000 g | PACK | Freq: Once | ORAL | Status: AC
  Administered 2024-05-12: 10 g via ORAL
  Filled 2024-05-12: qty 1

## 2024-05-12 MED ORDER — INSULIN GLARGINE-YFGN 100 UNIT/ML ~~LOC~~ SOLN
12.0000 [IU] | Freq: Every day | SUBCUTANEOUS | Status: DC
  Administered 2024-05-13 – 2024-05-16 (×4): 12 [IU] via SUBCUTANEOUS
  Filled 2024-05-12 (×5): qty 0.12

## 2024-05-12 MED ORDER — LORAZEPAM 2 MG/ML IJ SOLN
1.0000 mg | Freq: Four times a day (QID) | INTRAMUSCULAR | Status: DC | PRN
  Administered 2024-05-13 – 2024-05-23 (×4): 1 mg via INTRAVENOUS
  Filled 2024-05-12 (×4): qty 1

## 2024-05-12 NOTE — Consult Note (Signed)
 "                              Consultation Note Date: 05/12/2024   Patient Name: Sonya Solomon  DOB: 1946-03-13  MRN: [id number]  Age / Sex: 79 y.o., female  PCP: Leonel Cole, MD Referring Physician: Arlice Reichert, MD  Reason for Consultation: Establishing goals of care  HPI/Patient Profile: 79 y.o. female  with past medical history of MS, type 2 diabetes, hyperlipidemia, hypertension, obesity,, spasticity, insomnia, anxiety and depression, seizure disorder admitted on 04/30/2024 with weakness, poor oral intake, dizziness, and fall.   Patient was initially admitted for AKI on CKD 3 due to poor intake and diarrhea, having hypotension and metabolic acidosis as well.  AKI attributed to E. coli UTI.  During hospitalization, patient was worked up for diplopia.  She was found to have dozens of enhancing brain lesions on imaging concerning for metastases.  Further imaging obtained showed right middle lobe lung nodule, left adrenal nodule, neck thyroid  nodule.  Biopsy of lung nodule obtained with bronchoscopy on 1/6.  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  She was initially planned for simulation in anticipation of whole brain radiation on 1/8 however she was confused and refused treatment.  PMT has been consulted to assist with goals of care conversation in light of significant weakness diagnosed lung cancer with multiple metastasis.  Clinical Assessment and Goals of Care:  I have reviewed medical records including EPIC notes, labs and imaging:  MRI brain 1/3, CT chest 1/4, CT abdomen and pelvis 12/28, with results as stated above.  In addition, found to have acute pancolitis on abdominal CT.  Labs show albumin  of 3.2, ALT is 62, WBC of 12, overall stable.  Discussed with primary MD, radiation oncologist, RN, assessed the patient and then met at the bedside to discuss diagnosis prognosis, GOC, EOL wishes, disposition and options.  I introduced Palliative Medicine as  specialized medical care for people living with serious illness. It focuses on providing relief from the symptoms and stress of a serious illness. The goal is to improve quality of life for both the patient and the family.  We discussed a brief life review of the patient and then focused on their current illness.   I attempted to elicit values and goals of care important to the patient.    Medical History Review and Understanding:  Patient is able to express to me that she has a newly diagnosed cancer in addition to her MS as it is all very overwhelming for her.  She remembers speaking to her doctor about this history and feels that communication regarding her treatment plan is going well.  Social History: Patient is married.  They live), together.  They do not have any children.  She is not religious.  She does that her husband does not drive and is likely taking it over to come to the hospital later this afternoon.  She has trouble finding ways to describe the things she enjoys doing in her spare time.  Functional and Nutritional State: Marked weakness compared to her baseline.  Eating well yesterday and today.  Palliative Symptoms: Patient reports chronic back pain  Advance Directives: A detailed discussion regarding advanced directives was had.  No documentation is currently on file.  Patient did not want to talk about this today given expected treatment and meetings later on in the afternoon.  Discussion: Patient expresses that she  has heard of palliative care in the past, hearing and mentioned several times by several people.  She is appreciative of support today, although she is eating breakfast.  Described the role of PMT, however she does not want to have a detailed goals of care conversation until after she speaks with her medical team with her husband again later this afternoon.  After last night's discussion with oncology, she tells me that she received good advice and will not be  pressured into anything.  Reassurance and emotional support was provided, expressing the hope is that we can ensure that the overall care plan is in line with her specific wishes and care preferences, what ever that might be.  She has a hard time describing her quality of life at baseline.  She notes that it has been very overwhelming to have a new cancer diagnosis. Offered to speak with her husband to provide support.  He feels strongly that this would hurt and make things harder rather than help.  He is very overwhelmed as well and already trying to coordinate his visit in person.  He would be agreeable to PMT recheck again tomorrow after some of the other steps in her care can be completed.  She plans to read hard choices booklet as she is able.  Does not seem she is considered anticipatory care needs or advance care planning in the past.   Discussed the importance of continued conversation with family and the medical providers regarding overall plan of care and treatment options, ensuring decisions are within the context of the patients values and GOCs.   Questions and concerns were addressed.  Hard Choices booklet left for review. The family was encouraged to call with questions or concerns.  PMT will continue to support holistically.   SUMMARY OF RECOMMENDATIONS   - Patient prefers to revisit goals of care conversation tomorrow - Patient declines call to patient's husband today due to concern that he is already overwhelmed enough with coordinating of transportation to the hospital and being here for her in person - Psychosocial and emotional support provided - PMT to continue following and support.  I have asked my colleague Stephane Palin, NP to check in tomorrow   Prognosis:  Poor long-term prognosis with incurable metastatic lung cancer  Discharge Planning: To Be Determined      Primary Diagnoses: Present on Admission:  AKI (acute kidney injury)  MS (multiple sclerosis)   Hyperlipidemia associated with type 2 diabetes mellitus (HCC)  Pulmonary nodule 1 cm or greater in diameter   Physical Exam Vitals and nursing note reviewed.  Constitutional:      General: She is not in acute distress. HENT:     Head: Normocephalic and atraumatic.  Cardiovascular:     Rate and Rhythm: Normal rate.  Pulmonary:     Effort: Pulmonary effort is normal. No respiratory distress.  Neurological:     Mental Status: She is alert.  Psychiatric:        Mood and Affect: Mood normal.        Behavior: Behavior normal.    Vital Signs: BP (!) 157/81 (BP Location: Left Arm)   Pulse 61   Temp (!) 97.5 F (36.4 C) (Oral)   Resp 20   Ht 5' 4.5 (1.638 m)   Wt 89.6 kg   SpO2 100%   BMI 33.38 kg/m  Pain Scale: 0-10   Pain Score: Asleep   SpO2: SpO2: 100 % O2 Device:SpO2: 100 % O2 Flow Rate: .O2 Flow  Rate (L/min): 2 L/min    Hillis Mcphatter SHAUNNA Fell, PA-C  Palliative Medicine Team Team phone # [phone]  Thank you for allowing the Palliative Medicine Team to assist in the care of this patient. Please utilize secure chat with additional questions, if there is no response within 30 minutes please call the above phone number.  Palliative Medicine Team providers are available by phone from 7am to 7pm daily and can be reached through the team cell phone.  Should this patient require assistance outside of these hours, please call the patient's attending physician.   I personally spent a total of 55 minutes in the care of the patient today including preparing to see the patient, getting/reviewing separately obtained history, performing a medically appropriate exam/evaluation, counseling and educating, referring and communicating with other health care professionals, documenting clinical information in the EHR, and coordinating care.   "

## 2024-05-12 NOTE — Progress Notes (Signed)
 " PROGRESS NOTE  Sonya Solomon  DOB: [birth year]  PCP: Leonel Cole, MD FMW:[id number]  DOA: 04/30/2024  LOS: 12 days  Hospital Day: 13  Subjective: Patient was seen and examined this morning. Lying in bed.  Open eyes on command. Not interested in getting out any conversation today. Family not at bedside Afebrile, heart rate in 50s and 60s, blood pressure 150s this morning on room air on 2 L oxygen Blood glucose, remains over 200  Brief narrative: Sonya Solomon is a 79 y.o. female with PMH significant for DM2, HTN, HLD, CKD 3A, obesity, MS, gait disturbance, vertigo, urinary urgency, spasticity, insomnia Lives at home with her husband. At baseline, walks with a walker and cane and independent with ADLs  12/28, patient presented to ED after a fall, diarrhea She was found to be very dehydrated with creatinine significantly elevated to 5.4 CT abdomen pelvis showed pancolitis Admitted to [facility] course was complicated by new diagnosis of brain mets from lung primary.  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  CT head was obtained which showed multiple intracranial lesions suggestive of metastatic disease with vasogenic edema, local mass effect Follow-up MRI brain was obtained which confirmed multiple enhancing nodules within the cerebral cerebellar hemisphere bilaterally suspicious for metastatic disease. 1/4 CT chest showed 18 x 14 mm irregular nodular density in the right middle lobe.  Seen by pulmonary 1/6, underwent flexible video fiberoptic bronchoscopy with robotic assistance and biopsies.  Biopsy report showed adenocarcinoma of lung. Medical oncology and radiation oncology involved. Palliative care consulted as well Currently on Keppra  750 mg twice daily and Decadron  4 mg every 8 hours,  Acute pancolitis Initially presented with diarrhea dehydration CT scan on admission showed  pancolitis Diarrhea seems to have improved If constipated, to use MiraLAX  and senna and Dulcolax   Hypotension H/o hypertension Hypotension resolved with fluids, midodrine  and Solu-Cortef  Lisinopril  on hold Currently also on DDAVP  0.1 mg nightly which is her home dose.  AKI on CKD 3A Hypomagnesemia Hyperkalemia Baseline creatinine 1.1.  Presented with creatinine elevated to 5.4 due to diarrhea dehydration With IV hydration, creatinine significant improved now.  Renal ultrasound without any obstruction. Magnesium  level improved with replacement. Potassium level continues to rise up to 5.8 today.  Unclear etiology.  Ordered for 1 dose of Lokelma  today. continue to monitor. Recent Labs  Lab 05/06/24 1011 05/07/24 1820 05/08/24 0527 05/11/24 0651 05/12/24 0619  NA 139 139 139 137 136  K 4.6 4.4 4.5 5.2* 5.8*  CL 110 109 110 104 103  CO2 17* 19* 20* 26 24  GLUCOSE 265* 209* 227* 234* 225*  BUN 53* 42* 38* 32* 31*  CREATININE 1.56* 1.32* 1.16* 1.06* 0.99  CALCIUM 9.0 9.0 8.8* 8.9 8.9  MG 2.0  --   --   --   --    E. coli UTI, POA  completed treatment with Rocephin .   Acute urinary retention Foley catheter was inserted in the ED.  Patient feels very weak and wants to continue Foley for now.  I have discussed with her the risk of long-term Foley dependence.  Will offer voiding trial again once her mobility improves.      Demand ischemia with NSVT SVT- Troponin trend relatively flat 126, 141, 169 Echocardiogram negative for regional wall motion abnormality, EF 70 to 75%, grade 1 diastolic dysfunction, elevated pulmonary artery systolic pressure, LV hypertrophy severe?     Cardiology recommend outpatient follow-up  Seizure disorder Continue Keppra     Multiple sclerosis followed by Dr. Vear neurology as an outpatient.   Seen by PT OT recommending SNF.   CIR saw the patient did not think she is a candidate for CIR. She seems to be much weaker currently than her baseline 3  weeks ago.  Needs to encourage ambulation.  Type 2 diabetes mellitus  hyperglycemia due to steroids A1c controlled at 7 on 04/30/2024 PTA meds-metformin , Actos , Ozempic  Blood sugar level running elevated due to steroids Currently on Lantus  10 units daily.  Blood sugar level running elevated consistently over 200.  Add Premeal scheduled aspart 3 units 3 times daily for.  Increase Lantus  to 12 units for tomorrow.  Continue SSI/Accu-Cheks Recent Labs  Lab 05/11/24 1153 05/11/24 1704 05/11/24 2113 05/12/24 0739 05/12/24 1154  GLUCAP 233* 209* 175* 201* 186*   Hyperlipidemia  continue Zocor   Anxiety/depression Xanax  0.25 mg 3 times daily as needed    Nutrition Status:         Mobility:   PT Orders:   PT Follow up Rec: Skilled Nursing-Short Term Rehab (<3 Hours/Day)05/05/2024 1211    Goals of care   Code Status: Full Code  Palliative care consulted    DVT prophylaxis:  Place and maintain sequential compression device Start: 05/10/24 1520   Antimicrobials: None currently Fluid: None Consultants: Oncology, palliative care Family Communication: None at bedside.  I spoke with patient's husband yesterday 1/8.  Status: Inpatient Level of care:  Med-Surg   Patient is from: Home Needs to continue in-hospital care: Cancer care started. Anticipated d/c to: SNF recommended by PT   Diet:  Diet Order             Diet Carb Modified Room service appropriate? Yes  Diet effective now                   Scheduled Meds:  Chlorhexidine  Gluconate Cloth  6 each Topical Daily   desmopressin   0.1 mg Oral QHS   dexamethasone   4 mg Oral Q8H   feeding supplement  237 mL Oral BID BM   insulin  aspart  0-5 Units Subcutaneous QHS   insulin  aspart  0-6 Units Subcutaneous TID WC   insulin  aspart  3 Units Subcutaneous TID WC   [START ON 05/13/2024] insulin  glargine-yfgn  12 Units Subcutaneous Daily   levETIRAcetam   750 mg Oral BID   pantoprazole   40 mg Oral Daily    polyethylene glycol  17 g Oral BID   simvastatin   40 mg Oral q1800    PRN meds: acetaminophen , ALPRAZolam , docusate sodium , hydrALAZINE , HYDROmorphone  (DILAUDID ) injection, melatonin, mouth rinse, prochlorperazine    Infusions:    Antimicrobials: Anti-infectives (From admission, onward)    Start     Dose/Rate Route Frequency Ordered Stop   05/01/24 2200  cefTRIAXone  (ROCEPHIN ) 1 g in sodium chloride  0.9 % 100 mL IVPB        1 g 200 mL/hr over 30 Minutes Intravenous Every 24 hours 05/01/24 0439 05/06/24 0751   04/30/24 2300  cefTRIAXone  (ROCEPHIN ) 1 g in sodium chloride  0.9 % 100 mL IVPB        1 g 200 mL/hr over 30 Minutes Intravenous  Once 04/30/24 2255 04/30/24 2329       Objective: Vitals:   05/11/24 1931 05/12/24 0535  BP: 127/71 (!) 157/81  Pulse: (!) 58 61  Resp: 16 20  Temp: 97.7 F (36.5 C) (!) 97.5 F (36.4 C)  SpO2: 98% 100%    Intake/Output  Summary (Last 24 hours) at 05/12/2024 1400 Last data filed at 05/12/2024 0900 Gross per 24 hour  Intake 120 ml  Output 300 ml  Net -180 ml   Filed Weights   04/30/24 2001 04/30/24 2015  Weight: 89.6 kg 89.6 kg   Weight change:  Body mass index is 33.38 kg/m.   Physical Exam: General exam: Pleasant, elderly Caucasian female.  Not in distress. Skin: No rashes, lesions or ulcers. HEENT: Atraumatic, normocephalic, no obvious bleeding Lungs: Clear to auscultation bilaterally,  CVS: S1, S2, no murmur,   GI/Abd: Soft, nontender, nondistended, bowel sound present,   CNS: Alert, awake, oriented x 3 Psychiatry: Very sad affect.  Seems overwhelmed with the new diagnosis of metastatic cancer Extremities: No pedal edema, no calf tenderness,   Data Review: I have personally reviewed the laboratory data and studies available.  F/u labs ordered Unresulted Labs (From admission, onward)     Start     Ordered   05/13/24 0500  Basic metabolic panel with GFR  Tomorrow morning,   R       Question:  Specimen collection method   Answer:  Lab=Lab collect   05/12/24 1400   05/12/24 0500  CBC with Differential/Platelet  Tomorrow morning,   R       Question:  Specimen collection method  Answer:  Lab=Lab collect   05/11/24 1326            Signed, Chapman Rota, MD [facility] 05/12/2024  "

## 2024-05-12 NOTE — Telephone Encounter (Signed)
 Ms. Prak is having some issues with cognition and confusion. When we attempted to bring her down for radiation treatment planning yesterday, she refused to come stating that she did not know what was going on.   I have called to request her husband be with her this afternoon when we attempt to bring her down again. He said that he can be here at 3:00 to help her with understanding what is going on.   Her simulation is scheduled at 3:30 today.   Devere Perch R.T(R)(T) Radiation Special Procedures Lead

## 2024-05-12 NOTE — Progress Notes (Signed)
 I met with the patient and her husband today before radiation planning.  I spoke with the patient's husband as she has been showing more signs of confusion and I got him up to date on her workup, findings, and recommendations for whole brain radiation therapy.  We went over the consent form together and he cosigned it.    Sonya Solomon just mentioned to me that she has lumbar pain and difficulty moving her legs and also has had some difficulty with bowel movements.  She currently has a Foley catheter.  I am concerned that she may have spine metastases plus or minus a cord compression.  I got in touch with Dr. Arlice and he has ordered a STAT Lumbar spine MRI with and without contrast.  We will obtain CT sim images of her lower spine here in rad/onc in case palliative radiation is indicated.  -----------------------------------  Lauraine Golden, MD

## 2024-05-13 ENCOUNTER — Inpatient Hospital Stay (HOSPITAL_COMMUNITY)

## 2024-05-13 DIAGNOSIS — R911 Solitary pulmonary nodule: Secondary | ICD-10-CM | POA: Diagnosis not present

## 2024-05-13 LAB — BASIC METABOLIC PANEL WITH GFR
Anion gap: 12 (ref 5–15)
BUN: 32 mg/dL — ABNORMAL HIGH (ref 8–23)
CO2: 23 mmol/L (ref 22–32)
Calcium: 8.7 mg/dL — ABNORMAL LOW (ref 8.9–10.3)
Chloride: 98 mmol/L (ref 98–111)
Creatinine, Ser: 0.89 mg/dL (ref 0.44–1.00)
GFR, Estimated: >60 mL/min
Glucose, Bld: 216 mg/dL — ABNORMAL HIGH (ref 70–99)
Potassium: 4.5 mmol/L (ref 3.5–5.1)
Sodium: 134 mmol/L — ABNORMAL LOW (ref 135–145)

## 2024-05-13 LAB — GLUCOSE, CAPILLARY
Glucose-Capillary: 163 mg/dL — ABNORMAL HIGH (ref 70–99)
Glucose-Capillary: 185 mg/dL — ABNORMAL HIGH (ref 70–99)
Glucose-Capillary: 164 mg/dL — ABNORMAL HIGH (ref 70–99)
Glucose-Capillary: 145 mg/dL — ABNORMAL HIGH (ref 70–99)

## 2024-05-13 MED ORDER — GADOBUTROL 1 MMOL/ML IV SOLN
9.0000 mL | Freq: Once | INTRAVENOUS | Status: AC | PRN
  Administered 2024-05-13: 9 mL via INTRAVENOUS

## 2024-05-13 MED ORDER — LORAZEPAM 2 MG/ML IJ SOLN
1.0000 mg | Freq: Once | INTRAMUSCULAR | Status: AC
  Administered 2024-05-13: 1 mg via INTRAVENOUS
  Filled 2024-05-13: qty 1

## 2024-05-13 NOTE — Progress Notes (Signed)
 1 mg Ativan  IV given at 1:34 pm prior to MRI. Per MD okay to give another dose while on MRI table. RN accompanied pt to MRI. Pt tolerated most of MRI but pt becoming increasingly anxious midway. Another 1 mg of Ativan  IV given at 3:37 PM in order to complete imaging.

## 2024-05-13 NOTE — Progress Notes (Signed)
 " PROGRESS NOTE  Sonya Solomon  DOB: 06/30/1945  PCP: Leonel Cole, MD FMW:[id number]  DOA: 04/30/2024  LOS: 13 days  Hospital Day: 14  Subjective: Patient was seen and examined this morning. Lying on bed.  Not in distress.  Wakes up on command.   Family not at bedside. Pending MRI today Afebrile, hemodynamically stable, breathing on room air. Blood sugar level less than 200 consistently  Brief narrative: Sonya Solomon is a 79 y.o. female with PMH significant for DM2, HTN, HLD, CKD 3A, obesity, MS, gait disturbance, vertigo, urinary urgency, spasticity, insomnia Lives at home with her husband. At baseline, walks with a walker and cane and independent with ADLs  12/28, patient presented to ED after a fall, diarrhea She was found to be very dehydrated with creatinine significantly elevated to 5.4 CT abdomen pelvis showed pancolitis Admitted to [facility] course was complicated by new diagnosis of brain mets from lung primary.  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  CT head was obtained which showed multiple intracranial lesions suggestive of metastatic disease with vasogenic edema, local mass effect Follow-up MRI brain was obtained which confirmed multiple enhancing nodules within the cerebral cerebellar hemisphere bilaterally suspicious for metastatic disease. 1/4 CT chest showed 18 x 14 mm irregular nodular density in the right middle lobe.  Seen by pulmonary 1/6, underwent flexible video fiberoptic bronchoscopy with robotic assistance and biopsies.  Biopsy report showed adenocarcinoma of lung. Medical oncology and radiation oncology involved. Pending MRI brain and MRI total spine to look for more mets.  Noted to plan of radiation Palliative care consulted as well Currently on Keppra  750 mg twice daily and Decadron  4 mg every 8 hours,  Acute pancolitis Initially presented with diarrhea  dehydration CT scan on admission showed pancolitis Diarrhea seems to have improved If constipated, to use MiraLAX  and senna and Dulcolax   Hypotension H/o hypertension Hypotension resolved with fluids, midodrine  and Solu-Cortef  Lisinopril  on hold Currently also on DDAVP  0.1 mg nightly which is her home dose.  AKI on CKD 3A Hypomagnesemia Hyperkalemia Baseline creatinine 1.1.  Presented with creatinine elevated to 5.4 due to diarrhea dehydration With IV hydration, creatinine significant improved now.  Renal ultrasound without any obstruction. Magnesium  level improved with replacement. Potassium level elevated as high as 5.8 on 1/9.  1 dose of Lokelma  given.  Repeat labs today showed potassium improved to 4.5 continue to monitor. Recent Labs  Lab 05/07/24 1820 05/08/24 0527 05/11/24 0651 05/12/24 0619 05/13/24 1014  NA 139 139 137 136 134*  K 4.4 4.5 5.2* 5.8* 4.5  CL 109 110 104 103 98  CO2 19* 20* 26 24 23   GLUCOSE 209* 227* 234* 225* 216*  BUN 42* 38* 32* 31* 32*  CREATININE 1.32* 1.16* 1.06* 0.99 0.89  CALCIUM 9.0 8.8* 8.9 8.9 8.7*   E. coli UTI, POA  completed treatment with Rocephin .   Acute urinary retention Foley catheter was inserted in the ED.  Patient feels very weak and wants to continue Foley for now.  I have discussed with her the risk of long-term Foley dependence.  Will offer voiding trial again once her mobility improves.      Demand ischemia with NSVT SVT- Troponin trend relatively flat 126, 141, 169 Echocardiogram negative for regional wall motion abnormality, EF 70 to 75%, grade 1 diastolic dysfunction, elevated pulmonary artery systolic pressure, LV hypertrophy severe?     Cardiology recommend outpatient follow-up  Seizure  disorder Continue Keppra     Multiple sclerosis followed by Dr. Vear neurology as an outpatient.   Seen by PT OT recommending SNF.   CIR saw the patient did not think she is a candidate for CIR. She seems to be much weaker  currently than her baseline 3 weeks ago.  Needs to encourage ambulation.  Type 2 diabetes mellitus  hyperglycemia due to steroids A1c controlled at 7 on 04/30/2024 PTA meds-metformin , Actos , Ozempic  Currently on Lantus  12 units daily, scheduled Premeal aspart 3 units 3 times daily, SSI with Accu-Cheks.   Recent Labs  Lab 05/12/24 1154 05/12/24 1639 05/12/24 2210 05/13/24 0741 05/13/24 1151  GLUCAP 186* 144* 147* 163* 185*   Hyperlipidemia  continue Zocor   Anxiety/depression Xanax  0.25 mg 3 times daily as needed    Nutrition Status:         Mobility:   PT Orders:   PT Follow up Rec: Skilled Nursing-Short Term Rehab (<3 Hours/Day)05/05/2024 1211    Goals of care   Code Status: Full Code  Palliative care consulted    DVT prophylaxis:  Place and maintain sequential compression device Start: 05/10/24 1520   Antimicrobials: None currently Fluid: None Consultants: Oncology, palliative care Family Communication: None at bedside.    Status: Inpatient Level of care:  Med-Surg   Patient is from: Home Needs to continue in-hospital care: Cancer care started.  Pending MRI today Anticipated d/c to: SNF recommended by PT   Diet:  Diet Order             Diet Carb Modified Room service appropriate? Yes  Diet effective now                   Scheduled Meds:  Chlorhexidine  Gluconate Cloth  6 each Topical Daily   desmopressin   0.1 mg Oral QHS   dexamethasone   4 mg Oral Q8H   feeding supplement  237 mL Oral BID BM   insulin  aspart  0-5 Units Subcutaneous QHS   insulin  aspart  0-6 Units Subcutaneous TID WC   insulin  aspart  3 Units Subcutaneous TID WC   insulin  glargine-yfgn  12 Units Subcutaneous Daily   levETIRAcetam   750 mg Oral BID   Sonya Solomon   1 mg Intravenous Once   pantoprazole   40 mg Oral Daily   polyethylene glycol  17 g Oral BID   simvastatin   40 mg Oral q1800    PRN meds: acetaminophen , ALPRAZolam , docusate sodium , hydrALAZINE , HYDROmorphone   (DILAUDID ) injection, Sonya Solomon , melatonin, mouth rinse, prochlorperazine    Infusions:    Antimicrobials: Anti-infectives (From admission, onward)    Start     Dose/Rate Route Frequency Ordered Stop   05/01/24 2200  cefTRIAXone  (ROCEPHIN ) 1 g in sodium chloride  0.9 % 100 mL IVPB        1 g 200 mL/hr over 30 Minutes Intravenous Every 24 hours 05/01/24 0439 05/06/24 0751   04/30/24 2300  cefTRIAXone  (ROCEPHIN ) 1 g in sodium chloride  0.9 % 100 mL IVPB        1 g 200 mL/hr over 30 Minutes Intravenous  Once 04/30/24 2255 04/30/24 2329       Objective: Vitals:   05/13/24 0500 05/13/24 1154  BP: 138/66 129/69  Pulse: (!) 56 (!) 50  Resp:  18  Temp: 98.2 F (36.8 C) 97.6 F (36.4 C)  SpO2: 99% 99%    Intake/Output Summary (Last 24 hours) at 05/13/2024 1334 Last data filed at 05/13/2024 0848 Gross per 24 hour  Intake 480 ml  Output 1700  ml  Net -1220 ml   Filed Weights   04/30/24 2001 04/30/24 2015  Weight: 89.6 kg 89.6 kg   Weight change:  Body mass index is 33.38 kg/m.   Physical Exam: General exam: Pleasant, elderly Caucasian female.  Not in distress. Skin: No rashes, lesions or ulcers. HEENT: Atraumatic, normocephalic, no obvious bleeding Lungs: Clear to auscultation bilaterally,  CVS: S1, S2, no murmur,   GI/Abd: Soft, nontender, nondistended, bowel sound present,   CNS: Opens eyes to command.  Able to answer orientation questions.  Slow to respond Psychiatry: sad affect.  Seems overwhelmed with the new diagnosis of metastatic cancer. Extremities: No pedal edema, no calf tenderness,   Data Review: I have personally reviewed the laboratory data and studies available.  F/u labs ordered Unresulted Labs (From admission, onward)     Start     Ordered   05/14/24 0500  Basic metabolic panel with GFR  Tomorrow morning,   R       Question:  Specimen collection method  Answer:  Lab=Lab collect   05/13/24 0909   05/14/24 0500  CBC with Differential/Platelet  Tomorrow  morning,   R       Question:  Specimen collection method  Answer:  Lab=Lab collect   05/13/24 0909   05/12/24 0500  CBC with Differential/Platelet  Tomorrow morning,   R       Question:  Specimen collection method  Answer:  Lab=Lab collect   05/11/24 1326            Signed, Chapman Rota, MD [facility] 05/13/2024  "

## 2024-05-13 NOTE — Plan of Care (Signed)
  Problem: Nutritional: Goal: Maintenance of adequate nutrition will improve Outcome: Progressing   Problem: Nutritional: Goal: Progress toward achieving an optimal weight will improve Outcome: Progressing   Problem: Skin Integrity: Goal: Risk for impaired skin integrity will decrease Outcome: Progressing   Problem: Tissue Perfusion: Goal: Adequacy of tissue perfusion will improve Outcome: Progressing

## 2024-05-14 DIAGNOSIS — R911 Solitary pulmonary nodule: Secondary | ICD-10-CM | POA: Diagnosis not present

## 2024-05-14 LAB — CBC WITH DIFFERENTIAL/PLATELET
Abs Immature Granulocytes: 0.13 K/uL — ABNORMAL HIGH (ref 0.00–0.07)
Basophils Absolute: 0 K/uL (ref 0.0–0.1)
Basophils Relative: 0 %
Eosinophils Absolute: 0 K/uL (ref 0.0–0.5)
Eosinophils Relative: 0 %
HCT: 34.6 % — ABNORMAL LOW (ref 36.0–46.0)
Hemoglobin: 11.5 g/dL — ABNORMAL LOW (ref 12.0–15.0)
Immature Granulocytes: 1 %
Lymphocytes Relative: 8 %
Lymphs Abs: 1.1 K/uL (ref 0.7–4.0)
MCH: 30.6 pg (ref 26.0–34.0)
MCHC: 33.2 g/dL (ref 30.0–36.0)
MCV: 92 fL (ref 80.0–100.0)
Monocytes Absolute: 0.8 K/uL (ref 0.1–1.0)
Monocytes Relative: 6 %
Neutro Abs: 11.6 K/uL — ABNORMAL HIGH (ref 1.7–7.7)
Neutrophils Relative %: 85 %
Platelets: 217 K/uL (ref 150–400)
RBC: 3.76 MIL/uL — ABNORMAL LOW (ref 3.87–5.11)
RDW: 15.4 % (ref 11.5–15.5)
WBC: 13.6 K/uL — ABNORMAL HIGH (ref 4.0–10.5)
nRBC: 0 % (ref 0.0–0.2)

## 2024-05-14 LAB — BASIC METABOLIC PANEL WITH GFR
Anion gap: 12 (ref 5–15)
BUN: 28 mg/dL — ABNORMAL HIGH (ref 8–23)
CO2: 23 mmol/L (ref 22–32)
Calcium: 8.5 mg/dL — ABNORMAL LOW (ref 8.9–10.3)
Chloride: 100 mmol/L (ref 98–111)
Creatinine, Ser: 0.86 mg/dL (ref 0.44–1.00)
GFR, Estimated: >60 mL/min
Glucose, Bld: 158 mg/dL — ABNORMAL HIGH (ref 70–99)
Potassium: 4.9 mmol/L (ref 3.5–5.1)
Sodium: 135 mmol/L (ref 135–145)

## 2024-05-14 LAB — GLUCOSE, CAPILLARY
Glucose-Capillary: 151 mg/dL — ABNORMAL HIGH (ref 70–99)
Glucose-Capillary: 182 mg/dL — ABNORMAL HIGH (ref 70–99)
Glucose-Capillary: 168 mg/dL — ABNORMAL HIGH (ref 70–99)
Glucose-Capillary: 175 mg/dL — ABNORMAL HIGH (ref 70–99)

## 2024-05-14 MED ORDER — ACETAMINOPHEN 500 MG PO TABS
1000.0000 mg | ORAL_TABLET | Freq: Three times a day (TID) | ORAL | Status: DC
  Administered 2024-05-14 – 2024-05-26 (×37): 1000 mg via ORAL
  Filled 2024-05-14 (×38): qty 2

## 2024-05-14 MED ORDER — OXYCODONE HCL 5 MG PO TABS
5.0000 mg | ORAL_TABLET | Freq: Four times a day (QID) | ORAL | Status: DC | PRN
  Administered 2024-05-14 – 2024-05-25 (×15): 5 mg via ORAL
  Filled 2024-05-14 (×16): qty 1

## 2024-05-14 NOTE — Care Plan (Signed)
" ° ° ° °  Chart reviewed and updates received from attending provider. No PMT needs today due to patient's mood. Best to have continuity of care. PMT will continue to follow.  Thank you for your referral and allowing PMT to assist in Sonya Solomon's care.   Stephane Palin, NP Palliative Medicine Team  Team Phone # [phone]   NO CHARGE  "

## 2024-05-14 NOTE — Progress Notes (Signed)
 " PROGRESS NOTE  BRONWEN Solomon  DOB: 1945-06-07  PCP: Leonel Cole, MD FMW:[id number]  DOA: 04/30/2024  LOS: 14 days  Hospital Day: 15  Subjective: Patient was seen and examined this morning. Lying on bed.  Not in distress no new symptoms. Family not at bedside Afebrile, heart rate in 50s and 60s, blood pressure in normal range, breathing on low-flow oxygen. Labs this morning with WBC count 13.6  Brief narrative: DAEJAH Solomon is a 79 y.o. female with PMH significant for DM2, HTN, HLD, CKD 3A, obesity, MS, gait disturbance, vertigo, urinary urgency, spasticity, insomnia Lives at home with her husband. At baseline, walks with a walker and cane and independent with ADLs  12/28, patient presented to ED after a fall, diarrhea She was found to be very dehydrated with creatinine significantly elevated to 5.4 CT abdomen pelvis showed pancolitis Admitted to [facility] course was complicated by new diagnosis of brain mets from lung primary. 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  CT head was obtained which showed multiple intracranial lesions suggestive of metastatic disease with vasogenic edema, local mass effect Follow-up MRI brain was obtained which confirmed multiple enhancing nodules within the cerebral cerebellar hemisphere bilaterally suspicious for metastatic disease. 1/4 CT chest showed 18 x 14 mm irregular nodular density in the right middle lobe.  Seen by pulmonary 1/6, underwent flexible video fiberoptic bronchoscopy with robotic assistance and biopsies.  Biopsy report showed adenocarcinoma of lung. Medical oncology, radiation oncology and palliative care consulted. 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  AKI on CKD 3A Hyperkalemia Natremia Baseline creatinine 1.1.  Presented with creatinine elevated to 5.4 due to diarrhea dehydration.  With IV hydration, creatinine significant improved now.  Renal ultrasound without any obstruction. Potassium level was elevated as high as 5.8 on 1/9.  Improved to normal with 1 dose of Lokelma   1/11, sodium level running low at 134 today.  Likely from poor oral intake Recent Labs  Lab 05/08/24 0527 05/11/24 0651 05/12/24 0619 05/13/24 1014 05/14/24 0549  NA 139 137 136 134* 135  K 4.5 5.2* 5.8* 4.5 4.9  CL 110 104 103 98 100  CO2 20* 26 24 23 23   GLUCOSE 227* 234* 225* 216* 158*  BUN 38* 32* 31* 32* 28*  CREATININE 1.16* 1.06* 0.99 0.89 0.86  CALCIUM 8.8* 8.9 8.9 8.7* 8.5*   E. coli UTI, POA  completed treatment with Rocephin .   Acute urinary retention Foley catheter was inserted in the ED.  Patient feels very weak and wants to continue Foley for now.  I have discussed with her the risk of long-term Foley dependence.  Will offer voiding trial again once her mobility improves.      Demand ischemia with NSVT SVT- Troponin trend relatively flat  126, 141, 169 Echocardiogram negative for regional wall motion abnormality, EF 70 to 75%, grade 1 diastolic dysfunction, elevated pulmonary artery systolic pressure, LV hypertrophy severe?     Cardiology recommend outpatient follow-up  Seizure disorder Continue Keppra      Multiple sclerosis followed by Dr. Vear neurology as an outpatient.   Seen by PT OT recommending SNF.   CIR saw the patient did not think she is a candidate for CIR. She seems to be much weaker currently than her baseline 3 weeks ago.  Needs to encourage ambulation.  Type 2 diabetes mellitus  hyperglycemia due to steroids A1c controlled at 7 on 04/30/2024 PTA meds-metformin , Actos , Ozempic  Currently on Lantus  12 units daily, scheduled Premeal aspart 3 units 3 times daily, SSI with Accu-Cheks.   Recent Labs  Lab 05/13/24 1151 05/13/24 1649 05/13/24 2119 05/14/24 0711 05/14/24 1106  GLUCAP 185* 164* 145* 151* 182*   Hyperlipidemia  continue Zocor   Anxiety/depression Xanax  0.25 mg 3 times daily as needed    Nutrition Status:         Mobility:   PT Orders:   PT Follow up Rec: Skilled Nursing-Short Term Rehab (<3 Hours/Day)05/05/2024 1211    Goals of care   Code Status: Full Code  Palliative care consulted    DVT prophylaxis:  Place and maintain sequential compression device Start: 05/10/24 1520   Antimicrobials: None currently Fluid: None Consultants: Oncology, palliative care Family Communication: None at bedside.    Status: Inpatient Level of care:  Med-Surg   Patient is from: Home Needs to continue in-hospital care: Cancer care started.  Radiation planned  Anticipated d/c to: SNF recommended by PT   Diet:  Diet Order             Diet Carb Modified Room service appropriate? Yes  Diet effective now                   Scheduled Meds:  acetaminophen   1,000 mg Oral TID   Chlorhexidine  Gluconate Cloth  6 each Topical Daily   desmopressin   0.1 mg Oral QHS   dexamethasone   4 mg Oral Q8H   feeding supplement  237 mL Oral BID BM   insulin  aspart  0-5 Units Subcutaneous QHS   insulin  aspart  0-6 Units Subcutaneous TID WC   insulin  aspart  3 Units Subcutaneous TID WC   insulin  glargine-yfgn  12 Units Subcutaneous Daily   levETIRAcetam   750 mg  Oral BID   pantoprazole   40 mg Oral Daily   polyethylene glycol  17 g Oral BID   simvastatin   40 mg Oral q1800    PRN meds: ALPRAZolam , docusate sodium , hydrALAZINE , HYDROmorphone  (DILAUDID ) injection, LORazepam , melatonin, mouth rinse, oxyCODONE , prochlorperazine    Infusions:    Antimicrobials: Anti-infectives (From admission, onward)    Start     Dose/Rate Route Frequency Ordered Stop   05/01/24 2200  cefTRIAXone  (ROCEPHIN ) 1 g in sodium chloride  0.9 % 100 mL IVPB        1 g 200 mL/hr over 30 Minutes Intravenous Every 24 hours 05/01/24 0439 05/06/24 0751   04/30/24 2300  cefTRIAXone  (ROCEPHIN ) 1 g in sodium chloride  0.9 % 100 mL IVPB        1 g 200 mL/hr over 30 Minutes Intravenous  Once 04/30/24 2255 04/30/24 2329       Objective: Vitals:   05/14/24 0444 05/14/24 1456  BP: (!) 114/51 106/66  Pulse: (!) 55 (!) 55  Resp: 18 16  Temp: 97.9 F (36.6  C) 97.8 F (36.6 C)  SpO2:      Intake/Output Summary (Last 24 hours) at 05/14/2024 1500 Last data filed at 05/14/2024 0555 Gross per 24 hour  Intake 200 ml  Output 1550 ml  Net -1350 ml   Filed Weights   04/30/24 2001 04/30/24 2015  Weight: 89.6 kg 89.6 kg   Weight change:  Body mass index is 33.38 kg/m.   Physical Exam: General exam: Pleasant, elderly Caucasian female.  Not in distress. Skin: No rashes, lesions or ulcers. HEENT: Atraumatic, normocephalic, no obvious bleeding Lungs: Clear to auscultation bilaterally,  CVS: S1, S2, no murmur,   GI/Abd: Soft, nontender, nondistended, bowel sound present,   CNS: Opens eyes to command.  Able to answer orientation questions.  Slow to respond Psychiatry: sad affect.  Seems overwhelmed with the new diagnosis of metastatic cancer. Extremities: No pedal edema, no calf tenderness,   Data Review: I have personally reviewed the laboratory data and studies available.  F/u labs ordered Unresulted Labs (From admission, onward)     Start     Ordered   05/12/24 0500  CBC  with Differential/Platelet  Tomorrow morning,   R       Question:  Specimen collection method  Answer:  Lab=Lab collect   05/11/24 1326            Signed, Chapman Rota, MD [facility] 05/14/2024  "

## 2024-05-14 NOTE — Plan of Care (Signed)
 Confusion continues.  Updated patient on plan of care.  Medicated with oral pain medication, see MAR for details.   Problem: Education: Goal: Ability to describe self-care measures that may prevent or decrease complications (Diabetes Survival Skills Education) will improve Outcome: Progressing Goal: Individualized Educational Video(s) Outcome: Progressing   Problem: Coping: Goal: Ability to adjust to condition or change in health will improve Outcome: Progressing   Problem: Fluid Volume: Goal: Ability to maintain a balanced intake and output will improve Outcome: Progressing

## 2024-05-15 ENCOUNTER — Inpatient Hospital Stay: Attending: Radiation Oncology

## 2024-05-15 ENCOUNTER — Ambulatory Visit

## 2024-05-15 ENCOUNTER — Other Ambulatory Visit: Payer: Self-pay

## 2024-05-15 ENCOUNTER — Ambulatory Visit: Admitting: Radiation Oncology

## 2024-05-15 DIAGNOSIS — R911 Solitary pulmonary nodule: Secondary | ICD-10-CM | POA: Diagnosis not present

## 2024-05-15 LAB — RAD ONC ARIA SESSION SUMMARY
Course Elapsed Days: 0
Plan Fractions Treated to Date: 1
Plan Prescribed Dose Per Fraction: 3 Gy
Plan Total Fractions Prescribed: 10
Plan Total Prescribed Dose: 30 Gy
Reference Point Dosage Given to Date: 3 Gy
Reference Point Session Dosage Given: 3 Gy
Session Number: 1

## 2024-05-15 LAB — GLUCOSE, CAPILLARY
Glucose-Capillary: 221 mg/dL — ABNORMAL HIGH (ref 70–99)
Glucose-Capillary: 270 mg/dL — ABNORMAL HIGH (ref 70–99)
Glucose-Capillary: 163 mg/dL — ABNORMAL HIGH (ref 70–99)
Glucose-Capillary: 180 mg/dL — ABNORMAL HIGH (ref 70–99)
Glucose-Capillary: 234 mg/dL — ABNORMAL HIGH (ref 70–99)
Glucose-Capillary: 130 mg/dL — ABNORMAL HIGH (ref 70–99)
Glucose-Capillary: 117 mg/dL — ABNORMAL HIGH (ref 70–99)

## 2024-05-15 NOTE — Plan of Care (Signed)

## 2024-05-15 NOTE — Plan of Care (Signed)

## 2024-05-15 NOTE — TOC Progression Note (Signed)
 Transition of Care [facility]) - Progression Note    Patient Details  Name: Sonya Solomon MRN: [id number] Date of Birth: 07/23/1945  Transition of Care [facility]) CM/SW Contact  Heather DELENA Saltness, LCSW Phone Number: 05/15/2024, 1:14 PM  Clinical Narrative:    Pt receiving brain radiation therapy later today. Pt not medically stable due to ongoing radiation. TOC will continue to follow.   Expected Discharge Plan:  (TBD) Barriers to Discharge: Continued Medical Work up  Expected Discharge Plan and Services In-house Referral: NA Discharge Planning Services: CM Consult Post Acute Care Choice: Durable Medical Equipment Living arrangements for the past 2 months: Single Family Home                      Social Drivers of Health (SDOH) Interventions SDOH Screenings   Food Insecurity: No Food Insecurity (05/01/2024)  Housing: Low Risk (05/01/2024)  Transportation Needs: No Transportation Needs (05/01/2024)  Utilities: Not At Risk (05/01/2024)  Social Connections: Moderately Integrated (05/01/2024)  Tobacco Use: Low Risk (05/09/2024)    Readmission Risk Interventions    05/03/2024    4:12 PM  Readmission Risk Prevention Plan  Transportation Screening Complete  PCP or Specialist Appt within 5-7 Days Complete  Home Care Screening Complete  Medication Review (RN CM) Complete    Signed: Heather Saltness, MSW, LCSW Clinical Social Worker Inpatient Care Management 05/15/2024 1:16 PM

## 2024-05-15 NOTE — Progress Notes (Signed)
 " PROGRESS NOTE  Sonya Solomon  DOB: [birth year]  PCP: Leonel Cole, MD FMW:[id number]  DOA: 04/30/2024  LOS: 15 days  Hospital Day: 16  Subjective: Patient was seen and examined this morning. Lying on bed.  Open eyes and command. Not in distress.  Family not at bedside. Afebrile, hemodynamically stable. Noted plan for brain radiation today  Brief narrative: Sonya Solomon is a 79 y.o. female with PMH significant for DM2, HTN, HLD, CKD 3A, obesity, MS, gait disturbance, vertigo, urinary urgency, spasticity, insomnia Lives at home with her husband. At baseline, walks with a walker and cane and independent with ADLs  12/28, patient presented to ED after a fall, diarrhea She was found to be very dehydrated with creatinine significantly elevated to 5.4 CT abdomen pelvis showed pancolitis Admitted to [facility] course was complicated by new diagnosis of brain mets from lung primary. 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  CT head was obtained which showed multiple intracranial lesions suggestive of metastatic disease with vasogenic edema, local mass effect Follow-up MRI brain was obtained which confirmed multiple enhancing nodules within the cerebral cerebellar hemisphere bilaterally suspicious for metastatic disease. 1/4 CT chest showed 18 x 14 mm irregular nodular density in the right middle lobe.  Seen by pulmonary 1/6, underwent flexible video fiberoptic bronchoscopy with robotic assistance and biopsies.  Biopsy report showed adenocarcinoma of lung. Medical oncology, radiation oncology and palliative care consulted. 1/10, MRI brain showed the same findings as previous MRI from 1/3 with no new lesions 1/10, MRI total spine did not show any evidence of metastasis but showed a minimally increased in size 12 mm enhancing intradural, extramedullary mass at T11, possibly meningioma,  compared to MRI from 02/17/2024 1/12, noted a plan for brain radiation today. Currently on Keppra  750 mg twice daily and Decadron  4 mg every 8 hours,  Acute pancolitis Initially presented with diarrhea dehydration CT scan on admission showed pancolitis Diarrhea seems to have improved If constipated, to use MiraLAX  and senna and Dulcolax   Hypotension H/o hypertension Hypotension resolved with fluids, midodrine  and Solu-Cortef  Lisinopril  on hold Currently also on DDAVP  0.1 mg nightly which is her home dose.  AKI on CKD 3A Hyperkalemia Natremia Baseline creatinine 1.1.  Presented with creatinine elevated to 5.4 due to diarrhea dehydration.  With IV hydration, creatinine significant improved now.  Renal ultrasound without any obstruction. Potassium level was elevated as high as 5.8 on 1/9.  Improved to normal with 1 dose of Lokelma   1/11, sodium level running low at 134 today.  Likely from poor oral intake Recent Labs  Lab 05/11/24 0651 05/12/24 0619 05/13/24 1014 05/14/24 0549  NA 137 136 134* 135  K 5.2* 5.8* 4.5 4.9  CL 104 103 98 100  CO2 26 24 23 23   GLUCOSE 234* 225* 216* 158*  BUN 32* 31* 32* 28*  CREATININE 1.06* 0.99 0.89 0.86  CALCIUM 8.9 8.9 8.7* 8.5*   E. coli UTI, POA  completed treatment with Rocephin .   Acute urinary retention Foley catheter was inserted in the ED.  Patient feels very weak and wants to continue Foley for now.  I have discussed with her the risk of long-term Foley dependence.  Will offer voiding trial again once her mobility improves.      Demand ischemia with NSVT SVT- Troponin trend relatively flat 126, 141, 169 Echocardiogram negative for regional wall motion abnormality, EF 70 to 75%,  grade 1 diastolic dysfunction, elevated pulmonary artery systolic pressure, LV hypertrophy severe?     Cardiology recommend outpatient follow-up  Seizure disorder Continue Keppra     Multiple sclerosis followed by Dr. Vear neurology as an outpatient.    Seen by PT OT recommending SNF.   CIR saw the patient did not think she is a candidate for CIR. She seems to be much weaker currently than her baseline 3 weeks ago.  Needs to encourage ambulation.  Type 2 diabetes mellitus  hyperglycemia due to steroids A1c controlled at 7 on 04/30/2024 PTA meds-metformin , Actos , Ozempic  Currently on Lantus  12 units daily, scheduled Premeal aspart 3 units 3 times daily, SSI with Accu-Cheks.   Recent Labs  Lab 05/14/24 1106 05/14/24 1630 05/14/24 2126 05/15/24 0736 05/15/24 1145  GLUCAP 182* 168* 175* 180* 234*   Hyperlipidemia  continue Zocor   Anxiety/depression Xanax  0.25 mg 3 times daily as needed    Nutrition Status:         Mobility:   PT Orders:   PT Follow up Rec: Skilled Nursing-Short Term Rehab (<3 Hours/Day)05/05/2024 1211    Goals of care   Code Status: Full Code  Palliative care consulted    DVT prophylaxis:  Place and maintain sequential compression device Start: 05/10/24 1520   Antimicrobials: None currently Fluid: None Consultants: Oncology, palliative care Family Communication: None at bedside.    Status: Inpatient Level of care:  Med-Surg   Patient is from: Home Needs to continue in-hospital care: Cancer care started.  Radiation ongoing Anticipated d/c to: SNF recommended by PT   Diet:  Diet Order             Diet Carb Modified Room service appropriate? Yes  Diet effective now                   Scheduled Meds:  acetaminophen   1,000 mg Oral TID   Chlorhexidine  Gluconate Cloth  6 each Topical Daily   desmopressin   0.1 mg Oral QHS   dexamethasone   4 mg Oral Q8H   feeding supplement  237 mL Oral BID BM   insulin  aspart  0-5 Units Subcutaneous QHS   insulin  aspart  0-6 Units Subcutaneous TID WC   insulin  aspart  3 Units Subcutaneous TID WC   insulin  glargine-yfgn  12 Units Subcutaneous Daily   levETIRAcetam   750 mg Oral BID   pantoprazole   40 mg Oral Daily   polyethylene glycol  17 g  Oral BID   simvastatin   40 mg Oral q1800    PRN meds: ALPRAZolam , docusate sodium , hydrALAZINE , HYDROmorphone  (DILAUDID ) injection, LORazepam , melatonin, mouth rinse, oxyCODONE , prochlorperazine    Infusions:    Antimicrobials: Anti-infectives (From admission, onward)    Start     Dose/Rate Route Frequency Ordered Stop   05/01/24 2200  cefTRIAXone  (ROCEPHIN ) 1 g in sodium chloride  0.9 % 100 mL IVPB        1 g 200 mL/hr over 30 Minutes Intravenous Every 24 hours 05/01/24 0439 05/06/24 0751   04/30/24 2300  cefTRIAXone  (ROCEPHIN ) 1 g in sodium chloride  0.9 % 100 mL IVPB        1 g 200 mL/hr over 30 Minutes Intravenous  Once 04/30/24 2255 04/30/24 2329       Objective: Vitals:   05/14/24 2106 05/15/24 0449  BP: (!) 100/53 121/68  Pulse: (!) 52 (!) 55  Resp: 18 18  Temp: 97.6 F (36.4 C) (!) 97.5 F (36.4 C)  SpO2: 99% 100%    Intake/Output Summary (  Last 24 hours) at 05/15/2024 1253 Last data filed at 05/15/2024 0915 Gross per 24 hour  Intake 340 ml  Output 1100 ml  Net -760 ml   Filed Weights   04/30/24 2001 04/30/24 2015  Weight: 89.6 kg 89.6 kg   Weight change:  Body mass index is 33.38 kg/m.   Physical Exam: General exam: Pleasant, elderly Caucasian female.  Not in distress. Skin: No rashes, lesions or ulcers. HEENT: Atraumatic, normocephalic, no obvious bleeding Lungs: Clear to auscultation bilaterally,  CVS: S1, S2, no murmur,   GI/Abd: Soft, nontender, nondistended, bowel sound present,   CNS: Opens eyes to command.  Able to answer orientation questions.  Slow to respond Psychiatry: sad affect.  Seems overwhelmed with the new diagnosis of metastatic cancer. Extremities: No pedal edema, no calf tenderness,   Data Review: I have personally reviewed the laboratory data and studies available.  F/u labs ordered Unresulted Labs (From admission, onward)     Start     Ordered   05/12/24 0500  CBC with Differential/Platelet  Tomorrow morning,   R        Question:  Specimen collection method  Answer:  Lab=Lab collect   05/11/24 1326            Signed, Chapman Rota, MD [facility] 05/15/2024  "

## 2024-05-16 ENCOUNTER — Ambulatory Visit

## 2024-05-16 ENCOUNTER — Other Ambulatory Visit: Payer: Self-pay

## 2024-05-16 DIAGNOSIS — R911 Solitary pulmonary nodule: Secondary | ICD-10-CM | POA: Diagnosis not present

## 2024-05-16 LAB — BASIC METABOLIC PANEL WITH GFR
Anion gap: 7 (ref 5–15)
BUN: 31 mg/dL — ABNORMAL HIGH (ref 8–23)
CO2: 29 mmol/L (ref 22–32)
Calcium: 8.7 mg/dL — ABNORMAL LOW (ref 8.9–10.3)
Chloride: 96 mmol/L — ABNORMAL LOW (ref 98–111)
Creatinine, Ser: 0.97 mg/dL (ref 0.44–1.00)
GFR, Estimated: >60 mL/min
Glucose, Bld: 126 mg/dL — ABNORMAL HIGH (ref 70–99)
Potassium: 5.1 mmol/L (ref 3.5–5.1)
Sodium: 132 mmol/L — ABNORMAL LOW (ref 135–145)

## 2024-05-16 LAB — RAD ONC ARIA SESSION SUMMARY
Course Elapsed Days: 1
Plan Fractions Treated to Date: 2
Plan Prescribed Dose Per Fraction: 3 Gy
Plan Total Fractions Prescribed: 10
Plan Total Prescribed Dose: 30 Gy
Reference Point Dosage Given to Date: 6 Gy
Reference Point Session Dosage Given: 3 Gy
Session Number: 2

## 2024-05-16 LAB — GLUCOSE, CAPILLARY
Glucose-Capillary: 118 mg/dL — ABNORMAL HIGH (ref 70–99)
Glucose-Capillary: 134 mg/dL — ABNORMAL HIGH (ref 70–99)
Glucose-Capillary: 168 mg/dL — ABNORMAL HIGH (ref 70–99)
Glucose-Capillary: 206 mg/dL — ABNORMAL HIGH (ref 70–99)

## 2024-05-16 LAB — MAGNESIUM: Magnesium: <0.5 mg/dL — CL (ref 1.7–2.4)

## 2024-05-16 MED ORDER — MAGNESIUM SULFATE 4 GM/100ML IV SOLN
4.0000 g | Freq: Once | INTRAVENOUS | Status: AC
  Administered 2024-05-16: 4 g via INTRAVENOUS
  Filled 2024-05-16: qty 100

## 2024-05-16 MED ORDER — MAGNESIUM OXIDE -MG SUPPLEMENT 400 (240 MG) MG PO TABS
400.0000 mg | ORAL_TABLET | Freq: Two times a day (BID) | ORAL | Status: AC
  Administered 2024-05-16 – 2024-05-17 (×3): 400 mg via ORAL
  Filled 2024-05-16 (×3): qty 1

## 2024-05-16 MED ORDER — INSULIN ASPART 100 UNIT/ML IJ SOLN
6.0000 [IU] | Freq: Three times a day (TID) | INTRAMUSCULAR | Status: DC
  Administered 2024-05-16 – 2024-05-26 (×21): 6 [IU] via SUBCUTANEOUS
  Filled 2024-05-16 (×18): qty 6

## 2024-05-16 NOTE — Plan of Care (Signed)

## 2024-05-16 NOTE — Progress Notes (Signed)
 " PROGRESS NOTE  Sonya Solomon  DOB: [birth year]  PCP: Leonel Cole, MD FMW:[id number]  DOA: 04/30/2024  LOS: 16 days  Hospital Day: 17  Subjective: Patient was seen and examined this morning. Lying on bed.  Not in distress. Afebrile, bradycardic to 40s and 50s, breathing on 2 L oxygen Labs from this morning with sodium 132, magnesium  level significantly low at less than 0.5 Foley catheter was noted to be leaking last night.  purewick catheter in today.    Brief narrative: Sonya Solomon is a 79 y.o. female with PMH significant for DM2, HTN, HLD, CKD 3A, obesity, MS, gait disturbance, vertigo, urinary urgency, spasticity, insomnia Lives at home with her husband. At baseline, walks with a walker and cane and independent with ADLs  12/28, patient presented to ED after a fall, diarrhea She was found to be very dehydrated with creatinine significantly elevated to 5.4 CT abdomen pelvis showed pancolitis Admitted to [facility] course was complicated by new diagnosis of brain mets from lung primary. 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  CT head was obtained which showed multiple intracranial lesions suggestive of metastatic disease with vasogenic edema, local mass effect Follow-up MRI brain was obtained which confirmed multiple enhancing nodules within the cerebral cerebellar hemisphere bilaterally suspicious for metastatic disease. 1/4 CT chest showed 18 x 14 mm irregular nodular density in the right middle lobe.  Seen by pulmonary 1/6, underwent flexible video fiberoptic bronchoscopy with robotic assistance and biopsies.  Biopsy report showed adenocarcinoma of lung. Medical oncology, radiation oncology and palliative care consulted. 1/10, MRI brain showed the same findings as previous MRI from 1/3 with no new lesions 1/10, MRI total spine did not show any evidence of metastasis but  showed a minimally increased in size 12 mm enhancing intradural, extramedullary mass at T11, possibly meningioma, compared to MRI from 02/17/2024 1/12, noted a plan for brain radiation today. Currently on Keppra  750 mg twice daily and Decadron  4 mg every 8 hours,  Acute pancolitis Initially presented with diarrhea dehydration CT scan on admission showed pancolitis Diarrhea seems to have improved If constipated, to use MiraLAX  and senna and Dulcolax   Hypotension H/o hypertension Hypotension resolved with fluids, midodrine  and Solu-Cortef  Lisinopril  on hold Currently also on DDAVP  0.1 mg nightly which is her home dose.  AKI on CKD 3A -resolved Hyperkalemia -resolved Severe hypomagnesemia Hyponatremia Baseline creatinine 1.1.  Presented with creatinine elevated to 5.4 due to diarrhea dehydration.  With IV hydration, creatinine significant improved now.  Renal ultrasound without any obstruction. Potassium level was elevated as high as 5.8 on 1/9.  Improved to normal with 1 dose of Lokelma   1/13, magnesium  level is significantly low at less than 0.5.  Started on IV and oral replacement.. Recent Labs  Lab 05/11/24 [phone] 05/12/24 0619 05/13/24 1014 05/14/24 0549 05/16/24 0647  NA 137 136 134* 135 132*  K 5.2* 5.8* 4.5 4.9 5.1  CL 104 103 98 100 96*  CO2 26 24 23 23 29   GLUCOSE 234* 225* 216* 158* 126*  BUN 32* 31* 32* 28* 31*  CREATININE 1.06* 0.99 0.89 0.86 0.97  CALCIUM 8.9 8.9 8.7* 8.5* 8.7*  MG  --   --   --   --  <0.5*   E. coli UTI, POA  completed treatment with Rocephin .   Acute urinary retention Foley catheter was inserted in the ED. because of profound weakness and immobility,  Foley catheter was continued. She had leakage with Foley catheter last night and it was removed. To use Purewick today.    Demand ischemia with NSVT SVT- Troponin trend relatively flat 126, 141, 169 Echocardiogram negative for regional wall motion abnormality, EF 70 to 75%, grade 1 diastolic  dysfunction, elevated pulmonary artery systolic pressure, LV hypertrophy severe?     Cardiology recommend outpatient follow-up  Seizure disorder Continue Keppra     Multiple sclerosis followed by Dr. Vear neurology as an outpatient.   Seen by PT OT recommending SNF.   CIR saw the patient did not think she is a candidate for CIR. She seems to be much weaker currently than her baseline 3 weeks ago.  Needs to encourage ambulation.  Type 2 diabetes mellitus  hyperglycemia due to steroids A1c controlled at 7 on 04/30/2024 PTA meds-metformin , Actos , Ozempic  Currently on Lantus  12 units daily, scheduled Premeal aspart 3 units 3 times daily, SSI with Accu-Cheks.   Recent Labs  Lab 05/15/24 1145 05/15/24 1646 05/15/24 2124 05/16/24 0732 05/16/24 1211  GLUCAP 234* 130* 117* 118* 134*   Hyperlipidemia  continue Zocor   Anxiety/depression Xanax  0.25 mg 3 times daily as needed    Nutrition Status:         Mobility:   PT Orders:   PT Follow up Rec: Skilled Nursing-Short Term Rehab (<3 Hours/Day)05/05/2024 1211    Goals of care   Code Status: Full Code  Palliative care consulted    DVT prophylaxis:  Place and maintain sequential compression device Start: 05/10/24 1520   Antimicrobials: None currently Fluid: None Consultants: Oncology, palliative care Family Communication: None at bedside.    Status: Inpatient Level of care:  Med-Surg   Patient is from: Home Needs to continue in-hospital care: Cancer care started.  Radiation ongoing Anticipated d/c to: SNF recommended by PT   Diet:  Diet Order             Diet Carb Modified Room service appropriate? Yes  Diet effective now                   Scheduled Meds:  acetaminophen   1,000 mg Oral TID   desmopressin   0.1 mg Oral QHS   dexamethasone   4 mg Oral Q8H   feeding supplement  237 mL Oral BID BM   insulin  aspart  0-5 Units Subcutaneous QHS   insulin  aspart  0-6 Units Subcutaneous TID WC   insulin   aspart  3 Units Subcutaneous TID WC   insulin  glargine-yfgn  12 Units Subcutaneous Daily   levETIRAcetam   750 mg Oral BID   magnesium  oxide  400 mg Oral BID   pantoprazole   40 mg Oral Daily   polyethylene glycol  17 g Oral BID   simvastatin   40 mg Oral q1800    PRN meds: ALPRAZolam , docusate sodium , hydrALAZINE , HYDROmorphone  (DILAUDID ) injection, LORazepam , melatonin, mouth rinse, oxyCODONE , prochlorperazine    Infusions:    Antimicrobials: Anti-infectives (From admission, onward)    Start     Dose/Rate Route Frequency Ordered Stop   05/01/24 2200  cefTRIAXone  (ROCEPHIN ) 1 g in sodium chloride  0.9 % 100 mL IVPB        1 g 200 mL/hr over 30 Minutes Intravenous Every 24 hours 05/01/24 0439 05/06/24 0751   04/30/24 2300  cefTRIAXone  (ROCEPHIN ) 1 g in sodium chloride  0.9 % 100 mL IVPB        1 g 200 mL/hr over 30 Minutes Intravenous  Once 04/30/24 2255 04/30/24 2329  Objective: Vitals:   05/16/24 0546 05/16/24 1311  BP: 122/69 (!) 110/54  Pulse: (!) 47   Resp: 16 15  Temp: (!) 97.5 F (36.4 C) (!) 97.5 F (36.4 C)  SpO2: 100% 100%    Intake/Output Summary (Last 24 hours) at 05/16/2024 1326 Last data filed at 05/16/2024 0600 Gross per 24 hour  Intake 360 ml  Output 350 ml  Net 10 ml   Filed Weights   04/30/24 2001 04/30/24 2015  Weight: 89.6 kg 89.6 kg   Weight change:  Body mass index is 33.38 kg/m.   Physical Exam: General exam: Pleasant, elderly Caucasian female.  Not in distress.  Awake Skin: No rashes, lesions or ulcers. HEENT: Atraumatic, normocephalic, no obvious bleeding Lungs: Clear to auscultation bilaterally,  CVS: S1, S2, no murmur,   GI/Abd: Soft, nontender, nondistended, bowel sound present,   CNS: Opens eyes to command.  Able to answer orientation questions.  Slow to respond Extremities: No pedal edema, no calf tenderness,   Data Review: I have personally reviewed the laboratory data and studies available.  F/u labs ordered Unresulted  Labs (From admission, onward)     Start     Ordered   05/17/24 0500  Basic metabolic panel with GFR  Tomorrow morning,   R       Question:  Specimen collection method  Answer:  Lab=Lab collect   05/16/24 1326   05/17/24 0500  CBC with Differential/Platelet  Tomorrow morning,   R       Question:  Specimen collection method  Answer:  Lab=Lab collect   05/16/24 1326   05/12/24 0500  CBC with Differential/Platelet  Tomorrow morning,   R       Question:  Specimen collection method  Answer:  Lab=Lab collect   05/11/24 1326            Signed, Chapman Rota, MD [facility] 05/16/2024  "

## 2024-05-16 NOTE — Progress Notes (Signed)
 Pts Foley catheter noted to leak intermittently overnight. Leakage observed at the insertion site, not from the tubing. Additional sterile water added to balloon with no resolution of leakage. Catheter continues to drain; however, pt reports sensation of urination despite Foley in place. No urinary urgency or bladder spasms noted. On call provider has been notified.

## 2024-05-16 NOTE — Plan of Care (Signed)
   Problem: Education: Goal: Ability to describe self-care measures that may prevent or decrease complications (Diabetes Survival Skills Education) will improve Outcome: Progressing Goal: Individualized Educational Video(s) Outcome: Progressing

## 2024-05-16 NOTE — Plan of Care (Signed)

## 2024-05-16 NOTE — Progress Notes (Signed)
 Request for pt's tissue be sent to [facility] medicine for molecular studies emailed to Cheril Fuse, [facility] Path technician.

## 2024-05-16 NOTE — Progress Notes (Signed)
 "                                                                                                                                                                                                          Daily Progress Note   Patient Name: Sonya Solomon       Date: 05/16/2024 DOB: [birth year]  Age: 79 y.o. MRN#: [id number] Attending Physician: Arlice Reichert, MD Primary Care Physician: Leonel Cole, MD Admit Date: 04/30/2024  Reason for Follow-up: Establishing goals of care  Patient Profile/HPI: 79 y.o. female  with past medical history of MS, type 2 diabetes, hyperlipidemia, hypertension, obesity,, spasticity, insomnia, anxiety and depression, seizure disorder admitted on 04/30/2024 with weakness, poor oral intake, dizziness, and fall.    Patient was initially admitted for AKI on CKD 3 due to poor intake and diarrhea, having hypotension and metabolic acidosis as well.  AKI attributed to E. coli UTI.  During hospitalization, patient was worked up for diplopia.  She was found to have dozens of enhancing brain lesions on imaging concerning for metastases.  Further imaging obtained showed right middle lobe lung nodule, left adrenal nodule, neck thyroid  nodule.  Biopsy of lung nodule obtained with bronchoscopy on 1/6.  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  She was initially planned for simulation in anticipation of whole brain radiation on 1/8 however she was confused and refused treatment. She had first radiation treatment on 1/12. Medical Oncology recommends outpatient follow-up for their recommendations after molecular testing completed.    PMT has been consulted to assist with goals of care conversation in light of significant weakness diagnosed lung cancer with multiple metastasis.  Discussion: Chart reviewed- per Oncology Dr. Sherrod note - plan is for whole brain radiation and to followup as outpatient for medical oncology recommendations.  Per attending Dr. Arlice-  pancolitis diarrhea is improving, patient weak and wants to keep foley in.  Labs- Cr 0.97 improved renal function from admission. Regular glucose checks without hypoglycemia.  On evaluation patient is sleeping.  She has been reluctant to discuss goals of care with Palliative medicine.    ROS- sleeping   Physical Exam Vitals and nursing note reviewed.  Cardiovascular:     Rate and Rhythm: Normal rate.  Pulmonary:     Effort: Pulmonary effort is normal.  Neurological:     Comments: asleep             Vital Signs: BP 122/69 (BP Location: Left Arm)   Pulse (!) 47   Temp (!) 97.5 F (36.4 C) (  Oral)   Resp 16   Ht 5' 4.5 (1.638 m)   Wt 89.6 kg   SpO2 100%   BMI 33.38 kg/m  SpO2: SpO2: 100 % O2 Device: O2 Device: Nasal Cannula O2 Flow Rate: O2 Flow Rate (L/min): 2 L/min  Intake/output summary:  Intake/Output Summary (Last 24 hours) at 05/16/2024 1226 Last data filed at 05/16/2024 0600 Gross per 24 hour  Intake 360 ml  Output 350 ml  Net 10 ml   LBM: Last BM Date : 05/16/24 Baseline Weight: Weight: 89.6 kg Most recent weight: Weight: 89.6 kg       Palliative Assessment/Data: PPS: 40%      Patient Active Problem List   Diagnosis Date Noted   Pulmonary nodule 1 cm or greater in diameter 05/07/2024   CKD stage 3a, GFR 45-59 ml/min (HCC) 05/01/2024   Hyperkalemia 05/01/2024   Demand ischemia (HCC) 05/01/2024   Pyuria 05/01/2024   Diarrhea 05/01/2024   Seizure (HCC) 05/01/2024   HTN (hypertension) 05/01/2024   AKI (acute kidney injury) 04/30/2024   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   Senile purpura 08/29/2020   Vertigo 03/26/2020   Diplopia 03/26/2020   Gait disturbance 03/26/2020   Urinary urgency 03/26/2020   MS (multiple sclerosis) 12/13/2019   Hyperlipidemia associated with type 2 diabetes mellitus (HCC) 12/13/2019   Type 2 diabetes mellitus with other specified complication (HCC) 12/13/2019   Abnormal electrocardiogram 03/09/2011     Palliative Care Assessment & Plan    Assessment/Recommendations/Plan  Newly diagnosed stage IV cancer- patient has been reluctant to engage with Palliative medicine team PMT will follow intermittently- please call if acute needs arise   Code Status:   Code Status: Full Code   Prognosis:  Unable to determine  Discharge Planning: To Be Determined    Thank you for allowing the Palliative Medicine Team to assist in the care of this patient.  I personally spent a total of 35 minutes in the care of the patient today including preparing to see the patient, getting/reviewing separately obtained history, performing a medically appropriate exam/evaluation, and documenting clinical information in the EHR.   Cassondra Stain, AGNP-C Palliative Medicine   Please contact Palliative Medicine Team phone at [phone] for questions and concerns.        "

## 2024-05-17 ENCOUNTER — Other Ambulatory Visit: Payer: Self-pay

## 2024-05-17 ENCOUNTER — Ambulatory Visit

## 2024-05-17 DIAGNOSIS — R911 Solitary pulmonary nodule: Secondary | ICD-10-CM | POA: Diagnosis not present

## 2024-05-17 LAB — CBC WITH DIFFERENTIAL/PLATELET
Abs Immature Granulocytes: 0.09 K/uL — ABNORMAL HIGH (ref 0.00–0.07)
Basophils Absolute: 0 K/uL (ref 0.0–0.1)
Basophils Relative: 0 %
Eosinophils Absolute: 0 K/uL (ref 0.0–0.5)
Eosinophils Relative: 0 %
HCT: 37.4 % (ref 36.0–46.0)
Hemoglobin: 12.7 g/dL (ref 12.0–15.0)
Immature Granulocytes: 1 %
Lymphocytes Relative: 3 %
Lymphs Abs: 0.4 K/uL — ABNORMAL LOW (ref 0.7–4.0)
MCH: 31.4 pg (ref 26.0–34.0)
MCHC: 34 g/dL (ref 30.0–36.0)
MCV: 92.6 fL (ref 80.0–100.0)
Monocytes Absolute: 0.5 K/uL (ref 0.1–1.0)
Monocytes Relative: 4 %
Neutro Abs: 10.9 K/uL — ABNORMAL HIGH (ref 1.7–7.7)
Neutrophils Relative %: 92 %
Platelets: 241 K/uL (ref 150–400)
RBC: 4.04 MIL/uL (ref 3.87–5.11)
RDW: 15.4 % (ref 11.5–15.5)
WBC: 11.9 K/uL — ABNORMAL HIGH (ref 4.0–10.5)
nRBC: 0 % (ref 0.0–0.2)

## 2024-05-17 LAB — GLUCOSE, CAPILLARY
Glucose-Capillary: 201 mg/dL — ABNORMAL HIGH (ref 70–99)
Glucose-Capillary: 212 mg/dL — ABNORMAL HIGH (ref 70–99)
Glucose-Capillary: 200 mg/dL — ABNORMAL HIGH (ref 70–99)
Glucose-Capillary: 266 mg/dL — ABNORMAL HIGH (ref 70–99)

## 2024-05-17 LAB — RAD ONC ARIA SESSION SUMMARY
Course Elapsed Days: 2
Plan Fractions Treated to Date: 3
Plan Prescribed Dose Per Fraction: 3 Gy
Plan Total Fractions Prescribed: 10
Plan Total Prescribed Dose: 30 Gy
Reference Point Dosage Given to Date: 9 Gy
Reference Point Session Dosage Given: 3 Gy
Session Number: 3

## 2024-05-17 LAB — BASIC METABOLIC PANEL WITH GFR
Anion gap: 10 (ref 5–15)
BUN: 41 mg/dL — ABNORMAL HIGH (ref 8–23)
CO2: 25 mmol/L (ref 22–32)
Calcium: 8.5 mg/dL — ABNORMAL LOW (ref 8.9–10.3)
Chloride: 97 mmol/L — ABNORMAL LOW (ref 98–111)
Creatinine, Ser: 1.04 mg/dL — ABNORMAL HIGH (ref 0.44–1.00)
GFR, Estimated: 55 mL/min — ABNORMAL LOW
Glucose, Bld: 201 mg/dL — ABNORMAL HIGH (ref 70–99)
Potassium: 4.9 mmol/L (ref 3.5–5.1)
Sodium: 132 mmol/L — ABNORMAL LOW (ref 135–145)

## 2024-05-17 LAB — URINALYSIS, ROUTINE W REFLEX MICROSCOPIC
Bilirubin Urine: NEGATIVE
Glucose, UA: 50 mg/dL — AB
Hgb urine dipstick: NEGATIVE
Ketones, ur: 5 mg/dL — AB
Nitrite: NEGATIVE
Protein, ur: NEGATIVE mg/dL
Specific Gravity, Urine: 1.029 (ref 1.005–1.030)
pH: 5 (ref 5.0–8.0)

## 2024-05-17 LAB — MAGNESIUM: Magnesium: 2.4 mg/dL (ref 1.7–2.4)

## 2024-05-17 MED ORDER — INSULIN GLARGINE-YFGN 100 UNIT/ML ~~LOC~~ SOLN
15.0000 [IU] | Freq: Every day | SUBCUTANEOUS | Status: DC
  Administered 2024-05-18 – 2024-05-26 (×9): 15 [IU] via SUBCUTANEOUS
  Filled 2024-05-17 (×9): qty 0.15

## 2024-05-17 NOTE — Plan of Care (Signed)
  Problem: Metabolic: Goal: Ability to maintain appropriate glucose levels will improve Outcome: Progressing   Problem: Nutritional: Goal: Maintenance of adequate nutrition will improve Outcome: Progressing   Problem: Skin Integrity: Goal: Risk for impaired skin integrity will decrease Outcome: Progressing   

## 2024-05-17 NOTE — Progress Notes (Addendum)
 " PROGRESS NOTE  Sonya Solomon  DOB: May 14, 1945  PCP: Leonel Cole, MD FMW:[id number]  DOA: 04/30/2024  LOS: 17 days  Hospital Day: 18  Subjective: Patient was seen and examined this morning. Lying on bed.  Not in distress. Somnolent.  Opens eyes to answer few questions. Reluctant to answer any other questions.  Patient has been reluctant to work with PT as well.  She states she is afraid somebody is going to drop her on the floor so she does not want to work with PT. Afebrile, hemodynamically stable, on 2 L oxygen Labs this morning with sodium 132, blood glucose level 200  I saw the patient again this afternoon.  Her husband was at bedside.  Husband did not have anything to ask but patient was very furious at me.  She stated 'you keep pushing me to work with physical therapy which I do not want to.  I just want to focus on my cancer care. She used curse words to describe the physical therapist who 'keep pushing' her. I did my best to educate her about the need to participate physical therapy to prevent her from getting bedbound. She doesn't want anyone to talk abt it ever again.   Brief narrative: INDEA DEARMAN is a 79 y.o. female with PMH significant for DM2, HTN, HLD, CKD 3A, obesity, MS, gait disturbance, vertigo, urinary urgency, spasticity, insomnia Lives at home with her husband. At baseline, walks with a walker and cane and independent with ADLs  12/28, patient presented to ED after a fall, diarrhea She was found to be very dehydrated with creatinine significantly elevated to 5.4 CT abdomen pelvis showed pancolitis Admitted to [facility] course was complicated by new diagnosis of brain mets from lung primary. 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  CT head was obtained which showed multiple intracranial lesions suggestive of metastatic disease with vasogenic edema, local  mass effect Follow-up MRI brain was obtained which confirmed multiple enhancing nodules within the cerebral cerebellar hemisphere bilaterally suspicious for metastatic disease. 1/4 CT chest showed 18 x 14 mm irregular nodular density in the right middle lobe.  Seen by pulmonary 1/6, underwent flexible video fiberoptic bronchoscopy with robotic assistance and biopsies.  Biopsy report showed adenocarcinoma of lung. Medical oncology, radiation oncology and palliative care consulted. 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 Currently on Keppra  750 mg twice daily and Decadron  4 mg every 8 hours,  Acute pancolitis Initially presented with diarrhea dehydration CT scan on admission showed pancolitis Diarrhea seems to have improved If constipated, to use MiraLAX  and senna and Dulcolax   Hypotension H/o hypertension Hypotension resolved with fluids, midodrine  and Solu-Cortef  Lisinopril  on hold Currently also on DDAVP  0.1 mg nightly which is her home dose.  AKI on CKD 3A -resolved Hyperkalemia -resolved Severe hypomagnesemia Hyponatremia Baseline creatinine 1.1.  Presented with creatinine elevated to 5.4 due to diarrhea dehydration.  With IV hydration, creatinine significant improved now.  Renal ultrasound without any obstruction. Potassium level was elevated as high as 5.8 on 1/9.  Improved to normal with 1 dose of Lokelma   1/13, magnesium  level is significantly  low at less than 0.5.  Improved with replacement Sodium level remains mildly low because of poor intake Recent Labs  Lab 05/12/24 0619 05/13/24 1014 05/14/24 0549 05/16/24 0647 05/17/24 0601  NA 136 134* 135 132* 132*  K  5.8* 4.5 4.9 5.1 4.9  CL 103 98 100 96* 97*  CO2 24 23 23 29 25   GLUCOSE 225* 216* 158* 126* 201*  BUN 31* 32* 28* 31* 41*  CREATININE 0.99 0.89 0.86 0.97 1.04*  CALCIUM 8.9 8.7* 8.5* 8.7* 8.5*  MG  --   --   --  <0.5* 2.4   E. coli UTI, POA  completed treatment with Rocephin .   Acute urinary retention Foley catheter was inserted in the ED. because of profound weakness and immobility, Foley catheter was continued. She had leakage with Foley catheter on 1/13 and it was removed.  Started on on PureWick.    Demand ischemia with NSVT SVT- Troponin trend relatively flat 126, 141, 169 Echocardiogram negative for regional wall motion abnormality, EF 70 to 75%, grade 1 diastolic dysfunction, elevated pulmonary artery systolic pressure, LV hypertrophy severe?     Cardiology recommend outpatient follow-up  Seizure disorder Continue Keppra     Multiple sclerosis followed by Dr. Vear neurology as an outpatient.   Seen by PT OT recommending SNF.   CIR saw the patient did not think she is a candidate for CIR. She seems to be much weaker currently than her baseline 3 weeks prior to this presentation.  Needs to encourage ambulation. Patient has been reluctant to work with PT as well.  She states she is afraid somebody is going to drop her on the floor so she does not want to work with PT.  Type 2 diabetes mellitus  hyperglycemia due to steroids A1c controlled at 7 on 04/30/2024 PTA meds-metformin , Actos , Ozempic  Currently on Lantus  12 units daily, scheduled Premeal aspart 6 units 3 times daily, SSI with Accu-Cheks.   Blood sugar level elevated over 200.  Increase Semglee  to 15 units from tomorrow. Recent Labs  Lab 05/16/24 1644 05/16/24 2104 05/17/24 0711 05/17/24 1141 05/17/24 1648  GLUCAP 168* 206* 201* 212* 200*   Hyperlipidemia  continue Zocor   Anxiety/depression Xanax  0.25 mg 3 times daily as needed    Nutrition Status:         Mobility:   PT Orders:   PT Follow  up Rec: Skilled Nursing-Short Term Rehab (<3 Hours/Day)05/05/2024 1211    Goals of care   Code Status: Full Code  Palliative care following.    DVT prophylaxis:  Place and maintain sequential compression device Start: 05/10/24 1520   Antimicrobials: None currently Fluid: None Consultants: Oncology, palliative care Family Communication: husband at bedside.    Status: Inpatient Level of care:  Med-Surg   Patient is from: Home Needs to continue in-hospital care: Radiation treatment to continue through 1/23. Anticipated d/c to: SNF recommended by PT   Diet:  Diet Order             Diet Carb Modified Room service appropriate? Yes  Diet effective now                   Scheduled Meds:  acetaminophen   1,000 mg Oral TID   desmopressin   0.1 mg Oral QHS   dexamethasone   4 mg Oral Q8H   feeding supplement  237 mL Oral BID BM   insulin  aspart  0-5 Units Subcutaneous QHS   insulin  aspart  0-6 Units Subcutaneous TID WC  insulin  aspart  6 Units Subcutaneous TID WC   [START ON 05/18/2024] insulin  glargine-yfgn  15 Units Subcutaneous Daily   levETIRAcetam   750 mg Oral BID   pantoprazole   40 mg Oral Daily   polyethylene glycol  17 g Oral BID   simvastatin   40 mg Oral q1800    PRN meds: ALPRAZolam , docusate sodium , hydrALAZINE , HYDROmorphone  (DILAUDID ) injection, LORazepam , melatonin, mouth rinse, oxyCODONE , prochlorperazine    Infusions:    Antimicrobials: Anti-infectives (From admission, onward)    Start     Dose/Rate Route Frequency Ordered Stop   05/01/24 2200  cefTRIAXone  (ROCEPHIN ) 1 g in sodium chloride  0.9 % 100 mL IVPB        1 g 200 mL/hr over 30 Minutes Intravenous Every 24 hours 05/01/24 0439 05/06/24 0751   04/30/24 2300  cefTRIAXone  (ROCEPHIN ) 1 g in sodium chloride  0.9 % 100 mL IVPB        1 g 200 mL/hr over 30 Minutes Intravenous  Once 04/30/24 2255 04/30/24 2329       Objective: Vitals:   05/17/24 0543 05/17/24 1224  BP: 116/62 (!) 102/49  Pulse:  (!) 38 60  Resp: 17 16  Temp: 98.3 F (36.8 C) 97.8 F (36.6 C)  SpO2:  98%    Intake/Output Summary (Last 24 hours) at 05/17/2024 1654 Last data filed at 05/17/2024 1256 Gross per 24 hour  Intake --  Output 325 ml  Net -325 ml    Filed Weights   04/30/24 2001 04/30/24 2015  Weight: 89.6 kg 89.6 kg   Weight change:  Body mass index is 33.38 kg/m.   Physical Exam: General exam:  elderly Caucasian female.  Not in distress.   Skin: No rashes, lesions or ulcers. HEENT: Atraumatic, normocephalic, no obvious bleeding Lungs: Clear to auscultation bilaterally,  CVS: S1, S2, no murmur,   GI/Abd: Soft, nontender, nondistended, bowel sound present,   CNS: Alert, awake, oriented x 3 this afternoon Extremities: No pedal edema, no calf tenderness,   Data Review: I have personally reviewed the laboratory data and studies available.  F/u labs ordered Unresulted Labs (From admission, onward)     Start     Ordered   05/17/24 1405  Urinalysis, Routine w reflex microscopic -Urine, Clean Catch  Once,   R       Question:  Specimen Source  Answer:  Urine, Clean Catch   05/17/24 1404   05/12/24 0500  CBC with Differential/Platelet  Tomorrow morning,   R       Question:  Specimen collection method  Answer:  Lab=Lab collect   05/11/24 1326            Signed, Chapman Rota, MD [facility] 05/17/2024  "

## 2024-05-18 ENCOUNTER — Other Ambulatory Visit: Payer: Self-pay

## 2024-05-18 ENCOUNTER — Ambulatory Visit

## 2024-05-18 DIAGNOSIS — R911 Solitary pulmonary nodule: Secondary | ICD-10-CM | POA: Diagnosis not present

## 2024-05-18 LAB — RAD ONC ARIA SESSION SUMMARY
Course Elapsed Days: 3
Plan Fractions Treated to Date: 4
Plan Prescribed Dose Per Fraction: 3 Gy
Plan Total Fractions Prescribed: 10
Plan Total Prescribed Dose: 30 Gy
Reference Point Dosage Given to Date: 12 Gy
Reference Point Session Dosage Given: 3 Gy
Session Number: 4

## 2024-05-18 LAB — GLUCOSE, CAPILLARY
Glucose-Capillary: 194 mg/dL — ABNORMAL HIGH (ref 70–99)
Glucose-Capillary: 218 mg/dL — ABNORMAL HIGH (ref 70–99)
Glucose-Capillary: 169 mg/dL — ABNORMAL HIGH (ref 70–99)
Glucose-Capillary: 180 mg/dL — ABNORMAL HIGH (ref 70–99)

## 2024-05-18 NOTE — Progress Notes (Signed)
 " PROGRESS NOTE  Sonya Solomon  DOB: 1946/02/19  PCP: Leonel Cole, MD FMW:[id number]  DOA: 04/30/2024  LOS: 18 days  Hospital Day: 19  Subjective: Patient was seen and examined this morning. Lying in bed.  Not in distress.  Family at bedside Afebrile, hemodynamically stable, breathing on 2 L oxygen.  Brief narrative: ARCELIA PALS is a 79 y.o. female with PMH significant for DM2, HTN, HLD, CKD 3A, obesity, MS, gait disturbance, vertigo, urinary urgency, spasticity, insomnia Lives at home with her husband. At baseline, walks with a walker and cane and independent with ADLs  12/28, patient presented to ED after a fall, diarrhea She was found to be very dehydrated with creatinine significantly elevated to 5.4 CT abdomen pelvis showed pancolitis Admitted to [facility] course was complicated by new diagnosis of brain mets from lung primary. 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  CT head was obtained which showed multiple intracranial lesions suggestive of metastatic disease with vasogenic edema, local mass effect Follow-up MRI brain was obtained which confirmed multiple enhancing nodules within the cerebral cerebellar hemisphere bilaterally suspicious for metastatic disease. 1/4 CT chest showed 18 x 14 mm irregular nodular density in the right middle lobe.  Seen by pulmonary 1/6, underwent flexible video fiberoptic bronchoscopy with robotic assistance and biopsies.  Biopsy report showed adenocarcinoma of lung. Medical oncology, radiation oncology and palliative care consulted. 1/10, MRI brain showed the same findings as previous MRI from 1/3 with no new lesions 1/10, MRI total spine did not show any evidence of metastasis but showed a minimally increased in size 12 mm enhancing intradural, extramedullary mass at T11, possibly meningioma, compared to MRI from 02/17/2024 1/12, started  on radiation treatment, to last till 1/23. Currently on Keppra  750 mg twice daily and Decadron  4 mg every 8 hours,  Acute pancolitis Initially presented with diarrhea dehydration CT scan on admission showed pancolitis Diarrhea seems to have improved If constipated, to use MiraLAX  and senna and Dulcolax   Hypotension H/o hypertension Hypotension resolved with fluids, midodrine  and Solu-Cortef  Lisinopril  on hold Currently also on DDAVP  0.1 mg nightly which is her home dose.  AKI on CKD 3A -resolved Hyperkalemia -resolved Severe hypomagnesemia Hyponatremia Baseline creatinine 1.1.  Presented with creatinine elevated to 5.4 due to diarrhea dehydration.  With IV hydration, creatinine significant improved now.  Renal ultrasound without any obstruction. Potassium level was elevated as high as 5.8 on 1/9.  Improved to normal with 1 dose of Lokelma   1/13, magnesium  level is significantly low at less than 0.5.  Improved with replacement Sodium level remains mildly low because of poor intake Recent Labs  Lab 05/12/24 0619 05/13/24 1014 05/14/24 0549 05/16/24 0647 05/17/24 0601  NA 136 134* 135 132* 132*  K 5.8* 4.5 4.9 5.1 4.9  CL 103 98 100 96* 97*  CO2 24 23 23 29 25   GLUCOSE 225* 216* 158* 126* 201*  BUN 31* 32* 28* 31* 41*  CREATININE 0.99 0.89 0.86 0.97 1.04*  CALCIUM 8.9 8.7* 8.5* 8.7* 8.5*  MG  --   --   --  <0.5* 2.4   E. coli UTI, POA  completed treatment with Rocephin .   Acute urinary retention Foley catheter was inserted in the ED. because of profound weakness and immobility, Foley catheter was continued. She had leakage with Foley catheter on 1/13 and it was removed.  Started on on PureWick.    Demand  ischemia with NSVT SVT- Troponin trend relatively flat 126, 141, 169 Echocardiogram negative for regional wall motion abnormality, EF 70 to 75%, grade 1 diastolic dysfunction, elevated pulmonary artery systolic pressure, LV hypertrophy severe?     Cardiology recommend  outpatient follow-up  Seizure disorder Continue Keppra     Multiple sclerosis followed by Dr. Vear neurology as an outpatient.   Seen by PT OT earlier in the course and recommended SNF. Reports that 3 weeks prior to presentation, she was independent.  Gradually declining physically.  Worsening in the hospital especially because she strongly refuses to participate with PT.  I have told her and her husband that her likelihood of getting into a rehab/SNF is significantly low if she continues to refuse PT.    Type 2 diabetes mellitus  Hyperglycemia due to steroids A1c controlled at 7 on 04/30/2024 PTA meds-metformin , Actos , Ozempic  Currently on Lantus  15 units daily, scheduled Premeal aspart 6 units 3 times daily, SSI with Accu-Cheks.   Blood sugar level elevated over 200.  Continue to monitor.  May need to increase further tomorrow Recent Labs  Lab 05/17/24 0711 05/17/24 1141 05/17/24 1648 05/17/24 2108 05/18/24 0817  GLUCAP 201* 212* 200* 266* 194*   Hyperlipidemia  continue Zocor   Anxiety/depression Xanax  0.25 mg 3 times daily as needed    Nutrition Status:         Mobility:   PT Orders:   PT Follow up Rec: Skilled Nursing-Short Term Rehab (<3 Hours/Day)05/05/2024 1211    Goals of care   Code Status: Full Code  Palliative care following.    DVT prophylaxis:  Place and maintain sequential compression device Start: 05/10/24 1520   Antimicrobials: None currently Fluid: None Consultants: Oncology, palliative care Family Communication: husband at bedside.    Status: Inpatient Level of care:  Med-Surg   Patient is from: Home Needs to continue in-hospital care: Radiation treatment to continue through 1/23. Anticipated d/c to: SNF recommended by PT   Diet:  Diet Order             Diet Carb Modified Room service appropriate? Yes  Diet effective now                   Scheduled Meds:  acetaminophen   1,000 mg Oral TID   desmopressin   0.1 mg Oral QHS    dexamethasone   4 mg Oral Q8H   feeding supplement  237 mL Oral BID BM   insulin  aspart  0-5 Units Subcutaneous QHS   insulin  aspart  0-6 Units Subcutaneous TID WC   insulin  aspart  6 Units Subcutaneous TID WC   insulin  glargine-yfgn  15 Units Subcutaneous Daily   levETIRAcetam   750 mg Oral BID   pantoprazole   40 mg Oral Daily   polyethylene glycol  17 g Oral BID   simvastatin   40 mg Oral q1800    PRN meds: ALPRAZolam , docusate sodium , hydrALAZINE , HYDROmorphone  (DILAUDID ) injection, LORazepam , melatonin, mouth rinse, oxyCODONE , prochlorperazine    Infusions:    Antimicrobials: Anti-infectives (From admission, onward)    Start     Dose/Rate Route Frequency Ordered Stop   05/01/24 2200  cefTRIAXone  (ROCEPHIN ) 1 g in sodium chloride  0.9 % 100 mL IVPB        1 g 200 mL/hr over 30 Minutes Intravenous Every 24 hours 05/01/24 0439 05/06/24 0751   04/30/24 2300  cefTRIAXone  (ROCEPHIN ) 1 g in sodium chloride  0.9 % 100 mL IVPB        1 g 200 mL/hr over 30 Minutes  Intravenous  Once 04/30/24 2255 04/30/24 2329       Objective: Vitals:   05/17/24 2023 05/18/24 0539  BP: (!) 104/55 129/64  Pulse: (!) 54 (!) 49  Resp: 19 17  Temp: 97.6 F (36.4 C) (!) 97.5 F (36.4 C)  SpO2: 100% 100%    Intake/Output Summary (Last 24 hours) at 05/18/2024 0933 Last data filed at 05/17/2024 2345 Gross per 24 hour  Intake --  Output 525 ml  Net -525 ml    Filed Weights   04/30/24 2001 04/30/24 2015  Weight: 89.6 kg 89.6 kg   Weight change:  Body mass index is 33.38 kg/m.   Physical Exam: General exam:  elderly Caucasian female.  Not in distress.   Skin: No rashes, lesions or ulcers. HEENT: Atraumatic, normocephalic, no obvious bleeding Lungs: Clear to auscultation bilaterally,  CVS: S1, S2, no murmur,   GI/Abd: Soft, nontender, nondistended, bowel sound present,   CNS: Sleeping.  Opens eyes on command Extremities: No pedal edema, no calf tenderness,   Data Review: I have  personally reviewed the laboratory data and studies available.  F/u labs ordered Unresulted Labs (From admission, onward)     Start     Ordered   05/12/24 0500  CBC with Differential/Platelet  Tomorrow morning,   R       Question:  Specimen collection method  Answer:  Lab=Lab collect   05/11/24 1326            Signed, Chapman Rota, MD [facility] 05/18/2024  "

## 2024-05-18 NOTE — TOC Progression Note (Addendum)
 Transition of Care [facility]) - Progression Note    Patient Details  Name: Sonya Solomon MRN: [id number] Date of Birth: [birth year]  [facility] CM/SW Contact  Sonda Manuella Quill, RN Phone Number: 05/18/2024, 3:09 PM  Clinical Narrative:    Beatris w./ pt in room to discuss d/c plan; pt said she would like to go to short-term rehab; explained new PT eval needed; last PT noted dated 05/08/24; also explained SNF process; pt said she does not have facility preference, but she needs one that is accessible to her husband because he does not drive; Dr Arlice notified via secure chat; IP CM is following.   Expected Discharge Plan:  (TBD) Barriers to Discharge: Continued Medical Work up               Expected Discharge Plan and Services In-house Referral: NA Discharge Planning Services: CM Consult Post Acute Care Choice: Durable Medical Equipment Living arrangements for the past 2 months: Single Family Home                                       Social Drivers of Health (SDOH) Interventions SDOH Screenings   Food Insecurity: No Food Insecurity (05/01/2024)  Housing: Low Risk (05/01/2024)  Transportation Needs: No Transportation Needs (05/01/2024)  Utilities: Not At Risk (05/01/2024)  Social Connections: Moderately Integrated (05/01/2024)  Tobacco Use: Low Risk (05/09/2024)    Readmission Risk Interventions    05/03/2024    4:12 PM  Readmission Risk Prevention Plan  Transportation Screening Complete  PCP or Specialist Appt within 5-7 Days Complete  Home Care Screening Complete  Medication Review (RN CM) Complete

## 2024-05-18 NOTE — Progress Notes (Signed)
 The proposed treatment discussed in conference is for discussion purpose only and is not a binding recommendation.  The patients have not been physically examined, or presented with their treatment options.  Therefore, final treatment plans cannot be decided.

## 2024-05-18 NOTE — Plan of Care (Signed)
   Problem: Coping: Goal: Ability to adjust to condition or change in health will improve Outcome: Progressing   Problem: Fluid Volume: Goal: Ability to maintain a balanced intake and output will improve Outcome: Progressing   Problem: Health Behavior/Discharge Planning: Goal: Ability to identify and utilize available resources and services will improve Outcome: Progressing

## 2024-05-19 ENCOUNTER — Other Ambulatory Visit: Payer: Self-pay

## 2024-05-19 ENCOUNTER — Ambulatory Visit

## 2024-05-19 DIAGNOSIS — R911 Solitary pulmonary nodule: Secondary | ICD-10-CM | POA: Diagnosis not present

## 2024-05-19 LAB — RAD ONC ARIA SESSION SUMMARY
Course Elapsed Days: 4
Plan Fractions Treated to Date: 5
Plan Prescribed Dose Per Fraction: 3 Gy
Plan Total Fractions Prescribed: 10
Plan Total Prescribed Dose: 30 Gy
Reference Point Dosage Given to Date: 15 Gy
Reference Point Session Dosage Given: 3 Gy
Session Number: 5

## 2024-05-19 LAB — GLUCOSE, CAPILLARY
Glucose-Capillary: 177 mg/dL — ABNORMAL HIGH (ref 70–99)
Glucose-Capillary: 223 mg/dL — ABNORMAL HIGH (ref 70–99)
Glucose-Capillary: 141 mg/dL — ABNORMAL HIGH (ref 70–99)
Glucose-Capillary: 166 mg/dL — ABNORMAL HIGH (ref 70–99)

## 2024-05-19 NOTE — Plan of Care (Signed)
" °  Problem: Health Behavior/Discharge Planning: Goal: Ability to manage health-related needs will improve Outcome: Progressing   Problem: Nutritional: Goal: Progress toward achieving an optimal weight will improve Outcome: Progressing   Problem: Skin Integrity: Goal: Risk for impaired skin integrity will decrease Outcome: Progressing   Problem: Education: Goal: Knowledge of General Education information will improve Description: Including pain rating scale, medication(s)/side effects and non-pharmacologic comfort measures Outcome: Progressing   Problem: Clinical Measurements: Goal: Ability to maintain clinical measurements within normal limits will improve Outcome: Progressing   "

## 2024-05-19 NOTE — Progress Notes (Addendum)
 " PROGRESS NOTE  Sonya Solomon  DOB: 22-Nov-1945  PCP: Leonel Cole, MD FMW:[id number]  DOA: 04/30/2024  LOS: 19 days  Hospital Day: 20  Subjective: Patient was seen and examined this morning. Lying down in bed.  Not in distress. Family is at bedside Afebrile, hemodynamically stable, on 2 L oxygen Blood sugar level has been running close to 200.  Brief narrative: Sonya Solomon is a 79 y.o. female with PMH significant for DM2, HTN, HLD, CKD 3A, obesity, MS, gait disturbance, vertigo, urinary urgency, spasticity, insomnia Lives at home with her husband. At baseline, walks with a walker and cane and independent with ADLs  12/28, patient presented to ED after a fall, diarrhea She was found to be very dehydrated with creatinine significantly elevated to 5.4 CT abdomen pelvis showed pancolitis Admitted to [facility] course was complicated by new diagnosis of brain mets from lung primary. Currently getting radiation treatment, planned till 05/26/2024 She has been essentially bedbound because of her weakness and refusal to participate with physical therapy.  She has been told that she will close the chance of SNF approval if she continues to refuse to work with PT.   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  CT head was obtained which showed multiple intracranial lesions suggestive of metastatic disease with vasogenic edema, local mass effect Follow-up MRI brain was obtained which confirmed multiple enhancing nodules within the cerebral cerebellar hemisphere bilaterally suspicious for metastatic disease. 1/4 CT chest showed 18 x 14 mm irregular nodular density in the right middle lobe.  Seen by pulmonary 1/6, underwent flexible video fiberoptic bronchoscopy with robotic assistance and biopsies.  Biopsy report showed adenocarcinoma of lung. Medical oncology, radiation oncology and palliative care  consulted. 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 Currently on Keppra  750 mg twice daily and Decadron  4 mg every 8 hours,  Acute pancolitis Initially presented with diarrhea dehydration CT scan on admission showed pancolitis Diarrhea improved Currently on bowel regimen with senna and MiraLAX    Hypotension H/o hypertension Hypotension resolved with fluids, midodrine  and Solu-Cortef  Lisinopril  on hold Currently also on DDAVP  0.1 mg nightly which is her home dose.  AKI on CKD 3A -resolved Hyperkalemia -resolved Severe hypomagnesemia Hyponatremia Baseline creatinine 1.1.  Presented with creatinine elevated to 5.4 due to diarrhea dehydration.  With IV hydration, creatinine significant improved now.  Renal ultrasound without any obstruction. Potassium level was elevated as high as 5.8 on 1/9.  Improved to normal with 1 dose of Lokelma   1/13, magnesium  level is significantly low at less than 0.5.  Improved with replacement Sodium level remains mildly low because of poor intake Recent Labs  Lab 05/13/24 1014 05/14/24 0549 05/16/24 0647 05/17/24 0601  NA 134* 135 132* 132*  K 4.5 4.9 5.1 4.9  CL 98 100 96* 97*  CO2 23 23 29 25   GLUCOSE 216* 158* 126* 201*  BUN 32* 28* 31* 41*  CREATININE 0.89 0.86 0.97 1.04*  CALCIUM 8.7* 8.5* 8.7* 8.5*  MG  --   --  <0.5* 2.4   E. coli UTI, POA  completed treatment with Rocephin .   Acute urinary retention Foley catheter  was inserted in the ED. because of profound weakness and immobility, Foley catheter was continued. She had leakage with Foley catheter on 1/13 and it was removed.  Started on on PureWick.    Demand ischemia with NSVT  SVT- Troponin trend relatively flat 126, 141, 169 Echocardiogram negative for regional wall motion abnormality, EF 70 to 75%, grade 1 diastolic dysfunction, elevated pulmonary artery systolic pressure, LV hypertrophy severe?     Cardiology recommend outpatient follow-up  Seizure disorder Continue Keppra     Multiple sclerosis followed by Dr. Vear neurology as an outpatient.   Seen by PT OT earlier in the course and recommended SNF. Reports that 3 weeks prior to presentation, she was independent.  Gradually declining physically.  Worsening in the hospital especially because she strongly refuses to participate with PT.  I have told her and her husband that her likelihood of getting into a rehab/SNF is significantly low if she continues to refuse PT.    Type 2 diabetes mellitus  Hyperglycemia due to steroids A1c controlled at 7 on 04/30/2024 PTA meds-metformin , Actos , Ozempic  Currently on Lantus  15 units daily, scheduled Premeal aspart 6 units 3 times daily, SSI with Accu-Cheks.   Recent Labs  Lab 05/18/24 1139 05/18/24 1642 05/18/24 2104 05/19/24 0748 05/19/24 1158  GLUCAP 218* 169* 180* 177* 223*   Hyperlipidemia  continue Zocor   Anxiety/depression Xanax  0.25 mg 3 times daily as needed    Nutrition Status:         Mobility:   PT Orders:   PT Follow up Rec: Skilled Nursing-Short Term Rehab (<3 Hours/Day)05/05/2024 1211    Goals of care   Code Status: Full Code  Palliative care following.    DVT prophylaxis:  Place and maintain sequential compression device Start: 05/10/24 1520   Antimicrobials: None currently Fluid: None Consultants: Oncology, palliative care Family Communication: husband at bedside.    Status: Inpatient Level of care:  Med-Surg   Patient is from: Home Needs to continue in-hospital care: Radiation treatment to continue through 1/23. Anticipated d/c to: SNF recommended by PT.  Needs a lot of encouragement to participate with PT   Diet:   Diet Order             Diet Carb Modified Room service appropriate? Yes  Diet effective now                   Scheduled Meds:  acetaminophen   1,000 mg Oral TID   desmopressin   0.1 mg Oral QHS   dexamethasone   4 mg Oral Q8H   feeding supplement  237 mL Oral BID BM   insulin  aspart  0-5 Units Subcutaneous QHS   insulin  aspart  0-6 Units Subcutaneous TID WC   insulin  aspart  6 Units Subcutaneous TID WC   insulin  glargine-yfgn  15 Units Subcutaneous Daily   levETIRAcetam   750 mg Oral BID   pantoprazole   40 mg Oral Daily   polyethylene glycol  17 g Oral BID   simvastatin   40 mg Oral q1800    PRN meds: ALPRAZolam , docusate sodium , hydrALAZINE , HYDROmorphone  (DILAUDID ) injection, LORazepam , melatonin, mouth rinse, oxyCODONE , prochlorperazine    Infusions:    Antimicrobials: Anti-infectives (From admission, onward)    Start     Dose/Rate Route Frequency Ordered Stop   05/01/24 2200  cefTRIAXone  (ROCEPHIN ) 1 g in sodium chloride  0.9 % 100 mL IVPB        1 g 200 mL/hr over 30 Minutes Intravenous Every 24 hours 05/01/24 0439 05/06/24 0751  04/30/24 2300  cefTRIAXone  (ROCEPHIN ) 1 g in sodium chloride  0.9 % 100 mL IVPB        1 g 200 mL/hr over 30 Minutes Intravenous  Once 04/30/24 2255 04/30/24 2329       Objective: Vitals:   05/18/24 1513 05/19/24 0417  BP: 125/67 121/72  Pulse: (!) 55 (!) 51  Resp: 19 19  Temp: 97.6 F (36.4 C) (!) 97.5 F (36.4 C)  SpO2:      Intake/Output Summary (Last 24 hours) at 05/19/2024 1308 Last data filed at 05/18/2024 2300 Gross per 24 hour  Intake 240 ml  Output 450 ml  Net -210 ml    Filed Weights   04/30/24 2001 04/30/24 2015  Weight: 89.6 kg 89.6 kg   Weight change:  Body mass index is 33.38 kg/m.   Physical Exam: General exam:  elderly Caucasian female.  Not in distress.   Skin: No rashes, lesions or ulcers. HEENT: Atraumatic, normocephalic, no obvious bleeding Lungs: Clear to auscultation bilaterally,  CVS: S1,  S2, no murmur,   GI/Abd: Soft, nontender, nondistended, bowel sound present,   CNS: Sleeping.  Opens eyes on command. Extremities: No pedal edema, no calf tenderness,   Data Review: I have personally reviewed the laboratory data and studies available.  F/u labs ordered Unresulted Labs (From admission, onward)     Start     Ordered   05/20/24 0500  CBC with Differential/Platelet  Tomorrow morning,   R       Question:  Specimen collection method  Answer:  Lab=Lab collect   05/19/24 0916   05/20/24 0500  Basic metabolic panel with GFR  Tomorrow morning,   R       Question:  Specimen collection method  Answer:  Lab=Lab collect   05/19/24 0916   05/12/24 0500  CBC with Differential/Platelet  Tomorrow morning,   R       Question:  Specimen collection method  Answer:  Lab=Lab collect   05/11/24 1326            Signed, Chapman Rota, MD [facility] 05/19/2024  "

## 2024-05-19 NOTE — Care Management Important Message (Signed)
 Important Message  Patient Details IM Letter given. Name: [name] MRN: [id number] Date of Birth: [birth year]   Important Message Given:  Yes - Medicare IM     [name] 05/19/2024, 2:59 PM

## 2024-05-20 DIAGNOSIS — R197 Diarrhea, unspecified: Secondary | ICD-10-CM

## 2024-05-20 DIAGNOSIS — G35D Multiple sclerosis, unspecified: Secondary | ICD-10-CM | POA: Diagnosis not present

## 2024-05-20 DIAGNOSIS — I10 Essential (primary) hypertension: Secondary | ICD-10-CM | POA: Diagnosis not present

## 2024-05-20 DIAGNOSIS — I2489 Other forms of acute ischemic heart disease: Secondary | ICD-10-CM

## 2024-05-20 DIAGNOSIS — E1169 Type 2 diabetes mellitus with other specified complication: Secondary | ICD-10-CM | POA: Diagnosis not present

## 2024-05-20 DIAGNOSIS — E875 Hyperkalemia: Secondary | ICD-10-CM

## 2024-05-20 DIAGNOSIS — R569 Unspecified convulsions: Secondary | ICD-10-CM | POA: Diagnosis not present

## 2024-05-20 DIAGNOSIS — N1831 Chronic kidney disease, stage 3a: Secondary | ICD-10-CM | POA: Diagnosis not present

## 2024-05-20 DIAGNOSIS — R911 Solitary pulmonary nodule: Secondary | ICD-10-CM | POA: Diagnosis not present

## 2024-05-20 DIAGNOSIS — N179 Acute kidney failure, unspecified: Secondary | ICD-10-CM | POA: Diagnosis not present

## 2024-05-20 DIAGNOSIS — E785 Hyperlipidemia, unspecified: Secondary | ICD-10-CM | POA: Diagnosis not present

## 2024-05-20 LAB — CBC WITH DIFFERENTIAL/PLATELET
Abs Immature Granulocytes: 0.09 K/uL — ABNORMAL HIGH (ref 0.00–0.07)
Basophils Absolute: 0 K/uL (ref 0.0–0.1)
Basophils Relative: 0 %
Eosinophils Absolute: 0 K/uL (ref 0.0–0.5)
Eosinophils Relative: 0 %
HCT: 38.2 % (ref 36.0–46.0)
Hemoglobin: 12.8 g/dL (ref 12.0–15.0)
Immature Granulocytes: 1 %
Lymphocytes Relative: 8 %
Lymphs Abs: 0.7 K/uL (ref 0.7–4.0)
MCH: 31.2 pg (ref 26.0–34.0)
MCHC: 33.5 g/dL (ref 30.0–36.0)
MCV: 93.2 fL (ref 80.0–100.0)
Monocytes Absolute: 0.5 K/uL (ref 0.1–1.0)
Monocytes Relative: 6 %
Neutro Abs: 7.6 K/uL (ref 1.7–7.7)
Neutrophils Relative %: 85 %
Platelets: 250 K/uL (ref 150–400)
RBC: 4.1 MIL/uL (ref 3.87–5.11)
RDW: 15 % (ref 11.5–15.5)
WBC: 8.9 K/uL (ref 4.0–10.5)
nRBC: 0 % (ref 0.0–0.2)

## 2024-05-20 LAB — GLUCOSE, CAPILLARY
Glucose-Capillary: 145 mg/dL — ABNORMAL HIGH (ref 70–99)
Glucose-Capillary: 218 mg/dL — ABNORMAL HIGH (ref 70–99)
Glucose-Capillary: 139 mg/dL — ABNORMAL HIGH (ref 70–99)
Glucose-Capillary: 198 mg/dL — ABNORMAL HIGH (ref 70–99)

## 2024-05-20 LAB — BASIC METABOLIC PANEL WITH GFR
Anion gap: 8 (ref 5–15)
BUN: 33 mg/dL — ABNORMAL HIGH (ref 8–23)
CO2: 27 mmol/L (ref 22–32)
Calcium: 8.9 mg/dL (ref 8.9–10.3)
Chloride: 97 mmol/L — ABNORMAL LOW (ref 98–111)
Creatinine, Ser: 0.86 mg/dL (ref 0.44–1.00)
GFR, Estimated: >60 mL/min
Glucose, Bld: 144 mg/dL — ABNORMAL HIGH (ref 70–99)
Potassium: 5.3 mmol/L — ABNORMAL HIGH (ref 3.5–5.1)
Sodium: 132 mmol/L — ABNORMAL LOW (ref 135–145)

## 2024-05-20 MED ORDER — SODIUM ZIRCONIUM CYCLOSILICATE 10 G PO PACK
10.0000 g | PACK | Freq: Every day | ORAL | Status: DC
  Administered 2024-05-20 – 2024-05-21 (×2): 10 g via ORAL
  Filled 2024-05-20 (×2): qty 1

## 2024-05-20 NOTE — Progress Notes (Signed)
 This RN had conversation with patient about her back pain as she asked what it could be from and why I wasn't examining her, explained it could be from constipation and immobilization but diagnosing is out of my scope of practice. Attempt to encourage patient to ambulate. Patient got very mad, states my leg is paralyzed from the cancer.  NT present during conversation.

## 2024-05-20 NOTE — Plan of Care (Signed)
" °  Problem: Education: Goal: Ability to describe self-care measures that may prevent or decrease complications (Diabetes Survival Skills Education) will improve Outcome: Not Progressing Goal: Individualized Educational Video(s) Outcome: Not Progressing   Problem: Coping: Goal: Ability to adjust to condition or change in health will improve Outcome: Not Progressing   Problem: Fluid Volume: Goal: Ability to maintain a balanced intake and output will improve Outcome: Not Progressing   Problem: Health Behavior/Discharge Planning: Goal: Ability to identify and utilize available resources and services will improve Outcome: Not Progressing Goal: Ability to manage health-related needs will improve Outcome: Not Progressing   Problem: Metabolic: Goal: Ability to maintain appropriate glucose levels will improve Outcome: Not Progressing   Problem: Nutritional: Goal: Maintenance of adequate nutrition will improve Outcome: Not Progressing Goal: Progress toward achieving an optimal weight will improve Outcome: Not Progressing   Problem: Skin Integrity: Goal: Risk for impaired skin integrity will decrease Outcome: Not Progressing   Problem: Tissue Perfusion: Goal: Adequacy of tissue perfusion will improve Outcome: Not Progressing   Problem: Education: Goal: Knowledge of General Education information will improve Description: Including pain rating scale, medication(s)/side effects and non-pharmacologic comfort measures Outcome: Not Progressing   Problem: Health Behavior/Discharge Planning: Goal: Ability to manage health-related needs will improve Outcome: Not Progressing   Problem: Clinical Measurements: Goal: Ability to maintain clinical measurements within normal limits will improve Outcome: Not Progressing Goal: Will remain free from infection Outcome: Not Progressing Goal: Diagnostic test results will improve Outcome: Not Progressing Goal: Respiratory complications will  improve Outcome: Not Progressing Goal: Cardiovascular complication will be avoided Outcome: Not Progressing   Problem: Activity: Goal: Risk for activity intolerance will decrease Outcome: Not Progressing   Problem: Nutrition: Goal: Adequate nutrition will be maintained Outcome: Not Progressing   Problem: Coping: Goal: Level of anxiety will decrease Outcome: Not Progressing   Problem: Elimination: Goal: Will not experience complications related to bowel motility Outcome: Not Progressing Goal: Will not experience complications related to urinary retention Outcome: Not Progressing   Problem: Pain Managment: Goal: General experience of comfort will improve and/or be controlled Outcome: Not Progressing   Problem: Safety: Goal: Ability to remain free from injury will improve Outcome: Not Progressing   Problem: Skin Integrity: Goal: Risk for impaired skin integrity will decrease Outcome: Not Progressing   Problem: Education: Goal: Knowledge of the prescribed therapeutic regimen will improve Outcome: Not Progressing   Problem: Bowel/Gastric: Goal: Gastrointestinal status for postoperative course will improve Outcome: Not Progressing   Problem: Cardiac: Goal: Ability to maintain an adequate cardiac output Outcome: Not Progressing Goal: Will show no evidence of cardiac arrhythmias Outcome: Not Progressing   Problem: Nutritional: Goal: Will attain and maintain optimal nutritional status Outcome: Not Progressing   Problem: Neurological: Goal: Will regain or maintain usual level of consciousness Outcome: Not Progressing   Problem: Clinical Measurements: Goal: Ability to maintain clinical measurements within normal limits Outcome: Not Progressing Goal: Postoperative complications will be avoided or minimized Outcome: Not Progressing   Problem: Respiratory: Goal: Will regain and/or maintain adequate ventilation Outcome: Not Progressing Goal: Respiratory status will  improve Outcome: Not Progressing   Problem: Skin Integrity: Goal: Demonstrates signs of wound healing without infection Outcome: Not Progressing   Problem: Urinary Elimination: Goal: Will remain free from infection Outcome: Not Progressing Goal: Ability to achieve and maintain adequate urine output Outcome: Not Progressing   "

## 2024-05-20 NOTE — Plan of Care (Signed)
   Problem: Coping: Goal: Ability to adjust to condition or change in health will improve Outcome: Progressing   Problem: Fluid Volume: Goal: Ability to maintain a balanced intake and output will improve Outcome: Progressing   Problem: Health Behavior/Discharge Planning: Goal: Ability to manage health-related needs will improve Outcome: Progressing   Problem: Nutritional: Goal: Maintenance of adequate nutrition will improve Outcome: Progressing

## 2024-05-20 NOTE — Progress Notes (Signed)
 " PROGRESS NOTE  Sonya Solomon  DOB: July 04, 1945  PCP: Leonel Cole, MD FMW:[id number]  DOA: 04/30/2024  LOS: 20 days  Hospital Day: 21   Brief narrative: Sonya Solomon is a 79 y.o. female with PMH significant for DM2, HTN, HLD, CKD 3A, obesity, MS, gait disturbance, vertigo, urinary urgency, spasticity, insomnia Lives at home with her husband. At baseline, walks with a walker and cane and independent with ADLs  12/28, patient presented to ED after a fall, diarrhea She was found to be very dehydrated with creatinine significantly elevated to 5.4 CT abdomen pelvis showed pancolitis Admitted to [facility] course was complicated by new diagnosis of brain mets from lung primary. Currently getting radiation treatment, planned till 05/26/2024 She has been essentially bedbound because of her weakness and refusal to participate with physical therapy.  She has been told that she will close the chance of SNF approval if she continues to refuse to work with PT.   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  CT head was obtained which showed multiple intracranial lesions suggestive of metastatic disease with vasogenic edema, local mass effect Follow-up MRI brain was obtained which confirmed multiple enhancing nodules within the cerebral cerebellar hemisphere bilaterally suspicious for metastatic disease. 1/4 CT chest showed 18 x 14 mm irregular nodular density in the right middle lobe.  Seen by pulmonary 1/6, underwent flexible video fiberoptic bronchoscopy with robotic assistance and biopsies.  Biopsy report showed adenocarcinoma of lung. Medical oncology, radiation oncology and palliative care consulted. 1/10, MRI brain showed the same findings as previous MRI from 1/3 with no new lesions 1/10, MRI total spine did not show any evidence of metastasis but showed a minimally increased in size 12 mm enhancing intradural,  extramedullary mass at T11, possibly meningioma, compared to MRI from 02/17/2024 1/12, started on radiation treatment, to last till 1/23. Currently on Keppra  750 mg twice daily and Decadron  4 mg every 8 hours,  Acute pancolitis Initially presented with diarrhea dehydration CT scan on admission showed pancolitis Diarrhea improved Currently on bowel regimen with senna and MiraLAX    Hypotension H/o hypertension Hypotension resolved with fluids, midodrine  and Solu-Cortef  Lisinopril  on hold.  Blood pressure currently within goal. Currently also on DDAVP  0.1 mg nightly which is her home dose.  AKI on CKD 3A -resolved Baseline creatinine 1.1.  Presented with creatinine elevated to 5.4 due to diarrhea dehydration.  Renal ultrasound without any obstruction. Creatinine now back to baseline with IV fluid. Monitor renal function Avoid nephrotoxins  Hyperkalemia , potassium again mildly elevated at 5.3 after improving with a dose of Lokelma  before. - Ordered more Lokelma  -Monitor potassium  Severe hypomagnesemia 1/13, magnesium  level is significantly low at less than 0.5.  Improved with replacement.  Hyponatremia Sodium level remains mildly, currently stable at 132.  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 - Continue to monitor  E. coli UTI, POA  completed treatment with Rocephin .   Acute urinary retention Foley catheter was inserted in the ED. because of profound weakness and immobility, Foley catheter was continued. She had leakage with Foley catheter on 1/13 and it was removed.  Started on on PureWick.    Demand ischemia with NSVT SVT- Troponin trend relatively flat 126, 141, 169 Echocardiogram negative for regional wall motion abnormality, EF 70 to 75%, grade 1 diastolic dysfunction, elevated pulmonary artery systolic pressure, LV hypertrophy severe?     Cardiology  recommend outpatient follow-up  Seizure disorder Continue Keppra      Multiple sclerosis followed by Dr. Vear neurology as an outpatient.   Seen by PT OT earlier in the course and recommended SNF. Reports that 3 weeks prior to presentation, she was independent.  Gradually declining physically.   -PT is recommending SNF -Encouraged to work with PT    Type 2 diabetes mellitus  Hyperglycemia due to steroids A1c controlled at 7 on 04/30/2024 PTA meds-metformin , Actos , Ozempic  Currently on Lantus  15 units daily, scheduled Premeal aspart 6 units 3 times daily, SSI with Accu-Cheks.    Hyperlipidemia  continue Zocor   Anxiety/depression Xanax  0.25 mg 3 times daily as needed    Mobility:   PT Orders: Continue working with PT  PT Follow up Rec: Skilled Nursing-Short Term Rehab (<3 Hours/Day)05/05/2024 1211    Goals of care   Code Status: Full Code  Palliative care following.    DVT prophylaxis:  Place and maintain sequential compression device Start: 05/10/24 1520   Antimicrobials: None currently Fluid: None Consultants: Oncology, palliative care Family Communication: Talked with husband on phone. Status: Inpatient Level of care:  Med-Surg   Patient is from: Home Needs to continue in-hospital care: Radiation treatment to continue through 1/23. Anticipated d/c to: SNF recommended by PT.  Needs a lot of encouragement to participate with PT   Diet:  Diet Order             Diet Carb Modified Room service appropriate? Yes  Diet effective now                   Scheduled Meds:  acetaminophen   1,000 mg Oral TID   desmopressin   0.1 mg Oral QHS   dexamethasone   4 mg Oral Q8H   feeding supplement  237 mL Oral BID BM   insulin  aspart  0-5 Units Subcutaneous QHS   insulin  aspart  0-6 Units Subcutaneous TID WC   insulin  aspart  6 Units Subcutaneous TID WC   insulin  glargine-yfgn  15 Units Subcutaneous Daily   levETIRAcetam   750 mg Oral BID   pantoprazole   40 mg Oral Daily   polyethylene glycol  17 g Oral BID   simvastatin   40 mg Oral  q1800   sodium zirconium cyclosilicate   10 g Oral Daily    PRN meds: ALPRAZolam , docusate sodium , hydrALAZINE , HYDROmorphone  (DILAUDID ) injection, LORazepam , melatonin, mouth rinse, oxyCODONE , prochlorperazine    Infusions:    Antimicrobials: Anti-infectives (From admission, onward)    Start     Dose/Rate Route Frequency Ordered Stop   05/01/24 2200  cefTRIAXone  (ROCEPHIN ) 1 g in sodium chloride  0.9 % 100 mL IVPB        1 g 200 mL/hr over 30 Minutes Intravenous Every 24 hours 05/01/24 0439 05/06/24 0751   04/30/24 2300  cefTRIAXone  (ROCEPHIN ) 1 g in sodium chloride  0.9 % 100 mL IVPB        1 g 200 mL/hr over 30 Minutes Intravenous  Once 04/30/24 2255 04/30/24 2329       Objective: Vitals:   05/20/24 0439 05/20/24 1309  BP: (!) 148/69 116/63  Pulse: (!) 46 (!) 50  Resp: 16 16  Temp: 98.2 F (36.8 C) (!) 97.4 F (36.3 C)  SpO2: 100%     Intake/Output Summary (Last 24 hours) at 05/20/2024 1351 Last data filed at 05/20/2024 0444 Gross per 24 hour  Intake --  Output 850 ml  Net -850 ml    Filed Weights   04/30/24 2001 04/30/24 2015  Weight: 89.6 kg 89.6 kg   Weight change:  Body mass index is 33.38 kg/m.   Physical Exam: General.  Frail elderly lady, in no acute distress. Pulmonary.  Lungs clear bilaterally, normal respiratory effort. CV.  Regular rate and rhythm, no JVD, rub or murmur. Abdomen.  Soft, nontender, nondistended, BS positive. CNS.  Alert and oriented .  No focal neurologic deficit. Extremities.  No edema, no cyanosis, pulses intact and symmetrical.  Data Review: I have personally reviewed the laboratory data and studies available.  F/u labs ordered Unresulted Labs (From admission, onward)     Start     Ordered   05/12/24 0500  CBC with Differential/Platelet  Tomorrow morning,   R       Question:  Specimen collection method  Answer:  Lab=Lab collect   05/11/24 1326           This record has been created using Dragon voice recognition  software. Errors have been sought and corrected,but may not always be located. Such creation errors do not reflect on the standard of care.   Signed, Amaryllis Dare, MD [facility] 05/20/2024  "

## 2024-05-21 DIAGNOSIS — E1169 Type 2 diabetes mellitus with other specified complication: Secondary | ICD-10-CM | POA: Diagnosis not present

## 2024-05-21 DIAGNOSIS — I10 Essential (primary) hypertension: Secondary | ICD-10-CM | POA: Diagnosis not present

## 2024-05-21 DIAGNOSIS — R569 Unspecified convulsions: Secondary | ICD-10-CM | POA: Diagnosis not present

## 2024-05-21 DIAGNOSIS — E875 Hyperkalemia: Secondary | ICD-10-CM | POA: Diagnosis not present

## 2024-05-21 DIAGNOSIS — N179 Acute kidney failure, unspecified: Secondary | ICD-10-CM | POA: Diagnosis not present

## 2024-05-21 DIAGNOSIS — R197 Diarrhea, unspecified: Secondary | ICD-10-CM | POA: Diagnosis not present

## 2024-05-21 DIAGNOSIS — I2489 Other forms of acute ischemic heart disease: Secondary | ICD-10-CM | POA: Diagnosis not present

## 2024-05-21 DIAGNOSIS — G35D Multiple sclerosis, unspecified: Secondary | ICD-10-CM | POA: Diagnosis not present

## 2024-05-21 DIAGNOSIS — E785 Hyperlipidemia, unspecified: Secondary | ICD-10-CM | POA: Diagnosis not present

## 2024-05-21 DIAGNOSIS — N1831 Chronic kidney disease, stage 3a: Secondary | ICD-10-CM | POA: Diagnosis not present

## 2024-05-21 DIAGNOSIS — R911 Solitary pulmonary nodule: Secondary | ICD-10-CM | POA: Diagnosis not present

## 2024-05-21 LAB — GLUCOSE, CAPILLARY
Glucose-Capillary: 202 mg/dL — ABNORMAL HIGH (ref 70–99)
Glucose-Capillary: 234 mg/dL — ABNORMAL HIGH (ref 70–99)
Glucose-Capillary: 212 mg/dL — ABNORMAL HIGH (ref 70–99)
Glucose-Capillary: 136 mg/dL — ABNORMAL HIGH (ref 70–99)

## 2024-05-21 LAB — RENAL FUNCTION PANEL
Albumin: 3.2 g/dL — ABNORMAL LOW (ref 3.5–5.0)
Anion gap: 8 (ref 5–15)
BUN: 33 mg/dL — ABNORMAL HIGH (ref 8–23)
CO2: 25 mmol/L (ref 22–32)
Calcium: 8.8 mg/dL — ABNORMAL LOW (ref 8.9–10.3)
Chloride: 94 mmol/L — ABNORMAL LOW (ref 98–111)
Creatinine, Ser: 0.8 mg/dL (ref 0.44–1.00)
GFR, Estimated: >60 mL/min
Glucose, Bld: 216 mg/dL — ABNORMAL HIGH (ref 70–99)
Phosphorus: 3.3 mg/dL (ref 2.5–4.6)
Potassium: 4.8 mmol/L (ref 3.5–5.1)
Sodium: 127 mmol/L — ABNORMAL LOW (ref 135–145)

## 2024-05-21 LAB — MAGNESIUM: Magnesium: 1.9 mg/dL (ref 1.7–2.4)

## 2024-05-21 MED ORDER — SODIUM CHLORIDE 1 G PO TABS
1.0000 g | ORAL_TABLET | Freq: Two times a day (BID) | ORAL | Status: DC
  Administered 2024-05-21 – 2024-05-26 (×11): 1 g via ORAL
  Filled 2024-05-21 (×10): qty 1

## 2024-05-21 NOTE — Progress Notes (Signed)
 Occupational Therapy Re-evaluation Patient Details Name: Sonya Solomon MRN: [id number] DOB: [birth year] Today's Date: 05/21/2024   History of Present Illness   [name] is a 79 yr old female who presented to the ED on 04/30/24 with diarrhea and after a fall. She was found to be very dehydrated with creatinine significantly elevated to 5.4  CT abdomen pelvis showed pancolitis.  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  Currently getting radiation treatment, planned till 05/26/2024. PMH significant for DM2, HTN, HLD, CKD 3A, obesity, MS, gait disturbance, vertigo, urinary urgency, spasticity, insomnia     Clinical Impressions OT re-eval completed this date, as new OT orders were placed, due to the pt now being willing & able to tolerate active participation in the therapy process. The pt is currently limited by the below listed deficits (see OT problem list). As such, her ADL performance is compromised and she is requiring max assist for toileting management and total assist for lower body dressing. During the session today, she also required mod-max assist for supine to sit and scooting to the edge of the bed, and total assist for lateral scooting towards the head of the bed. She has baseline R sided weakness as a result of MS, with the weakness being notably more pronounced in her RLE. She also reported having significant numbness of her RLE. She will benefit from further OT services to maximize her independence with ADLs & to decrease the risk for restricted participation in meaningful activities. Patient will benefit from continued inpatient follow up therapy, <3 hours/day.      If plan is discharge home, recommend the following:   A lot of help with walking and/or transfers;A lot of help with bathing/dressing/bathroom;Help with stairs or ramp for entrance;Assistance with cooking/housework;Assist for transportation     Functional Status  Assessment   Patient has had a recent decline in their functional status and demonstrates the ability to make significant improvements in function in a reasonable and predictable amount of time.     Equipment Recommendations   Other (comment) (defer to next setting)     Recommendations for Other Services         Precautions/Restrictions   Precautions Precautions: Fall Restrictions Other Position/Activity Restrictions: baseline R sided weakness, more pronounced in leg     Mobility Bed Mobility Overal bed mobility: Needs Assistance       Supine to sit: Mod assist, HOB elevated, Used rails, Max assist (mod assist for supine to sit, max assist for scooting to the edge of the bed) Sit to supine: Mod assist (required assist for BLE back onto bed)   General bed mobility comments:  (Once seated EOB, she required total assist for scooting to the head of the bed)     Balance Overall balance assessment: Needs assistance   Sitting balance-Leahy Scale: Fair            ADL either performed or assessed with clinical judgement   ADL Overall ADL's : Needs assistance/impaired Eating/Feeding: Set up;Bed level   Grooming: Contact guard assist;Sitting Grooming Details (indicate cue type and reason): simulated seated EOB Upper Body Bathing: Contact guard assist;Sitting   Lower Body Bathing: Maximal assistance;Sitting/lateral leans   Upper Body Dressing : Minimal assistance;Sitting Upper Body Dressing Details (indicate cue type and reason): simulated seated EOB Lower Body Dressing: Total assistance;Sitting/lateral leans Lower Body Dressing Details (indicate cue type and reason): assist needed to donn both socks seated EOB     Toileting-  Clothing Manipulation and Hygiene: Maximal assistance;Bed level;Cueing for sequencing;Cueing for safety                Pertinent Vitals/Pain Pain Assessment Pain Assessment: No/denies pain     Extremity/Trunk Assessment Upper  Extremity Assessment Upper Extremity Assessment: Generalized weakness RUE Deficits / Details: Required AAROM for achieve full ROM for shoulder flexion. Elbow and hand AROM WFL. Biceps, triceps and grip strength grossly 4-/5 to 4/5. Baseline RUE weakness from MS. Chronic numbness of her fingers. LUE Deficits / Details: AROM and strength WFL. Gross strength 4/5 to 4+/5   Lower Extremity Assessment RLE Deficits / Details:  (Baseline weakness as a result of MS, required AAROM to PROM, chronic numbess, no functional AROM) RLE Sensation: decreased light touch;decreased proprioception RLE Coordination: decreased gross motor LLE Deficits / Details: AAROM grossly WFL,  MMT at least 3+/5       Communication Communication Factors Affecting Communication: Difficulty expressing self (intermittent difficulty clearing articulating her thoughts)   Cognition Arousal: Alert Behavior During Therapy: Anxious Cognition: Cognition impaired       Memory impairment (select all impairments): Short-term memory, Working civil service fast streamer, Conservation officer, historic buildings Attention impairment (select first level of impairment): Divided attention, Selective attention Executive functioning impairment (select all impairments): Initiation, Sequencing, Organization, Problem solving OT - Cognition Comments: 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       Following commands: Impaired Following commands impaired: Follows one step commands with increased time     Cueing  General Comments   Cueing Techniques: Verbal cues;Tactile cues;Gestural cues              Home Living Family/patient expects to be discharged to:: Private residence Living Arrangements: Spouse/significant other Available Help at Discharge: Family Type of Home: Mobile home Home Access: Stairs to enter Entrance Stairs-Number of Steps: 4 Entrance Stairs-Rails: Left Home Layout: One level     Bathroom Shower/Tub: Walk-in shower         Home  Equipment: Grab bars - tub/shower;Rollator (4 wheels);Rolling Environmental Consultant (2 wheels)      Lives With: Spouse    Prior Functioning/Environment Prior Level of Function : Independent/Modified Independent             Mobility Comments: She used a RW for household ambulation. ADLs Comments: She reported being modified independent to independent with dressing and toileting, requiring increased time for dressing. Her spouse has been assisting with bathing and managing the cooking and cleaning. She does not drive.    OT Problem List: Decreased strength;Decreased range of motion;Impaired balance (sitting and/or standing);Decreased coordination;Decreased knowledge of use of DME or AE;Impaired sensation;Decreased knowledge of precautions;Decreased cognition   OT Treatment/Interventions: Self-care/ADL training;Therapeutic exercise;Neuromuscular education;Energy conservation;DME and/or AE instruction;Therapeutic activities;Balance training;Patient/family education      OT Goals(Current goals can be found in the care plan section)   Acute Rehab OT Goals Patient Stated Goal: to get stronger and to return home OT Goal Formulation: With patient Time For Goal Achievement: 06/04/24 Potential to Achieve Goals: Good ADL Goals Pt Will Perform Upper Body Bathing: with set-up;with supervision Pt Will Perform Upper Body Dressing: with set-up;with supervision Pt Will Perform Lower Body Dressing: with min assist;with adaptive equipment;sitting/lateral leans Pt Will Transfer to Toilet: with min assist;bedside commode;squat pivot transfer Additional ADL Goal #1: The patient will perform bed mobility with min assist, in prep for progressive ADL participation.   OT Frequency:  Min 2X/week       AM-PAC OT 6 Clicks Daily Activity     Outcome Measure Help  from another person eating meals?: A Little Help from another person taking care of personal grooming?: A Little Help from another person toileting, which  includes using toliet, bedpan, or urinal?: A Lot Help from another person bathing (including washing, rinsing, drying)?: A Lot Help from another person to put on and taking off regular upper body clothing?: A Little Help from another person to put on and taking off regular lower body clothing?: Total 6 Click Score: 14   End of Session Equipment Utilized During Treatment: Other (comment) (N/A) Nurse Communication: Mobility status  Activity Tolerance: Other (comment) (Fair tolerance) Patient left: in bed;with call bell/phone within reach;with bed alarm set;with nursing/sitter in room  OT Visit Diagnosis: Other abnormalities of gait and mobility (R26.89);Muscle weakness (generalized) (M62.81);Other symptoms and signs involving cognitive function                Time: 1700-1721 OT Time Calculation (min): 21 min Charges:  OT General Charges $OT Visit: 1 Visit OT Evaluation $OT Re-eval: 1 Re-eval    [name], OTR/L 05/21/2024, 11:16 PM

## 2024-05-21 NOTE — Progress Notes (Signed)
 OT Cancellation Note  Patient Details Name: Sonya Solomon MRN: [id number] DOB: 16-May-1945   Cancelled Treatment:    Reason Eval/Treat Not Completed: Other (comment). Pt was eating dinner and asked for OT to check back.   [name], OTR/L 05/21/2024, 6:04 PM

## 2024-05-21 NOTE — Plan of Care (Signed)
   Problem: Education: Goal: Knowledge of General Education information will improve Description: Including pain rating scale, medication(s)/side effects and non-pharmacologic comfort measures Outcome: Progressing   Problem: Clinical Measurements: Goal: Ability to maintain clinical measurements within normal limits will improve Outcome: Progressing

## 2024-05-21 NOTE — Evaluation (Signed)
 Physical Therapy Re Evaluation Patient Details Name: Sonya Solomon MRN: [id number] DOB: 1945-10-06 Today's Date: 05/21/2024  History of Present Illness  79 y.o. female who presents to the ER 04/30/24 due to generalized weakness, dizziness, and a fall, hypotension, AKI on CKD. pt ound to have Lung CA with brain mets. pt not amenable to PT, treatments/whole rain radiation  etc earlier during this hospitalization. PT now re-ordered on 05/20/23. pt had first radiation treatment on 05/15/24. pt has been reluctanct to engage with Palliative med Team.   Prior medical history significant for MS, type 2 diabetes, hyperlipidemia, hypertension, obesity, history of gait disturbance, vertigo, urinary urgency, spasticity, insomnia  Clinical Impression  Pt admitted with above diagnosis.  Pt seen for re-eval/PT re-ordered. Pt is agreeable to PT today. Oriented x3, presents today globally deconditioned, with significant RLE weakness, delayed initiation and processing, difficulty sequencing tasks. Multi-modal cues needed throughout session with intermittent hand over hand cues needed as well. Pt is at risk for learned helplessness in addition to current physical and cognitive deficits. Pt requiring ~ max assist for supine <>sit, +2 for safety.  Pt c/o dizziness in sitting, NT d/t pt reporting dizziness in sitting, VSS as follows-- BP 163/87, SpO2-99% on RA, HR 87 Pt was able to self assist scooting up in supine with step by step cues for task sequence and bed in boost mode.  Patient will benefit from continued inpatient follow up therapy, <3 hours/day Continue to follow in acute setting.  Pt currently with functional limitations due to the deficits listed below (see PT Problem List). Pt will benefit from acute skilled PT to increase their independence and safety with mobility to allow discharge.           If plan is discharge home, recommend the following: A lot of help with walking and/or transfers;A lot of help with  bathing/dressing/bathroom;Direct supervision/assist for medications management;Assist for transportation;Assistance with cooking/housework;Help with stairs or ramp for entrance   Can travel by private vehicle   No    Equipment Recommendations None recommended by PT  Recommendations for Other Services       Functional Status Assessment Patient has had a recent decline in their functional status and demonstrates the ability to make significant improvements in function in a reasonable and predictable amount of time.     Precautions / Restrictions Precautions Precautions: Fall Precaution/Restrictions Comments: RLE weakness prior notes state baseline RLE weakness      Mobility  Bed Mobility Overal bed mobility: Needs Assistance Bed Mobility: Rolling Rolling: Mod assist, Used rails   Supine to sit: HOB elevated, Used rails, Max assist, +2 for safety/equipment Sit to supine: Max assist, Mod assist, +2 for safety/equipment   General bed mobility comments: pt able to self assist rolling to L with hand over hand cues for sequence and self assist. physical assist  with RLE and to complete roll. pt required assist to brign LEs off bed, assist to elevat trunk fully and bed pad used to pivot and scoot hips to EOB. incr time and multi-modal cues provided    Transfers                   General transfer comment: NT d/t pt reporting dizziness in sitting, VSS BP 163/87, SpO2-99%, HR 87    Ambulation/Gait                  Careers Information Officer  Tilt Bed    Modified Rankin (Stroke Patients Only)       Balance Overall balance assessment: Needs assistance Sitting-balance support: Single extremity supported, Feet supported Sitting balance-Leahy Scale: Fair Sitting balance - Comments: Fair to poor, with time pt able to  maintain static sit with close supervision/CGA Postural control: Posterior lean     Standing balance comment: unable                              Pertinent Vitals/Pain Pain Assessment Pain Assessment: No/denies pain    Home Living Family/patient expects to be discharged to:: Private residence Living Arrangements: Spouse/significant other Available Help at Discharge: Family;Available 24 hours/day Type of Home: Mobile home Home Access: Stairs to enter Entrance Stairs-Rails: Left Entrance Stairs-Number of Steps: 4   Home Layout: One level Home Equipment: Grab bars - tub/shower;Rollator (4 wheels);Rolling Walker (2 wheels) Additional Comments: uses RW in home. DIfficulty with steps, spouse assists with adls    Prior Function Prior Level of Function : Independent/Modified Independent             Mobility Comments: She used a RW for household ambulation. ADLs Comments: She reported being modified independent to independent with dressing and toileting, requiring increased time for dressing. Her spouse has been assisting with bathing and managing the cooking and cleaning. She does not drive.     Extremity/Trunk Assessment   Upper Extremity Assessment Upper Extremity Assessment: Defer to OT evaluation    Lower Extremity Assessment Lower Extremity Assessment: LLE deficits/detail RLE Deficits / Details: df 0/5, able to passively df ankle to neutral. pf/ 2/5; knee extension/flexion 1/5. hip flexion 1/5 RLE Sensation: decreased light touch;decreased proprioception RLE Coordination: decreased gross motor LLE Deficits / Details: AAROM grossly WFL,  MMT at least 3+/5       Communication   Communication Factors Affecting Communication: Difficulty expressing self (difficulty clearly stating needs, perseverates at times)    Cognition Arousal: Alert Behavior During Therapy: Anxious, Flat affect   PT - Cognitive impairments: Awareness, Problem solving, Safety/Judgement, Memory, No family/caregiver present to determine baseline                       PT - Cognition Comments: oriented  to self, place, date; decr memory of events Following commands: Impaired Following commands impaired: Follows one step commands with increased time     Cueing Cueing Techniques: Verbal cues, Tactile cues, Gestural cues (hand over hand)     General Comments      Exercises     Assessment/Plan    PT Assessment Patient needs continued PT services  PT Problem List Decreased strength;Decreased range of motion;Decreased knowledge of use of DME;Decreased safety awareness;Decreased activity tolerance;Decreased balance;Decreased mobility;Impaired sensation;Decreased coordination;Decreased cognition       PT Treatment Interventions DME instruction;Gait training;Functional mobility training;Therapeutic activities;Therapeutic exercise;Patient/family education;Cognitive remediation;Balance training    PT Goals (Current goals can be found in the Care Plan section)  Acute Rehab PT Goals PT Goal Formulation: With patient Time For Goal Achievement: 06/11/24 Potential to Achieve Goals: Fair    Frequency Min 2X/week     Co-evaluation               AM-PAC PT 6 Clicks Mobility  Outcome Measure Help needed turning from your back to your side while in a flat bed without using bedrails?: A Lot Help needed moving from lying on your back to sitting on the side  of a flat bed without using bedrails?: A Lot Help needed moving to and from a bed to a chair (including a wheelchair)?: Total Help needed standing up from a chair using your arms (e.g., wheelchair or bedside chair)?: Total Help needed to walk in hospital room?: Total Help needed climbing 3-5 steps with a railing? : Total 6 Click Score: 8    End of Session   Activity Tolerance: Patient limited by fatigue;Other (comment) (dizziness) Patient left: in bed;with call bell/phone within reach;with bed alarm set   PT Visit Diagnosis: Unsteadiness on feet (R26.81);Other abnormalities of gait and mobility (R26.89);Repeated falls  (R29.6);Muscle weakness (generalized) (M62.81);History of falling (Z91.81);Difficulty in walking, not elsewhere classified (R26.2);Other symptoms and signs involving the nervous system (R29.898)    Time: 1234 (+1223-1230)-1249 PT Time Calculation (min) (ACUTE ONLY): 15 min   Charges:   PT Evaluation $PT Re-evaluation: 1 Re-eval   PT General Charges $$ ACUTE PT VISIT: 1 Visit         Miosha Behe, PT  Acute Rehab Dept [facility]) [phone]  05/21/2024   [facility] 05/21/2024, 2:17 PM

## 2024-05-21 NOTE — Plan of Care (Signed)

## 2024-05-21 NOTE — Progress Notes (Signed)
 " PROGRESS NOTE  Sonya Solomon  DOB: [birth year]  PCP: Leonel Cole, MD FMW:[id number]  DOA: 04/30/2024  LOS: 21 days  Hospital Day: 22   Brief narrative: Sonya Solomon is a 79 y.o. female with PMH significant for DM2, HTN, HLD, CKD 3A, obesity, MS, gait disturbance, vertigo, urinary urgency, spasticity, insomnia Lives at home with her husband. At baseline, walks with a walker and cane and independent with ADLs  12/28, patient presented to ED after a fall, diarrhea She was found to be very dehydrated with creatinine significantly elevated to 5.4 CT abdomen pelvis showed pancolitis Admitted to [facility] course was complicated by new diagnosis of brain mets from lung primary. Currently getting radiation treatment, planned till 05/26/2024 She has been essentially bedbound because of her weakness and refusal to participate with physical therapy.  She has been told that she will close the chance of SNF approval if she continues to refuse to work with PT.  Subjective: Patient was seen and examined today, stating that I am not feeling well but unable to explain any symptoms.  Denies any pain.   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  CT head was obtained which showed multiple intracranial lesions suggestive of metastatic disease with vasogenic edema, local mass effect Follow-up MRI brain was obtained which confirmed multiple enhancing nodules within the cerebral cerebellar hemisphere bilaterally suspicious for metastatic disease. 1/4 CT chest showed 18 x 14 mm irregular nodular density in the right middle lobe.  Seen by pulmonary 1/6, underwent flexible video fiberoptic bronchoscopy with robotic assistance and biopsies.  Biopsy report showed adenocarcinoma of lung. Medical oncology, radiation oncology and palliative care consulted. 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 Currently on Keppra  750 mg twice daily and Decadron  4 mg every 8 hours,  Acute pancolitis Initially presented with diarrhea dehydration CT scan on admission showed pancolitis Diarrhea improved Currently on bowel regimen with senna and MiraLAX    Hypotension H/o hypertension Hypotension resolved with fluids, midodrine  and Solu-Cortef  Lisinopril  on hold.  Blood pressure currently within goal. Currently also on DDAVP  0.1 mg nightly which is her home dose.  AKI on CKD 3A -resolved Baseline creatinine 1.1.  Presented with creatinine elevated to 5.4 due to diarrhea dehydration.  Renal ultrasound without any obstruction. Creatinine now back to baseline with IV fluid. Monitor renal function Avoid nephrotoxins  Hyperkalemia .  Potassium improved to 4.8. -Continue daily Lokelma  unless potassium goes below 4 -Monitor potassium  Severe hypomagnesemia 1/13, magnesium  level is significantly low at less than 0.5.  Improved with replacement. 1/18: Magnesium  of 1.9  Hyponatremia Further decrease of sodium to 127 today.  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 -Adding some salt tablet - Continue to monitor  E. coli UTI, POA  completed treatment with Rocephin .   Acute urinary retention Foley catheter was inserted in the ED. because of profound weakness and immobility, Foley catheter was continued. She had leakage with Foley catheter on 1/13 and it was removed.  Started on on PureWick.    Demand ischemia with NSVT SVT- Troponin trend relatively flat 126, 141, 169 Echocardiogram  negative for regional wall motion abnormality, EF 70 to 75%, grade 1 diastolic dysfunction, elevated  pulmonary artery systolic pressure, LV hypertrophy severe?     Cardiology recommend outpatient follow-up  Seizure disorder Continue Keppra     Multiple sclerosis followed by Dr. Vear neurology as an outpatient.   Seen by PT OT earlier in the course and recommended SNF. Reports that 3 weeks prior to presentation, she was independent.  Gradually declining physically.   -PT is recommending SNF -Encouraged to work with PT    Type 2 diabetes mellitus  Hyperglycemia due to steroids A1c controlled at 7 on 04/30/2024 PTA meds-metformin , Actos , Ozempic  Currently on Lantus  15 units daily, scheduled Premeal aspart 6 units 3 times daily, SSI with Accu-Cheks.    Hyperlipidemia  continue Zocor   Anxiety/depression Xanax  0.25 mg 3 times daily as needed    Mobility:   PT Orders: Continue working with PT  PT Follow up Rec: Skilled Nursing-Short Term Rehab (<3 Hours/Day)05/05/2024 1211    Goals of care   Code Status: Full Code  Palliative care following.    DVT prophylaxis:  Place and maintain sequential compression device Start: 05/10/24 1520   Antimicrobials: None currently Fluid: None Consultants: Oncology, palliative care Family Communication: Talked with husband on phone. Status: Inpatient Level of care:  Med-Surg   Patient is from: Home Needs to continue in-hospital care: Radiation treatment to continue through 1/23. Anticipated d/c to: SNF recommended by PT.  Needs a lot of encouragement to participate with PT   Diet:  Diet Order             Diet Carb Modified Room service appropriate? Yes  Diet effective now                   Scheduled Meds:  acetaminophen   1,000 mg Oral TID   desmopressin   0.1 mg Oral QHS   dexamethasone   4 mg Oral Q8H   feeding supplement  237 mL Oral BID BM   insulin  aspart  0-5 Units Subcutaneous QHS   insulin  aspart  0-6 Units Subcutaneous TID WC   insulin  aspart  6 Units Subcutaneous TID WC   insulin  glargine-yfgn  15 Units  Subcutaneous Daily   levETIRAcetam   750 mg Oral BID   pantoprazole   40 mg Oral Daily   polyethylene glycol  17 g Oral BID   simvastatin   40 mg Oral q1800   sodium zirconium cyclosilicate   10 g Oral Daily    PRN meds: ALPRAZolam , docusate sodium , hydrALAZINE , HYDROmorphone  (DILAUDID ) injection, LORazepam , melatonin, mouth rinse, oxyCODONE , prochlorperazine    Infusions:    Antimicrobials: Anti-infectives (From admission, onward)    Start     Dose/Rate Route Frequency Ordered Stop   05/01/24 2200  cefTRIAXone  (ROCEPHIN ) 1 g in sodium chloride  0.9 % 100 mL IVPB        1 g 200 mL/hr over 30 Minutes Intravenous Every 24 hours 05/01/24 0439 05/06/24 0751   04/30/24 2300  cefTRIAXone  (ROCEPHIN ) 1 g in sodium chloride  0.9 % 100 mL IVPB        1 g 200 mL/hr over 30 Minutes Intravenous  Once 04/30/24 2255 04/30/24 2329       Objective: Vitals:   05/20/24 2118 05/21/24 0454  BP: 113/66 121/66  Pulse: (!) 51 72  Resp: 16 16  Temp: 97.7 F (36.5 C) (!) 97.5 F (36.4 C)  SpO2: 100% 100%    Intake/Output Summary (Last 24 hours) at 05/21/2024 1307 Last data filed at 05/21/2024 0355 Gross  per 24 hour  Intake --  Output 625 ml  Net -625 ml    Filed Weights   04/30/24 2001 04/30/24 2015  Weight: 89.6 kg 89.6 kg   Weight change:  Body mass index is 33.38 kg/m.   Physical Exam: General.  Frail elderly lady, in no acute distress. Pulmonary.  Lungs clear bilaterally, normal respiratory effort. CV.  Regular rate and rhythm, no JVD, rub or murmur. Abdomen.  Soft, nontender, nondistended, BS positive. CNS.  Alert and oriented to self.  No focal neurologic deficit. Extremities.  No edema, no cyanosis, pulses intact and symmetrical.   Data Review: I have personally reviewed the laboratory data and studies available.  F/u labs ordered Unresulted Labs (From admission, onward)     Start     Ordered   05/12/24 0500  CBC with Differential/Platelet  Tomorrow morning,   R        Question:  Specimen collection method  Answer:  Lab=Lab collect   05/11/24 1326           This record has been created using Dragon voice recognition software. Errors have been sought and corrected,but may not always be located. Such creation errors do not reflect on the standard of care.   Signed, Amaryllis Dare, MD [facility] 05/21/2024  "

## 2024-05-22 ENCOUNTER — Ambulatory Visit

## 2024-05-22 ENCOUNTER — Other Ambulatory Visit: Payer: Self-pay

## 2024-05-22 DIAGNOSIS — R911 Solitary pulmonary nodule: Secondary | ICD-10-CM | POA: Diagnosis not present

## 2024-05-22 DIAGNOSIS — E875 Hyperkalemia: Secondary | ICD-10-CM | POA: Diagnosis not present

## 2024-05-22 DIAGNOSIS — N1831 Chronic kidney disease, stage 3a: Secondary | ICD-10-CM | POA: Diagnosis not present

## 2024-05-22 DIAGNOSIS — I2489 Other forms of acute ischemic heart disease: Secondary | ICD-10-CM | POA: Diagnosis not present

## 2024-05-22 DIAGNOSIS — E1169 Type 2 diabetes mellitus with other specified complication: Secondary | ICD-10-CM | POA: Diagnosis not present

## 2024-05-22 DIAGNOSIS — I10 Essential (primary) hypertension: Secondary | ICD-10-CM | POA: Diagnosis not present

## 2024-05-22 DIAGNOSIS — R197 Diarrhea, unspecified: Secondary | ICD-10-CM | POA: Diagnosis not present

## 2024-05-22 DIAGNOSIS — G35D Multiple sclerosis, unspecified: Secondary | ICD-10-CM | POA: Diagnosis not present

## 2024-05-22 DIAGNOSIS — N179 Acute kidney failure, unspecified: Secondary | ICD-10-CM | POA: Diagnosis not present

## 2024-05-22 DIAGNOSIS — R569 Unspecified convulsions: Secondary | ICD-10-CM | POA: Diagnosis not present

## 2024-05-22 DIAGNOSIS — E785 Hyperlipidemia, unspecified: Secondary | ICD-10-CM | POA: Diagnosis not present

## 2024-05-22 LAB — RENAL FUNCTION PANEL
Albumin: 3.1 g/dL — ABNORMAL LOW (ref 3.5–5.0)
Anion gap: 10 (ref 5–15)
BUN: 34 mg/dL — ABNORMAL HIGH (ref 8–23)
CO2: 23 mmol/L (ref 22–32)
Calcium: 8.7 mg/dL — ABNORMAL LOW (ref 8.9–10.3)
Chloride: 96 mmol/L — ABNORMAL LOW (ref 98–111)
Creatinine, Ser: 0.8 mg/dL (ref 0.44–1.00)
GFR, Estimated: >60 mL/min
Glucose, Bld: 169 mg/dL — ABNORMAL HIGH (ref 70–99)
Phosphorus: 3.1 mg/dL (ref 2.5–4.6)
Potassium: 4.7 mmol/L (ref 3.5–5.1)
Sodium: 129 mmol/L — ABNORMAL LOW (ref 135–145)

## 2024-05-22 LAB — GLUCOSE, CAPILLARY
Glucose-Capillary: 156 mg/dL — ABNORMAL HIGH (ref 70–99)
Glucose-Capillary: 217 mg/dL — ABNORMAL HIGH (ref 70–99)
Glucose-Capillary: 136 mg/dL — ABNORMAL HIGH (ref 70–99)
Glucose-Capillary: 207 mg/dL — ABNORMAL HIGH (ref 70–99)

## 2024-05-22 LAB — RAD ONC ARIA SESSION SUMMARY
Course Elapsed Days: 7
Plan Fractions Treated to Date: 6
Plan Prescribed Dose Per Fraction: 3 Gy
Plan Total Fractions Prescribed: 10
Plan Total Prescribed Dose: 30 Gy
Reference Point Dosage Given to Date: 18 Gy
Reference Point Session Dosage Given: 3 Gy
Session Number: 6

## 2024-05-22 LAB — MAGNESIUM: Magnesium: 1.9 mg/dL (ref 1.7–2.4)

## 2024-05-22 NOTE — Plan of Care (Signed)

## 2024-05-22 NOTE — TOC Progression Note (Signed)
 Transition of Care [facility]) - Progression Note    Patient Details  Name: Sonya Solomon MRN: [id number] Date of Birth: 1945-07-26  Transition [facility]) CM/SW Contact  Heather DELENA Saltness, LCSW Phone Number: 05/22/2024, 10:47 AM  Clinical Narrative:    Pt agreeable to short-term rehab at SNF. CSW completed FL2 and sent referrals to SNF locations in [place] and surrounding areas. Awaiting bed offers. TOC will continue to follow.   Expected Discharge Plan:  (TBD) Barriers to Discharge: Continued Medical Work up   Expected Discharge Plan and Services In-house Referral: NA Discharge Planning Services: CM Consult Post Acute Care Choice: Durable Medical Equipment Living arrangements for the past 2 months: Single Family Home                  Social Drivers of Health (SDOH) Interventions SDOH Screenings   Food Insecurity: No Food Insecurity (05/01/2024)  Housing: Low Risk (05/01/2024)  Transportation Needs: No Transportation Needs (05/01/2024)  Utilities: Not At Risk (05/01/2024)  Social Connections: Moderately Integrated (05/01/2024)  Tobacco Use: Low Risk (05/09/2024)    Readmission Risk Interventions    05/03/2024    4:12 PM  Readmission Risk Prevention Plan  Transportation Screening Complete  PCP or Specialist Appt within 5-7 Days Complete  Home Care Screening Complete  Medication Review (RN CM) Complete    Signed: Heather Saltness, MSW, LCSW Clinical Social Worker Inpatient Care Management 05/22/2024 10:48 AM

## 2024-05-22 NOTE — [place] FL2 (Signed)
 "   MEDICAID FL2 LEVEL OF CARE FORM     IDENTIFICATION  Patient Name: Sonya Solomon Birthdate: 03-16-1946 Sex: female Admission Date (Current Location): 04/30/2024  [facility] and [place] Number:  Producer, Television/film/video and Address:  [facility],  [address] [place], [place] [address]      Provider Number: [id number]  Attending Physician Name and Address:  Caleen Qualia, MD  Relative Name and Phone Number:  Sierrah, [facility]), Emergency Contact  [phone]    Current Level of Care: Hospital Recommended Level of Care: Skilled Nursing Facility Prior Approval Number:    Date Approved/Denied:   PASRR Number: [id number]  Discharge Plan: SNF    Current Diagnoses: Patient Active Problem List   Diagnosis Date Noted   Pulmonary nodule 1 cm or greater in diameter 05/07/2024   CKD stage 3a, GFR 45-59 ml/min (HCC) 05/01/2024   Hyperkalemia 05/01/2024   Demand ischemia (HCC) 05/01/2024   Pyuria 05/01/2024   Diarrhea 05/01/2024   Seizure (HCC) 05/01/2024   HTN (hypertension) 05/01/2024   AKI (acute kidney injury) 04/30/2024   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   Senile purpura 08/29/2020   Vertigo 03/26/2020   Diplopia 03/26/2020   Gait disturbance 03/26/2020   Urinary urgency 03/26/2020   MS (multiple sclerosis) 12/13/2019   Hyperlipidemia associated with type 2 diabetes mellitus (HCC) 12/13/2019   Type 2 diabetes mellitus with other specified complication (HCC) 12/13/2019   Abnormal electrocardiogram 03/09/2011    Orientation RESPIRATION BLADDER Height & Weight     Self, Time, Situation, Place  Normal Continent Weight: 197 lb 8.5 oz (89.6 kg) Height:  5' 4.5 (163.8 cm)  BEHAVIORAL SYMPTOMS/MOOD NEUROLOGICAL BOWEL NUTRITION STATUS      Continent Diet (Carb Modified)  AMBULATORY STATUS COMMUNICATION OF NEEDS Skin   Extensive Assist Verbally Normal                       Personal Care Assistance Level of Assistance  Bathing,  Feeding, Dressing Bathing Assistance: Maximum assistance Feeding assistance: Independent Dressing Assistance: Maximum assistance     Functional Limitations Info  Sight, Hearing, Speech Sight Info: Impaired (eyeglasses) Hearing Info: Impaired Speech Info: Adequate    SPECIAL CARE FACTORS FREQUENCY  PT (By licensed PT), OT (By licensed OT)     PT Frequency: 5x per week OT Frequency: 5x per week            Contractures Contractures Info: Not present    Additional Factors Info  Code Status, Allergies Code Status Info: FULL Allergies Info: Latex           Current Medications (05/22/2024):  This is the current hospital active medication list Current Facility-Administered Medications  Medication Dose Route Frequency Provider Last Rate Last Admin   acetaminophen  (TYLENOL ) tablet 1,000 mg  1,000 mg Oral TID Dahal, Binaya, MD   1,000 mg at 05/22/24 0930   ALPRAZolam  (XANAX ) tablet 0.25 mg  0.25 mg Oral TID PRN Byrum, Robert S, MD   0.25 mg at 05/21/24 1404   desmopressin  (DDAVP ) tablet 0.1 mg  0.1 mg Oral QHS Byrum, Robert S, MD   0.1 mg at 05/21/24 2155   dexamethasone  (DECADRON ) tablet 4 mg  4 mg Oral Q8H Byrum, Robert S, MD   4 mg at 05/22/24 9391   docusate sodium  (COLACE) capsule 100 mg  100 mg Oral BID PRN Byrum, Robert S, MD   100 mg at 05/19/24 1853   feeding supplement (ENSURE PLUS HIGH  PROTEIN) liquid 237 mL  237 mL Oral BID BM Byrum, Robert S, MD   237 mL at 05/20/24 1103   hydrALAZINE  (APRESOLINE ) injection 5 mg  5 mg Intravenous Q6H PRN Shelah Lamar RAMAN, MD       HYDROmorphone  (DILAUDID ) injection 0.5 mg  0.5 mg Intravenous Q3H PRN Byrum, Robert S, MD   0.5 mg at 05/22/24 0256   insulin  aspart (novoLOG ) injection 0-5 Units  0-5 Units Subcutaneous QHS Byrum, Robert S, MD   3 Units at 05/17/24 2117   insulin  aspart (novoLOG ) injection 0-6 Units  0-6 Units Subcutaneous TID WC Byrum, Robert S, MD   1 Units at 05/22/24 9068   insulin  aspart (novoLOG ) injection 6 Units  6  Units Subcutaneous TID WC Dahal, Binaya, MD   6 Units at 05/22/24 0931   insulin  glargine-yfgn (SEMGLEE ) injection 15 Units  15 Units Subcutaneous Daily Dahal, Chapman, MD   15 Units at 05/21/24 1140   levETIRAcetam  (KEPPRA ) tablet 750 mg  750 mg Oral BID Byrum, Robert S, MD   750 mg at 05/21/24 2154   LORazepam  (ATIVAN ) injection 1 mg  1 mg Intravenous Q6H PRN Dahal, Binaya, MD   1 mg at 05/15/24 1301   melatonin tablet 5 mg  5 mg Oral QHS PRN Byrum, Robert S, MD   5 mg at 05/19/24 2123   Oral care mouth rinse  15 mL Mouth Rinse PRN Dahal, Chapman, MD       oxyCODONE  (Oxy IR/ROXICODONE ) immediate release tablet 5 mg  5 mg Oral Q6H PRN Dahal, Chapman, MD   5 mg at 05/20/24 2125   pantoprazole  (PROTONIX ) EC tablet 40 mg  40 mg Oral Daily Byrum, Robert S, MD   40 mg at 05/22/24 0930   polyethylene glycol (MIRALAX  / GLYCOLAX ) packet 17 g  17 g Oral BID Byrum, Robert S, MD   17 g at 05/21/24 1140   prochlorperazine  (COMPAZINE ) injection 5 mg  5 mg Intravenous Q6H PRN Shelah Lamar RAMAN, MD       simvastatin  (ZOCOR ) tablet 40 mg  40 mg Oral q1800 Byrum, Robert S, MD   40 mg at 05/21/24 1717   sodium chloride  tablet 1 g  1 g Oral BID WC Amin, Sumayya, MD   1 g at 05/22/24 0930     Discharge Medications: Please see discharge summary for a list of discharge medications.  Relevant Imaging Results:  Relevant Lab Results:   Additional Information SSN: [id number]  Heather LABOR Francesco Provencal, LCSW     "

## 2024-05-22 NOTE — Plan of Care (Signed)
   Problem: Education: Goal: Knowledge of General Education information will improve Description: Including pain rating scale, medication(s)/side effects and non-pharmacologic comfort measures Outcome: Progressing   Problem: Clinical Measurements: Goal: Ability to maintain clinical measurements within normal limits will improve Outcome: Progressing

## 2024-05-22 NOTE — Progress Notes (Signed)
 " PROGRESS NOTE  Sonya Solomon  DOB: [birth year]  PCP: Leonel Cole, MD FMW:[id number]  DOA: 04/30/2024  LOS: 22 days  Hospital Day: 23   Brief narrative: Sonya Solomon is a 79 y.o. female with PMH significant for DM2, HTN, HLD, CKD 3A, obesity, MS, gait disturbance, vertigo, urinary urgency, spasticity, insomnia Lives at home with her husband. At baseline, walks with a walker and cane and independent with ADLs  12/28, patient presented to ED after a fall, diarrhea She was found to be very dehydrated with creatinine significantly elevated to 5.4 CT abdomen pelvis showed pancolitis Admitted to [facility] course was complicated by new diagnosis of brain mets from lung primary. Currently getting radiation treatment, planned till 05/26/2024 She has been essentially bedbound because of her weakness and refusal to participate with physical therapy.  She has been told that she will close the chance of SNF approval if she continues to refuse to work with PT. 1/19: Palliative care was also consulted pending meeting. TOC started the workup for SNF  Subjective: Patient was seen and examined today.  Had her radiation earlier today.  Feeling little confused and having difficulty with word finding.   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  CT head was obtained which showed multiple intracranial lesions suggestive of metastatic disease with vasogenic edema, local mass effect Follow-up MRI brain was obtained which confirmed multiple enhancing nodules within the cerebral cerebellar hemisphere bilaterally suspicious for metastatic disease. 1/4 CT chest showed 18 x 14 mm irregular nodular density in the right middle lobe.  Seen by pulmonary 1/6, underwent flexible video fiberoptic bronchoscopy with robotic assistance and biopsies.  Biopsy report showed adenocarcinoma of lung. Medical oncology, radiation oncology and  palliative care consulted. 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 Currently on Keppra  750 mg twice daily and Decadron  4 mg every 8 hours,  Acute pancolitis Initially presented with diarrhea dehydration CT scan on admission showed pancolitis Diarrhea improved Currently on bowel regimen with senna and MiraLAX    Hypotension H/o hypertension Hypotension resolved with fluids, midodrine  and Solu-Cortef  Lisinopril  on hold.  Blood pressure currently within goal. Currently also on DDAVP  0.1 mg nightly which is her home dose.  AKI on CKD 3A -resolved Baseline creatinine 1.1.  Presented with creatinine elevated to 5.4 due to diarrhea dehydration.  Renal ultrasound without any obstruction. Creatinine now back to baseline with IV fluid. Monitor renal function Avoid nephrotoxins  Hyperkalemia .  Potassium improved to 4.8. -Continue daily Lokelma  unless potassium goes below 4 -Monitor potassium  Severe hypomagnesemia 1/13, magnesium  level is significantly low at less than 0.5.  Improved with replacement. 1/18: Magnesium  of 1.9  Hyponatremia Further decrease of sodium to 129 today.  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 Salt tablets were added on 1/18 - Continue salt tablets salt tablet - Continue to monitor  E. coli UTI, POA  completed treatment with Rocephin .   Acute urinary retention Foley catheter was inserted in the ED. because of profound weakness and immobility, Foley catheter was continued. She had leakage with Foley catheter on 1/13 and it was removed.  Started on on PureWick.    Demand ischemia with NSVT SVT- Troponin trend  relatively flat 126, 141, 169 Echocardiogram negative for regional wall motion abnormality, EF 70 to 75%, grade 1 diastolic dysfunction, elevated pulmonary artery systolic pressure, LV hypertrophy severe?     Cardiology recommend outpatient follow-up  Seizure disorder Continue Keppra     Multiple sclerosis followed by Dr. Vear neurology as an outpatient.   Seen by PT OT earlier in the course and recommended SNF. Reports that 3 weeks prior to presentation, she was independent.  Gradually declining physically.   -PT is recommending SNF -Encouraged to work with PT    Type 2 diabetes mellitus  Hyperglycemia due to steroids A1c controlled at 7 on 04/30/2024 PTA meds-metformin , Actos , Ozempic  Currently on Lantus  15 units daily, scheduled Premeal aspart 6 units 3 times daily, SSI with Accu-Cheks.    Hyperlipidemia  continue Zocor   Anxiety/depression Xanax  0.25 mg 3 times daily as needed    Mobility:   PT Orders: Continue working with PT  PT Follow up Rec: Skilled Nursing-Short Term Rehab (<3 Hours/Day)05/21/2024 1415    Goals of care   Code Status: Full Code  Palliative care following.    DVT prophylaxis:  Place and maintain sequential compression device Start: 05/10/24 1520   Antimicrobials: None currently Fluid: None Consultants: Oncology, palliative care Family Communication: Talked with husband on phone. Status: Inpatient Level of care:  Med-Surg   Patient is from: Home Needs to continue in-hospital care: Radiation treatment to continue through 1/23. Anticipated d/c to: SNF recommended by PT.  Needs a lot of encouragement to participate with PT   Diet:  Diet Order             Diet Carb Modified Room service appropriate? Yes  Diet effective now                   Scheduled Meds:  acetaminophen   1,000 mg Oral TID   desmopressin   0.1 mg Oral QHS   dexamethasone   4 mg Oral Q8H   feeding supplement  237 mL Oral BID BM   insulin  aspart  0-5 Units  Subcutaneous QHS   insulin  aspart  0-6 Units Subcutaneous TID WC   insulin  aspart  6 Units Subcutaneous TID WC   insulin  glargine-yfgn  15 Units Subcutaneous Daily   levETIRAcetam   750 mg Oral BID   pantoprazole   40 mg Oral Daily   polyethylene glycol  17 g Oral BID   simvastatin   40 mg Oral q1800   sodium chloride   1 g Oral BID WC    PRN meds: ALPRAZolam , docusate sodium , hydrALAZINE , HYDROmorphone  (DILAUDID ) injection, LORazepam , melatonin, mouth rinse, oxyCODONE , prochlorperazine    Infusions:    Antimicrobials: Anti-infectives (From admission, onward)    Start     Dose/Rate Route Frequency Ordered Stop   05/01/24 2200  cefTRIAXone  (ROCEPHIN ) 1 g in sodium chloride  0.9 % 100 mL IVPB        1 g 200 mL/hr over 30 Minutes Intravenous Every 24 hours 05/01/24 0439 05/06/24 0751   04/30/24 2300  cefTRIAXone  (ROCEPHIN ) 1 g in sodium chloride  0.9 % 100 mL IVPB        1 g 200 mL/hr over 30 Minutes Intravenous  Once 04/30/24 2255 04/30/24 2329       Objective: Vitals:   05/22/24 0640 05/22/24 1400  BP: 138/66 127/68  Pulse: (!) 52 (!) 43  Resp:  18  Temp:  (!) 97.4 F (36.3 C)  SpO2: 100%  Intake/Output Summary (Last 24 hours) at 05/22/2024 1420 Last data filed at 05/22/2024 1058 Gross per 24 hour  Intake 240 ml  Output 600 ml  Net -360 ml    Filed Weights   04/30/24 2001 04/30/24 2015  Weight: 89.6 kg 89.6 kg   Weight change:  Body mass index is 33.38 kg/m.   Physical Exam: General. Frail elderly lady,  in no acute distress. Pulmonary.  Lungs clear bilaterally, normal respiratory effort. CV.  Regular rate and rhythm, no JVD, rub or murmur. Abdomen.  Soft, nontender, nondistended, BS positive. CNS.  Alert and oriented .  No focal neurologic deficit. Extremities.  No edema, no cyanosis, pulses intact and symmetrical.  Data Review: I have personally reviewed the laboratory data and studies available.  F/u labs ordered Unresulted Labs (From admission,  onward)     Start     Ordered   05/12/24 0500  CBC with Differential/Platelet  Tomorrow morning,   R       Question:  Specimen collection method  Answer:  Lab=Lab collect   05/11/24 1326           This record has been created using Dragon voice recognition software. Errors have been sought and corrected,but may not always be located. Such creation errors do not reflect on the standard of care.   Signed, Amaryllis Dare, MD [facility] 05/22/2024  "

## 2024-05-23 ENCOUNTER — Ambulatory Visit

## 2024-05-23 ENCOUNTER — Other Ambulatory Visit: Payer: Self-pay

## 2024-05-23 DIAGNOSIS — E785 Hyperlipidemia, unspecified: Secondary | ICD-10-CM | POA: Diagnosis not present

## 2024-05-23 DIAGNOSIS — N179 Acute kidney failure, unspecified: Secondary | ICD-10-CM | POA: Diagnosis not present

## 2024-05-23 DIAGNOSIS — I10 Essential (primary) hypertension: Secondary | ICD-10-CM | POA: Diagnosis not present

## 2024-05-23 DIAGNOSIS — E875 Hyperkalemia: Secondary | ICD-10-CM | POA: Diagnosis not present

## 2024-05-23 DIAGNOSIS — G35D Multiple sclerosis, unspecified: Secondary | ICD-10-CM | POA: Diagnosis not present

## 2024-05-23 DIAGNOSIS — I2489 Other forms of acute ischemic heart disease: Secondary | ICD-10-CM | POA: Diagnosis not present

## 2024-05-23 DIAGNOSIS — N1831 Chronic kidney disease, stage 3a: Secondary | ICD-10-CM | POA: Diagnosis not present

## 2024-05-23 DIAGNOSIS — R569 Unspecified convulsions: Secondary | ICD-10-CM | POA: Diagnosis not present

## 2024-05-23 DIAGNOSIS — R197 Diarrhea, unspecified: Secondary | ICD-10-CM | POA: Diagnosis not present

## 2024-05-23 DIAGNOSIS — R911 Solitary pulmonary nodule: Secondary | ICD-10-CM | POA: Diagnosis not present

## 2024-05-23 DIAGNOSIS — E1169 Type 2 diabetes mellitus with other specified complication: Secondary | ICD-10-CM | POA: Diagnosis not present

## 2024-05-23 LAB — RAD ONC ARIA SESSION SUMMARY
Course Elapsed Days: 8
Plan Fractions Treated to Date: 7
Plan Prescribed Dose Per Fraction: 3 Gy
Plan Total Fractions Prescribed: 10
Plan Total Prescribed Dose: 30 Gy
Reference Point Dosage Given to Date: 21 Gy
Reference Point Session Dosage Given: 3 Gy
Session Number: 7

## 2024-05-23 LAB — GLUCOSE, CAPILLARY
Glucose-Capillary: 172 mg/dL — ABNORMAL HIGH (ref 70–99)
Glucose-Capillary: 244 mg/dL — ABNORMAL HIGH (ref 70–99)
Glucose-Capillary: 246 mg/dL — ABNORMAL HIGH (ref 70–99)
Glucose-Capillary: 190 mg/dL — ABNORMAL HIGH (ref 70–99)

## 2024-05-23 NOTE — Progress Notes (Signed)
 "  Palliative Medicine Inpatient Follow Up Note HPI:  Sonya Solomon is a 79 year old female with a past medical history significant for MS, type 2 diabetes, hyperlipidemia, hypertension, obesity, insomnia, anxiety, depression, and seizures.  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  Palliative care has been asked to support additional bedside care conversations however patient has been resistant to present date.  Today's Discussion 05/23/2024  I reviewed the chart notes including nursing notes from Kaitlyn Bantel, progress notes from Dr. Caleen. I also reviewed vital signs HR was low this morning in the 40's (appears to run 40's-50s), nursing flowsheets - has been eating and drinking well, medication administrations record, labs inclusive of BP from 1/19 which showed a Na of 129, and imaging MRI os brain from 1/10.    I met with Sonya Solomon at bedside this morning. She is awake and alert. I shared with her my name and role in her care. She is tearful during my time with her and expresses that her memory is worsening. We reviewed that she is having a tough time in the setting of the metastasis to her brain. She notes that she is unable to make decisions for herself due to her forgetfulness and requests that I speak to her husband directly. She then complains to me that no one is keeping her involved in what is going on with her care. I spend a great deal of time at bedside clarifying what I do. I shared my hope to speak to both she and her husband in regards to what is going on and what the goals moving forward are. She states that getting in touch with her husband may be difficult given his inability to drive.   I asked Sonya Solomon if she had ever created a living will to further indicate what her wishes would be in a situation such as this. She shares that her husband has all of this in a file. She shares feeling  overwhelmed with her loss of independence as she is the one in their home who does everything. Allowed her time and space to express herself. Offered emotional support through therapeutic listening.   Patient and I reviewed her current clinical state in the setting of her cancer - thought to be primary to her lungs with metastasis to her brain. We reviewed her declining physical state as well as mental faculties. We discussed the importance of conversations moving forward inclusive of what quality of life will look like moving forward. Patient is tearful during my time with her. She prefers to discuss this only when absolutely needed. She is agreeable to my calling her husband and setting up a meeting.   I called patients spouse, Sonya Solomon this morning. I shared again, my role in patients care. We reviewed patients current clinical condition. He understands the plan for a facility to optimize her strength. He is agreeable to meeting on Saturday at [facility] to further discuss her cancer and assert patient wishes moving forward.   Questions and concerns addressed/Palliative Support Provided.   Objective Assessment: Vital Signs Vitals:   05/22/24 2023 05/23/24 0407  BP: 137/72 (!) 145/78  Pulse: (!) 48 (!) 44  Resp: 18 18  Temp: 97.7 F (36.5 C) 97.6 F (36.4 C)  SpO2: 99% 99%    Intake/Output Summary (Last 24 hours) at 05/23/2024 1002 Last data filed at 05/23/2024 0900 Gross per 24 hour  Intake 240 ml  Output 625 ml  Net -385 ml   Last Weight  Most recent update: 04/30/2024  8:15 PM    Weight  89.6 kg (197 lb 8.5 oz)            Gen:  Elderly Caucasian F chronically ill in appearance HEENT: moist mucous membranes CV: Regular rate and rhythm  PULM:  On RA, breathing is even and nonlabored ABD: soft/nontender  EXT: No edema  Neuro: Alert and oriented to person, place, and some of situation - gets confused during conversation  SUMMARY OF RECOMMENDATIONS   Full Code / Full Scope of  Care  Have shared with patient the importance of conversations now and moving forward given significance of her disease burden and potential impacts on her health now and into the future --> Patient having a tough time with her current clinical state and declining mentation  Plan for family meeting on Saturday at [facility] with patients spouse --> this is the soonest he can come with his current inability to drive and the need to coordinate transportation  Emotional support provided  Ongoing PMT support  ______________________________________________________________________________________ Rosaline Becton [facility] Palliative Medicine Team Team Cell Phone: [phone] Please utilize secure chat with additional questions, if there is no response within 30 minutes please call the above phone number  I personally spent a total of 47 minutes in the care of the patient today including preparing to see the patient, getting/reviewing separately obtained history, counseling and educating, placing orders, referring and communicating with other health care professionals, documenting clinical information in the EHR, independently interpreting results, and coordinating care.     "

## 2024-05-23 NOTE — Progress Notes (Signed)
 " PROGRESS NOTE  Sonya Solomon  DOB: 12-28-1945  PCP: Leonel Cole, MD FMW:[id number]  DOA: 04/30/2024  LOS: 23 days  Hospital Day: 24   Brief narrative: Sonya Solomon is a 79 y.o. female with PMH significant for DM2, HTN, HLD, CKD 3A, obesity, MS, gait disturbance, vertigo, urinary urgency, spasticity, insomnia Lives at home with her husband. At baseline, walks with a walker and cane and independent with ADLs  12/28, patient presented to ED after a fall, diarrhea She was found to be very dehydrated with creatinine significantly elevated to 5.4 CT abdomen pelvis showed pancolitis Admitted to [facility] course was complicated by new diagnosis of brain mets from lung primary. Currently getting radiation treatment, planned till 05/26/2024 She has been essentially bedbound because of her weakness and refusal to participate with physical therapy.  She has been told that she will close the chance of SNF approval if she continues to refuse to work with PT. 1/19: Palliative care was also consulted pending meeting. TOC started the workup for SNF 1/20: Had her radiation treatment today, palliative meeting set for upcoming Saturday.  Subjective: Patient with worsening lower extremity weakness, and numbness, right worse than left, per radiation oncology likely secondary to brain mets and current radiation.  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  CT head was obtained which showed multiple intracranial lesions suggestive of metastatic disease with vasogenic edema, local mass effect Follow-up MRI brain was obtained which confirmed multiple enhancing nodules within the cerebral cerebellar hemisphere bilaterally suspicious for metastatic disease. 1/4 CT chest showed 18 x 14 mm irregular nodular density in the right middle lobe.  Seen by pulmonary 1/6, underwent flexible video fiberoptic bronchoscopy with robotic  assistance and biopsies.  Biopsy report showed adenocarcinoma of lung. Medical oncology, radiation oncology and palliative care consulted. 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 Currently on Keppra  750 mg twice daily and Decadron  4 mg every 8 hours,  Acute pancolitis Initially presented with diarrhea dehydration CT scan on admission showed pancolitis Diarrhea improved Currently on bowel regimen with senna and MiraLAX    Hypotension H/o hypertension Hypotension resolved with fluids, midodrine  and Solu-Cortef  Lisinopril  on hold.  Blood pressure currently within goal. Currently also on DDAVP  0.1 mg nightly which is her home dose.  AKI on CKD 3A -resolved Baseline creatinine 1.1.  Presented with creatinine elevated to 5.4 due to diarrhea dehydration.  Renal ultrasound without any obstruction. Creatinine now back to baseline with IV fluid. Monitor renal function Avoid nephrotoxins  Hyperkalemia .  Potassium improved to 4.8. -Continue daily Lokelma  unless potassium goes below 4 -Monitor potassium  Severe hypomagnesemia 1/13, magnesium  level is significantly low at less than 0.5.  Improved with replacement. 1/18: Magnesium  of 1.9  Hyponatremia Sodium at 129 on 1/19.  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 Salt tablets were added on 1/18 - Continue salt tablets salt tablet - Continue to monitor  E. coli UTI, POA  completed treatment with Rocephin .   Acute urinary retention Foley catheter was inserted in the ED. because of profound weakness and immobility, Foley catheter was continued. She had  leakage with Foley catheter on 1/13 and  it was removed.  Started on on PureWick.    Demand ischemia with NSVT SVT- Troponin trend relatively flat 126, 141, 169 Echocardiogram negative for regional wall motion abnormality, EF 70 to 75%, grade 1 diastolic dysfunction, elevated pulmonary artery systolic pressure, LV hypertrophy severe?     Cardiology recommend outpatient follow-up  Seizure disorder Continue Keppra     Multiple sclerosis followed by Dr. Vear neurology as an outpatient.   Seen by PT OT earlier in the course and recommended SNF. Reports that 3 weeks prior to presentation, she was independent.  Gradually declining physically.   -PT is recommending SNF -Encouraged to work with PT    Type 2 diabetes mellitus  Hyperglycemia due to steroids A1c controlled at 7 on 04/30/2024 PTA meds-metformin , Actos , Ozempic  Currently on Lantus  15 units daily, scheduled Premeal aspart 6 units 3 times daily, SSI with Accu-Cheks.    Hyperlipidemia  continue Zocor   Anxiety/depression Xanax  0.25 mg 3 times daily as needed    Mobility:   PT Orders: Continue working with PT  PT Follow up Rec: Skilled Nursing-Short Term Rehab (<3 Hours/Day)05/21/2024 1415    Goals of care   Code Status: Full Code  Palliative care following.    DVT prophylaxis:  Place and maintain sequential compression device Start: 05/10/24 1520   Antimicrobials: None currently Fluid: None Consultants: Oncology, palliative care Family Communication: Talked with husband on phone. Status: Inpatient Level of care:  Med-Surg   Patient is from: Home Needs to continue in-hospital care: Radiation treatment to continue through 1/23. Anticipated d/c to: SNF recommended by PT.  Needs a lot of encouragement to participate with PT   Diet:  Diet Order             Diet Carb Modified Room service appropriate? Yes  Diet effective now                   Scheduled Meds:  acetaminophen   1,000 mg Oral TID   desmopressin   0.1 mg Oral QHS   dexamethasone    4 mg Oral Q8H   feeding supplement  237 mL Oral BID BM   insulin  aspart  0-5 Units Subcutaneous QHS   insulin  aspart  0-6 Units Subcutaneous TID WC   insulin  aspart  6 Units Subcutaneous TID WC   insulin  glargine-yfgn  15 Units Subcutaneous Daily   levETIRAcetam   750 mg Oral BID   pantoprazole   40 mg Oral Daily   polyethylene glycol  17 g Oral BID   simvastatin   40 mg Oral q1800   sodium chloride   1 g Oral BID WC    PRN meds: ALPRAZolam , docusate sodium , hydrALAZINE , HYDROmorphone  (DILAUDID ) injection, LORazepam , melatonin, mouth rinse, oxyCODONE , prochlorperazine    Infusions:    Antimicrobials: Anti-infectives (From admission, onward)    Start     Dose/Rate Route Frequency Ordered Stop   05/01/24 2200  cefTRIAXone  (ROCEPHIN ) 1 g in sodium chloride  0.9 % 100 mL IVPB        1 g 200 mL/hr over 30 Minutes Intravenous Every 24 hours 05/01/24 0439 05/06/24 0751   04/30/24 2300  cefTRIAXone  (ROCEPHIN ) 1 g in sodium chloride  0.9 % 100 mL IVPB        1 g 200 mL/hr over 30 Minutes Intravenous  Once 04/30/24 2255 04/30/24 2329       Objective: Vitals:   05/23/24 0407 05/23/24 1328  BP: (!) 145/78 116/65  Pulse: (!) 44 68  Resp: 18  Temp: 97.6 F (36.4 C) 97.6 F (36.4 C)  SpO2: 99% 97%    Intake/Output Summary (Last 24 hours) at 05/23/2024 1424 Last data filed at 05/23/2024 0900 Gross per 24 hour  Intake 240 ml  Output 425 ml  Net -185 ml    Filed Weights   04/30/24 2001 04/30/24 2015  Weight: 89.6 kg 89.6 kg   Weight change:  Body mass index is 33.38 kg/m.   Physical Exam: General.  Frail and confused elderly lady, in no acute distress. Pulmonary.  Lungs clear bilaterally, normal respiratory effort. CV.  Regular rate and rhythm, no JVD, rub or murmur. Abdomen.  Soft, nontender, nondistended, BS positive. CNS.  Alert and oriented .  Bilateral lower extremity weakness, worse on right. Extremities.  No edema,  pulses intact and symmetrical.   Data Review: I  have personally reviewed the laboratory data and studies available.  F/u labs ordered Unresulted Labs (From admission, onward)     Start     Ordered   05/12/24 0500  CBC with Differential/Platelet  Tomorrow morning,   R       Question:  Specimen collection method  Answer:  Lab=Lab collect   05/11/24 1326           This record has been created using Dragon voice recognition software. Errors have been sought and corrected,but may not always be located. Such creation errors do not reflect on the standard of care.   Signed, Amaryllis Dare, MD [facility] 05/23/2024  "

## 2024-05-23 NOTE — TOC Progression Note (Signed)
 Transition of Care [facility]) - Progression Note    Patient Details  Name: Sonya Solomon MRN: [id number] Date of Birth: [birth year]  Transition of Care [facility]) CM/SW Contact  Heather DELENA Saltness, LCSW Phone Number: 05/23/2024, 2:40 PM  Clinical Narrative:    CSW met with pt at bedside to discuss SNF bed availability and obtain pt's preferred facility. CSW provided pt with list of facilities with available beds including name of facility, location, and Medicare Star-Rating. Pt reports her husband doesn't drive, would prefer facility close to their home in [facility] [facility]. Pt reports family meeting scheduled for Saturday 1/24 at 1400. Pt reports she is leaning towards [facility], however, she will call her husband to discuss choices before final decision is made. Pt also requests if team can discuss SNF rehab at family meeting scheduled for Saturday. MD and NP notified. TOC will continue to follow.  [facility]  [facility] [address] [place], [place] [address] [phone] Overall rating ?? Much below average  [facility] [address] [place], [place] [address] [phone] Overall rating ?????  [facility] [address] [place], [place] [address] [phone] Overall rating ???? Average  [facility] [address] [place], [place] [address] [phone] Overall rating ? Much below average  Expected Discharge Plan:  (TBD) Barriers to Discharge: Continued Medical Work up   Expected Discharge Plan and Services In-house Referral: NA Discharge Planning Services: CM Consult Post Acute Care Choice: Durable Medical Equipment Living arrangements for the past 2 months: Single Family Home                  Social Drivers of Health (SDOH) Interventions SDOH Screenings   Food Insecurity: No Food Insecurity (05/01/2024)  Housing: Low Risk (05/01/2024)  Transportation  Needs: No Transportation Needs (05/01/2024)  Utilities: Not At Risk (05/01/2024)  Social Connections: Moderately Integrated (05/01/2024)  Tobacco Use: Low Risk (05/09/2024)    Readmission Risk Interventions    05/03/2024    4:12 PM  Readmission Risk Prevention Plan  Transportation Screening Complete  PCP or Specialist Appt within 5-7 Days Complete  Home Care Screening Complete  Medication Review (RN CM) Complete    Signed: Heather Saltness, MSW, LCSW Clinical Social Worker Inpatient Care Management 05/23/2024 2:46 PM

## 2024-05-23 NOTE — Plan of Care (Signed)
" °  Problem: Coping: Goal: Ability to adjust to condition or change in health will improve Outcome: Progressing   Problem: Fluid Volume: Goal: Ability to maintain a balanced intake and output will improve Outcome: Progressing   Problem: Metabolic: Goal: Ability to maintain appropriate glucose levels will improve Outcome: Progressing   Problem: Nutritional: Goal: Maintenance of adequate nutrition will improve Outcome: Progressing   Problem: Skin Integrity: Goal: Risk for impaired skin integrity will decrease Outcome: Progressing   Problem: Clinical Measurements: Goal: Will remain free from infection Outcome: Progressing   Problem: Activity: Goal: Risk for activity intolerance will decrease Outcome: Progressing   Problem: Nutrition: Goal: Adequate nutrition will be maintained Outcome: Progressing   Problem: Coping: Goal: Level of anxiety will decrease Outcome: Progressing   Problem: Elimination: Goal: Will not experience complications related to bowel motility Outcome: Progressing Goal: Will not experience complications related to urinary retention Outcome: Progressing   Problem: Pain Managment: Goal: General experience of comfort will improve and/or be controlled Outcome: Progressing   Problem: Safety: Goal: Ability to remain free from injury will improve Outcome: Progressing   Problem: Skin Integrity: Goal: Risk for impaired skin integrity will decrease Outcome: Progressing   "

## 2024-05-24 ENCOUNTER — Ambulatory Visit

## 2024-05-24 ENCOUNTER — Encounter (HOSPITAL_COMMUNITY): Payer: Self-pay

## 2024-05-24 ENCOUNTER — Other Ambulatory Visit: Payer: Self-pay

## 2024-05-24 DIAGNOSIS — Z515 Encounter for palliative care: Secondary | ICD-10-CM

## 2024-05-24 DIAGNOSIS — R911 Solitary pulmonary nodule: Secondary | ICD-10-CM | POA: Diagnosis not present

## 2024-05-24 LAB — RAD ONC ARIA SESSION SUMMARY
Course Elapsed Days: 9
Plan Fractions Treated to Date: 8
Plan Prescribed Dose Per Fraction: 3 Gy
Plan Total Fractions Prescribed: 10
Plan Total Prescribed Dose: 30 Gy
Reference Point Dosage Given to Date: 24 Gy
Reference Point Session Dosage Given: 3 Gy
Session Number: 8

## 2024-05-24 LAB — CBC
HCT: 39.8 % (ref 36.0–46.0)
Hemoglobin: 13.2 g/dL (ref 12.0–15.0)
MCH: 31.4 pg (ref 26.0–34.0)
MCHC: 33.2 g/dL (ref 30.0–36.0)
MCV: 94.8 fL (ref 80.0–100.0)
Platelets: 210 K/uL (ref 150–400)
RBC: 4.2 MIL/uL (ref 3.87–5.11)
RDW: 15.2 % (ref 11.5–15.5)
WBC: 14 K/uL — ABNORMAL HIGH (ref 4.0–10.5)
nRBC: 0 % (ref 0.0–0.2)

## 2024-05-24 LAB — RENAL FUNCTION PANEL
Albumin: 2.9 g/dL — ABNORMAL LOW (ref 3.5–5.0)
Anion gap: 9 (ref 5–15)
BUN: 32 mg/dL — ABNORMAL HIGH (ref 8–23)
CO2: 24 mmol/L (ref 22–32)
Calcium: 8.6 mg/dL — ABNORMAL LOW (ref 8.9–10.3)
Chloride: 99 mmol/L (ref 98–111)
Creatinine, Ser: 0.84 mg/dL (ref 0.44–1.00)
GFR, Estimated: >60 mL/min
Glucose, Bld: 212 mg/dL — ABNORMAL HIGH (ref 70–99)
Phosphorus: 3 mg/dL (ref 2.5–4.6)
Potassium: 5.1 mmol/L (ref 3.5–5.1)
Sodium: 131 mmol/L — ABNORMAL LOW (ref 135–145)

## 2024-05-24 LAB — GLUCOSE, CAPILLARY
Glucose-Capillary: 180 mg/dL — ABNORMAL HIGH (ref 70–99)
Glucose-Capillary: 305 mg/dL — ABNORMAL HIGH (ref 70–99)
Glucose-Capillary: 179 mg/dL — ABNORMAL HIGH (ref 70–99)
Glucose-Capillary: 155 mg/dL — ABNORMAL HIGH (ref 70–99)

## 2024-05-24 MED ORDER — ALUM & MAG HYDROXIDE-SIMETH 200-200-20 MG/5ML PO SUSP
15.0000 mL | Freq: Four times a day (QID) | ORAL | Status: DC | PRN
  Administered 2024-05-24: 15 mL via ORAL
  Filled 2024-05-24: qty 30

## 2024-05-24 NOTE — Progress Notes (Signed)
 " PROGRESS NOTE    Sonya Solomon  FMW:[id number] DOB: 05-10-1945 DOA: 04/30/2024 PCP: Leonel Cole, MD   Brief Narrative: 79 y.o. female with PMH significant for DM2, HTN, HLD, CKD 3A, obesity, MS, gait disturbance, vertigo, urinary urgency, spasticity, insomnia Lives at home with her husband. At baseline, walks with a walker and cane and independent with ADLs   12/28, patient presented to ED after a fall, diarrhea She was found to be very dehydrated with creatinine significantly elevated to 5.4 CT abdomen pelvis showed pancolitis Admitted to [facility] course was complicated by new diagnosis of brain mets from lung primary. Currently getting radiation treatment, planned till 05/26/2024 She has been essentially bedbound because of her weakness and refusal to participate with physical therapy.  She has been told that she will close the chance of SNF approval if she continues to refuse to work with PT. 1/19: Palliative care was also consulted pending meeting. TOC started the workup for SNF 1/20: Had her radiation treatment today, palliative meeting set for upcoming Saturday.  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  CT head was obtained which showed multiple intracranial lesions suggestive of metastatic disease with vasogenic edema, local mass effect Follow-up MRI brain was obtained which confirmed multiple enhancing nodules within the cerebral cerebellar hemisphere bilaterally suspicious for metastatic disease. 1/4 CT chest showed 18  x 14 mm irregular nodular density in the right middle lobe.  Seen by pulmonary 1/6, underwent flexible video fiberoptic bronchoscopy with robotic assistance and biopsies.  Biopsy report showed adenocarcinoma of lung. Medical oncology, radiation oncology and palliative care consulted. 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 Currently on Keppra  750 mg twice daily and Decadron  4 mg every 8 hours,1/3.   Acute pancolitis Initially presented with diarrhea dehydration CT scan on admission showed pancolitis Diarrhea improved Currently on bowel regimen with senna and MiraLAX     Hypotension H/o hypertension Hypotension resolved with fluids, midodrine  and Solu-Cortef  Lisinopril  on hold.  Blood pressure currently within goal. Currently also on DDAVP  0.1 mg nightly which is her home dose.   AKI on CKD 3A -resolved Baseline creatinine 1.1.  Presented with creatinine elevated to 5.4 due to diarrhea dehydration.  Renal ultrasound without any obstruction. Creatinine now back to baseline with IV fluid. Monitor renal function Avoid nephrotoxins   Hyperkalemia .  Potassium improved to 4.8. -Continue daily Lokelma  unless potassium goes below 4 -Monitor potassium   Severe hypomagnesemia 1/13, magnesium  level is significantly low at less than 0.5.  Improved with replacement. 1/18: Magnesium  of 1.9   Hyponatremia Sodium at 129 on 1/19.  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 Salt tablets were added on  1/18 - Continue salt tablets salt tablet - Continue to monitor -na 131  E. coli UTI, POA  completed treatment with Rocephin .    Acute urinary  retention Foley catheter was inserted in the ED. because of profound weakness and immobility, Foley catheter was continued. She had leakage with Foley catheter on 1/13 and it was removed.  Started on on PureWick.    Demand ischemia with NSVT SVT- Troponin trend relatively flat 126, 141, 169 Echocardiogram negative for regional wall motion abnormality, EF 70 to 75%, grade 1 diastolic dysfunction, elevated pulmonary artery systolic pressure, LV hypertrophy severe?     Cardiology recommend outpatient follow-up   Seizure disorder Continue Keppra     Multiple sclerosis followed by Dr. Vear neurology as an outpatient.   Seen by PT OT earlier in the course and recommended SNF. Reports that 3 weeks prior to presentation, she was independent.  Gradually declining physically.   -PT is recommending SNF -Encouraged to work with PT    Type 2 diabetes mellitus  Hyperglycemia due to steroids A1c controlled at 7 on 04/30/2024 PTA meds-metformin , Actos , Ozempic  Currently on Lantus  15 units daily, scheduled Premeal aspart 6 units 3 times daily, SSI with Accu-Cheks.     Hyperlipidemia  continue Zocor    Anxiety/depression Xanax  0.25 mg 3 times daily as needed    Estimated body mass index is 33.38 kg/m as calculated from the following:   Height as of this encounter: 5' 4.5 (1.638 m).   Weight as of this encounter: 89.6 kg.  DVT prophylaxis:scd Code Status:full Family Communication:none Disposition Plan:  Status is: Inpatient   Consultants: Oncology, neurology, cardiology, PCCM, palliative care  Procedures: Bronch and biopsy Antimicrobials: None  Subjective: Resting in bed very upset that I woke her up complaints of right-sided weakness more than the left  Objective: Vitals:   05/23/24 0407 05/23/24 1328 05/23/24 2037 05/24/24 0457  BP: (!) 145/78 116/65 109/61 127/68  Pulse: (!) 44 68 (!) 56 (!) 57  Resp: 18  19 18   Temp: 97.6 F (36.4 C) 97.6 F (36.4 C) 97.9 F (36.6 C) (!)  97.4 F (36.3 C)  TempSrc: Oral Oral  Oral  SpO2: 99% 97% 99% 97%  Weight:      Height:        Intake/Output Summary (Last 24 hours) at 05/24/2024 1332 Last data filed at 05/24/2024 0100 Gross per 24 hour  Intake --  Output 250 ml  Net -250 ml   Filed Weights   04/30/24 2001 04/30/24 2015  Weight: 89.6 kg 89.6 kg    Examination:  General exam: Appears in no acute distress Respiratory system: Clear to auscultation. Respiratory effort normal. Cardiovascular system: reg. Gastrointestinal system: Abdomen is nondistended, soft and nontender. No organomegaly or masses felt. Normal bowel sounds heard. Central nervous system: Alert and oriented.  Extremities:edema b/l   Data Reviewed: I have personally reviewed following labs and imaging studies  CBC: Recent Labs  Lab 05/20/24 0551 05/24/24 0601  WBC 8.9 14.0*  NEUTROABS 7.6  --   HGB 12.8 13.2  HCT 38.2 39.8  MCV 93.2 94.8  PLT 250 210   Basic Metabolic Panel: Recent Labs  Lab 05/20/24 0551 05/21/24 0550 05/22/24 0500 05/24/24 0601  NA 132* 127* 129* 131*  K 5.3* 4.8 4.7 5.1  CL 97* 94* 96* 99  CO2 27 25 23 24   GLUCOSE 144* 216* 169* 212*  BUN 33* 33* 34* 32*  CREATININE 0.86 0.80 0.80 0.84  CALCIUM 8.9 8.8* 8.7* 8.6*  MG  --  1.9 1.9  --   PHOS  --  3.3 3.1 3.0   GFR: Estimated Creatinine Clearance: 62.4 mL/min (by C-G formula based on SCr of 0.84 mg/dL). Liver Function Tests: Recent Labs  Lab 05/21/24 0550 05/22/24 0500 05/24/24 0601  ALBUMIN  3.2* 3.1* 2.9*   No results for input(s): LIPASE, AMYLASE in the last 168 hours. No results for input(s): AMMONIA in the last 168 hours. Coagulation Profile: No results for input(s): INR, PROTIME in the last 168 hours. Cardiac Enzymes: No results for input(s): CKTOTAL, CKMB, CKMBINDEX, TROPONINI in the last 168 hours. BNP (last 3 results) No results for input(s): PROBNP in the last 8760 hours. HbA1C: No results for input(s): HGBA1C  in the last 72 hours. CBG: Recent Labs  Lab 05/23/24 1243 05/23/24 1716 05/23/24 2202 05/24/24 0748 05/24/24 1204  GLUCAP 244* 246* 190* 180* 305*   Lipid Profile: No results for input(s): CHOL, HDL, LDLCALC, TRIG, CHOLHDL, LDLDIRECT in the last 72 hours. Thyroid  Function Tests: No results for input(s): TSH, T4TOTAL, FREET4, T3FREE, THYROIDAB in the last 72 hours. Anemia Panel: No results for input(s): VITAMINB12, FOLATE, FERRITIN, TIBC, IRON, RETICCTPCT in the last 72 hours. Sepsis Labs: No results for input(s): PROCALCITON, LATICACIDVEN in the last 168 hours.  No results found for this or any previous visit (from the past 240 hours).     Radiology Studies: No results found.   Scheduled Meds:  acetaminophen   1,000 mg Oral TID   desmopressin   0.1 mg Oral QHS   dexamethasone   4 mg Oral Q8H   feeding supplement  237 mL Oral BID BM   insulin  aspart  0-5 Units Subcutaneous QHS   insulin  aspart  0-6 Units Subcutaneous TID WC   insulin  aspart  6 Units Subcutaneous TID WC   insulin  glargine-yfgn  15 Units Subcutaneous Daily   levETIRAcetam   750 mg Oral BID   pantoprazole   40 mg Oral Daily   polyethylene glycol  17 g Oral BID   simvastatin   40 mg Oral q1800   sodium chloride   1 g Oral BID WC   Continuous Infusions:   LOS: 24 days    Sonya KANDICE Hoots, MD 05/24/2024, 1:32 PM   "

## 2024-05-24 NOTE — Plan of Care (Signed)
" °  Problem: Education: Goal: Ability to describe self-care measures that may prevent or decrease complications (Diabetes Survival Skills Education) will improve Outcome: Progressing Goal: Individualized Educational Video(s) Outcome: Progressing   Problem: Fluid Volume: Goal: Ability to maintain a balanced intake and output will improve Outcome: Progressing   Problem: Skin Integrity: Goal: Risk for impaired skin integrity will decrease Outcome: Progressing   Problem: Tissue Perfusion: Goal: Adequacy of tissue perfusion will improve Outcome: Progressing   Problem: Education: Goal: Knowledge of General Education information will improve Description: Including pain rating scale, medication(s)/side effects and non-pharmacologic comfort measures Outcome: Progressing   Problem: Health Behavior/Discharge Planning: Goal: Ability to manage health-related needs will improve Outcome: Progressing   Problem: Clinical Measurements: Goal: Ability to maintain clinical measurements within normal limits will improve Outcome: Progressing Goal: Will remain free from infection Outcome: Progressing Goal: Diagnostic test results will improve Outcome: Progressing Goal: Respiratory complications will improve Outcome: Progressing Goal: Cardiovascular complication will be avoided Outcome: Progressing   "

## 2024-05-24 NOTE — Plan of Care (Signed)
" ° °  Palliative Medicine Inpatient Follow Up Note   Chart review completed this morning. Patient had requested that we see her sparingly due to the context of conversation(s).   Plan for Palliative care meeting with Sonya Solomon and her spouse, Sonya Solomon Saturday at [facility]. I will not be present though my colleague, Recardo Bowen will support the meeting.  ______________________________________________________________________________________ Sonya Solomon [facility] Palliative Medicine Team Team Cell Phone: [phone] Please utilize secure chat with additional questions, if there is no response within 30 minutes please call the above phone number  No Charge   "

## 2024-05-24 NOTE — Progress Notes (Signed)
 Occupational Therapy Treatment Patient Details Name: Sonya Solomon MRN: [id number] DOB: 22-Dec-1945 Today's Date: 05/24/2024   History of present illness Sonya Solomon is a 79 yr old female who presented to the ED on 04/30/24 with diarrhea and after a fall. She was found to be very dehydrated with creatinine significantly elevated to 5.4  CT abdomen pelvis showed pancolitis.  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  Currently getting radiation treatment, planned till 05/26/2024. PMH significant for DM2, HTN, HLD, CKD 3A, obesity, MS, gait disturbance, vertigo, urinary urgency, spasticity, insomnia   OT comments  The pt was received in bed. She declined to attempt supine to sit or other out of bed activity, staing she had a difficult day. She was however amendable to being positioned upright in the chair position in bed to promote upright tolerance and core strengthening. She was in the chair position for ~15 minutes while simultaneoulsy being instructed on therapeutic exercises in bed. She used a light resistance exercise band to perform upper body exercises with SBA. Continue OT plan of care. Patient will benefit from continued inpatient follow up therapy, <3 hours/day.       If plan is discharge home, recommend the following:  A lot of help with walking and/or transfers;A lot of help with bathing/dressing/bathroom;Help with stairs or ramp for entrance;Assistance with cooking/housework;Assist for transportation;Direct supervision/assist for financial management;Direct supervision/assist for medications management   Equipment Recommendations  Other (comment) (defer to next setting)    Recommendations for Other Services      Precautions / Restrictions Precautions Precautions: Fall Restrictions Weight Bearing Restrictions Per Provider Order: No Other Position/Activity Restrictions: baseline R sided weakness, more pronounced in leg       Mobility Bed  Mobility      General bed mobility comments: The pt declined to attempt supine to sit or other out of bed activity, staing she had a difficult day. She was however amendable to being positioned upright in the chair position in bed to promote upright tolerance and core strengthening. She was in the chair position for ~15 minutes while simultaneoulsy being instructed on therapeutic exercises in bed    Transfers      General transfer comment: pt declined to attempt         ADL either performed or assessed with clinical judgement   ADL Overall ADL's : Needs assistance/impaired         Cognition Arousal: Alert Behavior During Therapy: Anxious Cognition: Cognition impaired       Memory impairment (select all impairments): Working civil service fast streamer, Short-term memory Attention impairment (select first level of impairment): Divided attention, Selective attention Executive functioning impairment (select all impairments): Reasoning, Problem solving, Sequencing OT - Cognition Comments: 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                 Following commands: Impaired Following commands impaired: Follows one step commands with increased time      Cueing   Cueing Techniques: Verbal cues, Tactile cues, Gestural cues             Pertinent Vitals/ Pain       Pain Assessment Pain Assessment: No/denies pain   Frequency  Min 2X/week        Progress Toward Goals  OT Goals(current goals can now be found in the care plan section)     Acute Rehab OT Goals Patient Stated Goal: to get stronger and to return home OT Goal Formulation: With patient Time  For Goal Achievement: 06/04/24 Potential to Achieve Goals: Fair  Plan         AM-PAC OT 6 Clicks Daily Activity     Outcome Measure   Help from another person eating meals?: A Little Help from another person taking care of personal grooming?: A Little Help from another person toileting, which includes using toliet,  bedpan, or urinal?: A Lot Help from another person bathing (including washing, rinsing, drying)?: A Lot Help from another person to put on and taking off regular upper body clothing?: A Little Help from another person to put on and taking off regular lower body clothing?: Total 6 Click Score: 14    End of Session Equipment Utilized During Treatment: Other (comment) (exercise band)  OT Visit Diagnosis: Other abnormalities of gait and mobility (R26.89);Muscle weakness (generalized) (M62.81);Other symptoms and signs involving cognitive function   Activity Tolerance Other (comment) (Fair tolerance)   Patient Left in bed;with call bell/phone within reach;with bed alarm set   Nurse Communication Other (comment) (pt requested for nursing staff to come back at 5:00 pm to adjust her pillow)        Time: 8370-8354 OT Time Calculation (min): 16 min  Charges: OT General Charges $OT Visit: 1 Visit OT Treatments $Therapeutic Activity: 8-22 mins    [name], OTR/L 05/24/2024, 5:35 PM

## 2024-05-24 NOTE — Plan of Care (Signed)
" °  Problem: Coping: Goal: Ability to adjust to condition or change in health will improve Outcome: Progressing   Problem: Fluid Volume: Goal: Ability to maintain a balanced intake and output will improve Outcome: Progressing   Problem: Metabolic: Goal: Ability to maintain appropriate glucose levels will improve Outcome: Progressing   Problem: Nutritional: Goal: Maintenance of adequate nutrition will improve Outcome: Progressing Goal: Progress toward achieving an optimal weight will improve Outcome: Progressing   Problem: Skin Integrity: Goal: Risk for impaired skin integrity will decrease Outcome: Progressing   Problem: Coping: Goal: Level of anxiety will decrease Outcome: Progressing   Problem: Pain Managment: Goal: General experience of comfort will improve and/or be controlled Outcome: Progressing   Problem: Safety: Goal: Ability to remain free from injury will improve Outcome: Progressing   Problem: Skin Integrity: Goal: Risk for impaired skin integrity will decrease Outcome: Progressing   "

## 2024-05-25 ENCOUNTER — Ambulatory Visit

## 2024-05-25 ENCOUNTER — Other Ambulatory Visit: Payer: Self-pay

## 2024-05-25 DIAGNOSIS — R911 Solitary pulmonary nodule: Secondary | ICD-10-CM | POA: Diagnosis not present

## 2024-05-25 LAB — RAD ONC ARIA SESSION SUMMARY
Course Elapsed Days: 10
Plan Fractions Treated to Date: 9
Plan Prescribed Dose Per Fraction: 3 Gy
Plan Total Fractions Prescribed: 10
Plan Total Prescribed Dose: 30 Gy
Reference Point Dosage Given to Date: 27 Gy
Reference Point Session Dosage Given: 3 Gy
Session Number: 9

## 2024-05-25 LAB — GLUCOSE, CAPILLARY
Glucose-Capillary: 155 mg/dL — ABNORMAL HIGH (ref 70–99)
Glucose-Capillary: 247 mg/dL — ABNORMAL HIGH (ref 70–99)
Glucose-Capillary: 268 mg/dL — ABNORMAL HIGH (ref 70–99)
Glucose-Capillary: 219 mg/dL — ABNORMAL HIGH (ref 70–99)

## 2024-05-25 NOTE — Progress Notes (Signed)
 PT Cancellation Note  Patient Details Name: Sonya Solomon MRN: [id number] DOB: [birth year]   Cancelled Treatment:    Reason Eval/Treat Not Completed: Patient at procedure or test/unavailable   Ferman Basilio Kerstine 05/25/2024, 9:40 AM

## 2024-05-25 NOTE — Progress Notes (Signed)
 Patient expressed frustration this AM in regards to her care. RN had made all of her needs known from repositioning her in bed, ordered he breakfast, giving her anxiety medication prior to her treatment. RN also asked for patient to please use the call light to call for further assistance if needed, but patient states that I am not going to call you if you are busy. RN emphasized that there are other staffs member that will be able to help if I am unavailable. RN stayed at the bedside and allow patient to expressed her concerns and make all her needs met. Patient states she doesn't need anything right now and will let RN knows if she needs anything else.

## 2024-05-25 NOTE — Progress Notes (Signed)
 RN and NT offered to order patient dinner and she declined. Patient said she had a huge lunch and wants to eat jello at a later time. Insulin  meal coverage held due to now meeting meal percentage.

## 2024-05-25 NOTE — Progress Notes (Signed)
 RN assisted patient with lunch order for 1125AM. She said she needs nothing else at this time.

## 2024-05-25 NOTE — Progress Notes (Signed)
 Patient left unit for treatment at this time.

## 2024-05-25 NOTE — Progress Notes (Signed)
 " PROGRESS NOTE    Sonya Solomon  FMW:[id number] DOB: May 25, 1945 DOA: 04/30/2024 PCP: Leonel Cole, MD   Brief Narrative: 79 y.o. female with PMH significant for DM2, HTN, HLD, CKD 3A, obesity, MS, gait disturbance, vertigo, urinary urgency, spasticity, insomnia Lives at home with her husband. 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 Currently getting radiation treatment, planned till 05/26/2024 She has been essentially bedbound because of her weakness and refusal to participate with physical therapy.  She has been told that she will close the chance of SNF approval if she continues to refuse to work with PT. 1/19: Palliative care was also consulted pending meeting. TOC started the workup for SNF 1/20: Had her radiation treatment today, palliative meeting set for upcoming Saturday.  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  CT head was obtained which showed multiple intracranial lesions suggestive of metastatic disease with vasogenic edema, local mass effect Follow-up MRI brain was obtained which confirmed multiple enhancing nodules within the cerebral cerebellar hemisphere bilaterally suspicious for metastatic disease. 1/4 CT chest showed 18  x 14 mm irregular nodular density in the right middle lobe.  Seen by pulmonary 1/6, underwent flexible video fiberoptic bronchoscopy with robotic assistance and biopsies.  Biopsy report showed adenocarcinoma of lung. Medical oncology, radiation oncology and palliative care consulted. 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 Currently on Keppra  750 mg twice daily and Decadron  4 mg every 8 hours,1/3. Palliative to meet with patient and husband on Saturday   Acute pancolitis Initially presented with diarrhea dehydration CT scan on admission showed pancolitis Diarrhea resolved Currently on bowel regimen with senna and MiraLAX     Hypotension H/o hypertension Hypotension resolved with fluids, midodrine  and Solu-Cortef  Lisinopril  on hold.  Blood pressure currently within goal. Currently also on DDAVP  0.1 mg nightly which is her home dose.   AKI on CKD 3A -resolved Baseline creatinine 1.1.  Presented with creatinine elevated to 5.4 due to diarrhea dehydration.  Renal ultrasound without any obstruction. Creatinine now back to baseline with IV fluid. Monitor renal function Avoid nephrotoxins   Hyperkalemia .  Potassium improved to 4.8. -Continue daily Lokelma  unless potassium goes below 4 -Monitor potassium   Severe hypomagnesemia 1/13, magnesium  level is significantly low at less than 0.5.  Improved with replacement. 1/18: Magnesium  of 1.9   Hyponatremia Sodium at 129 on 1/19.  Patient might have some element of SIADH with history of  lung cancer and mets. Salt tablets were added on 1/18 - Continue salt tablets salt tablet - Continue to monitor -na 131  E. coli UTI, POA   completed treatment with Rocephin .    Acute urinary retention Foley catheter was inserted in the ED. because of profound weakness and immobility, Foley catheter was continued. She had leakage with Foley catheter on 1/13 and it was removed.  Started on on PureWick.    Demand ischemia with NSVT SVT- Troponin trend relatively flat 126, 141, 169 Echocardiogram negative for regional wall motion abnormality, EF 70 to 75%, grade 1 diastolic dysfunction, elevated pulmonary artery systolic pressure, LV hypertrophy severe?     Cardiology recommend outpatient follow-up   Seizure disorder Continue Keppra     Multiple sclerosis followed by Dr. Vear neurology as an outpatient.   Seen by PT OT earlier in the course and recommended SNF. Reports that 3 weeks prior to presentation, she was independent.  Gradually declining physically.   -PT is recommending SNF -Encouraged to work with PT    Type 2 diabetes mellitus  Hyperglycemia due to steroids A1c controlled at 7 on 04/30/2024 PTA meds-metformin , Actos , Ozempic  Currently on Lantus  15 units daily, scheduled Premeal aspart 6 units 3 times daily, SSI with Accu-Cheks.     Hyperlipidemia  continue Zocor    Anxiety/depression Xanax  0.25 mg 3 times daily as needed    Estimated body mass index is 33.38 kg/m as calculated from the following:   Height as of this encounter: 5' 4.5 (1.638 m).   Weight as of this encounter: 89.6 kg.  DVT prophylaxis:scd Code Status:full Family Communication:none Disposition Plan:  Status is: Inpatient   Consultants: Oncology, neurology, cardiology, PCCM, palliative care  Procedures: Bronch and biopsy Antimicrobials: None  Subjective:  Planes of decreased hearing in both ears autoscope shows no evidence of inflammation or infection or wax buildup   Objective: Vitals:   05/24/24 1938 05/25/24 0544 05/25/24 1002 05/25/24 1137  BP: 112/64 113/65 118/62 102/79  Pulse: (!) 48 73 64 61  Resp: 14 15 16 18    Temp: (!) 97.3 F (36.3 C) 97.6 F (36.4 C) 98.3 F (36.8 C) 98 F (36.7 C)  TempSrc: Oral Oral Oral   SpO2: 98% 97% 99% 100%  Weight:      Height:        Intake/Output Summary (Last 24 hours) at 05/25/2024 1150 Last data filed at 05/25/2024 1145 Gross per 24 hour  Intake 960 ml  Output 400 ml  Net 560 ml   Filed Weights   04/30/24 2001 04/30/24 2015  Weight: 89.6 kg 89.6 kg    Examination:  General exam: Appears in no acute distress Respiratory system: Clear to auscultation. Respiratory effort normal. Cardiovascular system: reg. Gastrointestinal system: Abdomen is nondistended, soft and nontender. No organomegaly or masses felt. Normal bowel sounds heard. Central nervous system: Alert and oriented.  Extremities:edema b/l   Data Reviewed: I have personally reviewed following labs and imaging studies  CBC: Recent Labs  Lab 05/20/24 0551 05/24/24 0601  WBC 8.9 14.0*  NEUTROABS 7.6  --   HGB 12.8 13.2  HCT 38.2 39.8  MCV 93.2 94.8  PLT 250 210   Basic Metabolic Panel: Recent Labs  Lab 05/20/24 0551 05/21/24 0550 05/22/24 0500 05/24/24 0601  NA 132* 127* 129* 131*  K 5.3* 4.8 4.7 5.1  CL 97* 94* 96* 99  CO2 27 25 23 24   GLUCOSE 144* 216* 169* 212*  BUN 33* 33* 34* 32*  CREATININE 0.86 0.80  0.80 0.84  CALCIUM 8.9 8.8* 8.7* 8.6*  MG  --  1.9 1.9  --   PHOS  --  3.3 3.1 3.0   GFR: Estimated Creatinine Clearance: 62.4 mL/min (by C-G formula based on SCr of 0.84 mg/dL). Liver Function Tests: Recent Labs  Lab 05/21/24 0550 05/22/24 0500 05/24/24 0601  ALBUMIN  3.2* 3.1* 2.9*   No results for input(s): LIPASE, AMYLASE in the last 168 hours. No results for input(s): AMMONIA in the last 168 hours. Coagulation Profile: No results for input(s): INR, PROTIME in the last 168 hours. Cardiac Enzymes: No results for input(s): CKTOTAL, CKMB, CKMBINDEX, TROPONINI in the last 168 hours. BNP (last 3 results) No results for input(s): PROBNP  in the last 8760 hours. HbA1C: No results for input(s): HGBA1C in the last 72 hours. CBG: Recent Labs  Lab 05/24/24 1204 05/24/24 1630 05/24/24 2049 05/25/24 0721 05/25/24 1135  GLUCAP 305* 179* 155* 155* 247*   Lipid Profile: No results for input(s): CHOL, HDL, LDLCALC, TRIG, CHOLHDL, LDLDIRECT in the last 72 hours. Thyroid  Function Tests: No results for input(s): TSH, T4TOTAL, FREET4, T3FREE, THYROIDAB in the last 72 hours. Anemia Panel: No results for input(s): VITAMINB12, FOLATE, FERRITIN, TIBC, IRON, RETICCTPCT in the last 72 hours. Sepsis Labs: No results for input(s): PROCALCITON, LATICACIDVEN in the last 168 hours.  No results found for this or any previous visit (from the past 240 hours).     Radiology Studies: No results found.   Scheduled Meds:  acetaminophen   1,000 mg Oral TID   desmopressin   0.1 mg Oral QHS   dexamethasone   4 mg Oral Q8H   feeding supplement  237 mL Oral BID BM   insulin  aspart  0-5 Units Subcutaneous QHS   insulin  aspart  0-6 Units Subcutaneous TID WC   insulin  aspart  6 Units Subcutaneous TID WC   insulin  glargine-yfgn  15 Units Subcutaneous Daily   levETIRAcetam   750 mg Oral BID   pantoprazole   40 mg Oral Daily   polyethylene glycol  17 g Oral BID   simvastatin   40 mg Oral q1800   sodium chloride   1 g Oral BID WC   Continuous Infusions:   LOS: 25 days    Almarie KANDICE Hoots, MD 05/25/2024, 11:50 AM   "

## 2024-05-25 NOTE — Progress Notes (Signed)
 Pain medications given. RN offered to reposition and getting pt OOB. Patient states that she is fine at this time.

## 2024-05-25 NOTE — Progress Notes (Addendum)
 Patient returned from treatment at this time, b/p118/62 (75), HR 64, RR 18, 99% RA, 98.3 roal temp. Patient denied any pain or discomfort. Purwick replaced as requested.  RN offered to repositioned and asked if she needs anything else, and she said no at this time.

## 2024-05-25 NOTE — Plan of Care (Signed)
   Problem: Coping: Goal: Ability to adjust to condition or change in health will improve Outcome: Progressing

## 2024-05-26 ENCOUNTER — Other Ambulatory Visit (HOSPITAL_COMMUNITY): Payer: Self-pay

## 2024-05-26 ENCOUNTER — Ambulatory Visit

## 2024-05-26 ENCOUNTER — Encounter (HOSPITAL_COMMUNITY): Payer: Self-pay

## 2024-05-26 ENCOUNTER — Other Ambulatory Visit: Payer: Self-pay

## 2024-05-26 DIAGNOSIS — R911 Solitary pulmonary nodule: Secondary | ICD-10-CM | POA: Diagnosis not present

## 2024-05-26 LAB — GLUCOSE, CAPILLARY
Glucose-Capillary: 170 mg/dL — ABNORMAL HIGH (ref 70–99)
Glucose-Capillary: 342 mg/dL — ABNORMAL HIGH (ref 70–99)
Glucose-Capillary: 294 mg/dL — ABNORMAL HIGH (ref 70–99)

## 2024-05-26 LAB — RAD ONC ARIA SESSION SUMMARY
Course Elapsed Days: 11
Plan Fractions Treated to Date: 10
Plan Prescribed Dose Per Fraction: 3 Gy
Plan Total Fractions Prescribed: 10
Plan Total Prescribed Dose: 30 Gy
Reference Point Dosage Given to Date: 30 Gy
Reference Point Session Dosage Given: 3 Gy
Session Number: 10

## 2024-05-26 MED ORDER — ACETAMINOPHEN 500 MG PO TABS
1000.0000 mg | ORAL_TABLET | Freq: Three times a day (TID) | ORAL | 0 refills | Status: AC
  Filled 2024-05-26: qty 30, 5d supply, fill #0

## 2024-05-26 MED ORDER — POLYETHYLENE GLYCOL 3350 17 G PO PACK
17.0000 g | PACK | Freq: Two times a day (BID) | ORAL | 0 refills | Status: AC
  Filled 2024-05-26: qty 14, 7d supply, fill #0

## 2024-05-26 MED ORDER — OXYCODONE HCL 5 MG PO TABS: 5.0000 mg | ORAL_TABLET | Freq: Four times a day (QID) | ORAL | 0 refills | Status: DC | PRN

## 2024-05-26 MED ORDER — SODIUM CHLORIDE 1 G PO TABS: 1.0000 g | ORAL_TABLET | Freq: Two times a day (BID) | ORAL | Status: AC

## 2024-05-26 MED ORDER — INSULIN GLARGINE-YFGN 100 UNIT/ML ~~LOC~~ SOLN
20.0000 [IU] | Freq: Every day | SUBCUTANEOUS | 11 refills | Status: AC
  Filled 2024-05-26: qty 10, 50d supply, fill #0

## 2024-05-26 MED ORDER — ALPRAZOLAM 0.25 MG PO TABS: 0.2500 mg | ORAL_TABLET | Freq: Three times a day (TID) | ORAL | 0 refills | Status: DC | PRN

## 2024-05-26 MED ORDER — MELATONIN 5 MG PO TABS: 5.0000 mg | ORAL_TABLET | Freq: Every evening | ORAL | Status: AC | PRN

## 2024-05-26 MED ORDER — DEXAMETHASONE 4 MG PO TABS
4.0000 mg | ORAL_TABLET | Freq: Two times a day (BID) | ORAL | 0 refills | Status: AC
  Filled 2024-05-26: qty 60, 30d supply, fill #0

## 2024-05-26 MED ORDER — INSULIN ASPART 100 UNIT/ML IJ SOLN
6.0000 [IU] | Freq: Three times a day (TID) | INTRAMUSCULAR | 11 refills | Status: AC
  Filled 2024-05-26: qty 10, 56d supply, fill #0

## 2024-05-26 NOTE — Discharge Summary (Signed)
 Physician Discharge Summary  Sonya Solomon FMW:[id number] DOB: 1945/06/15 DOA: 04/30/2024  PCP: Leonel Cole, MD  Admit date: 04/30/2024 Discharge date: 05/26/2024  Admitted From: Home Disposition: Skilled nursing facility  Recommendations for Outpatient Follow-up:  Follow up with PCP in 1-2 weeks Please obtain BMP/CBC in one week  Home Health: No none Equipment/Devices: None Discharge Condition: Stable CODE STATUS: Full code Diet recommendation: Cardiac Brief/Interim Summary: 79 y.o. female with PMH significant for DM2, HTN, HLD, CKD 3A, obesity, MS, gait disturbance, vertigo, urinary urgency, spasticity, insomnia Lives at home with her husband. At baseline, walks with a walker and cane and independent with ADLs  12/28, patient presented to ED after a fall, diarrhea She was found to be very dehydrated with creatinine significantly elevated to 5.4 CT abdomen pelvis showed pancolitis  Hospital course was complicated by new diagnosis of brain mets from lung primary.  Patient received radiation treatments to her brain till 05/25/2024.  She finished that course of radiation.  Seen by for PT recommending SNF.  Discharge Diagnoses:  Principal Problem:   Pulmonary nodule 1 cm or greater in diameter Active Problems:   MS (multiple sclerosis)   Hyperlipidemia associated with type 2 diabetes mellitus (HCC)   AKI (acute kidney injury)   CKD stage 3a, GFR 45-59 ml/min (HCC)   Hyperkalemia   Demand ischemia (HCC)   Pyuria   Diarrhea   Seizure (HCC)   HTN (hypertension)   Palliative care by specialist    Adenocarcinoma lung with multiple brain mets he was admitted with complaints of sudden onset of severe headache and diplopia. CT head was obtained which showed multiple intracranial lesions suggestive of metastatic disease with vasogenic edema, local mass effect Follow-up MRI brain was obtained which confirmed multiple enhancing nodules within the cerebral cerebellar hemisphere  bilaterally suspicious for metastatic disease. 1/4 CT chest showed 18 x 14 mm irregular nodular density in the right middle lobe.  She was seen by pulmonary and underwent  flexible video fiberoptic bronchoscopy with robotic assistance and biopsies.  Biopsy report showed adenocarcinoma of lung. Medical oncology, radiation oncology and palliative care consulted. 1/10, MRI brain showed the same findings as previous MRI from 1/3 with no new lesions 1/10, MRI total spine did not show any evidence of metastasis but showed a minimally increased in size 12 mm enhancing intradural, extramedullary mass at T11, possibly meningioma, compared to MRI from 02/17/2024 1/12, started on radiation treatment, to last till 1/23 During her hospital stay she also had complaints of decreased hearing in both of her ears she did not have any impacted wax or evidence of otitis media believe this is likely related to her mets that she has in the brain. Continue Keppra  and Decadron . Follow-up with radiation oncology.   Acute pancolitis Initially presented with diarrhea dehydration CT scan on admission showed pancolitis Diarrhea improved Currently on bowel regimen with senna and MiraLAX     Hypotension H/o hypertension Hypotension resolved with fluids, midodrine  and Solu-Cortef  Lisinopril  on hold.  Blood pressure currently within goal. Currently also on DDAVP  0.1 mg nightly which is her home dose.   AKI on CKD 3A -resolved Baseline creatinine 1.1.  Presented with creatinine elevated to 5.4 due to diarrhea dehydration.  Renal ultrasound without any obstruction. Creatinine now back to baseline with IV fluid.   Hyperkalemia .  Potassium improved to 4.8 with Lokelma .  Severe hypomagnesemia resolved.   Hyponatremia stable sodium 131 on discharge.   E. coli UTI, POA  completed treatment with Rocephin .  Acute urinary retention Foley catheter was inserted in the ED. because of profound weakness and immobility, Foley  catheter was continued. She had leakage with Foley catheter on 1/13 and it was removed.  Started on on PureWick.    Demand ischemia with NSVT SVT- Troponin trend relatively flat 126, 141, 169 Echocardiogram negative for regional wall motion abnormality, EF 70 to 75%, grade 1 diastolic dysfunction, elevated pulmonary artery systolic pressure, LV hypertrophy severe?     Cardiology recommend outpatient follow-up   Seizure disorder Continue Keppra     Multiple sclerosis followed by Dr. Vear neurology as an outpatient.   Seen by PT OT earlier in the course and recommended SNF. Reports that 3 weeks prior to presentation, she was independent.  Gradually declining physically.   -PT is recommending SNF -Encouraged to work with PT    Type 2 diabetes mellitus  Hyperglycemia due to steroids continue Lantus  and short-acting insulin .  Prior to admission she was on metformin  Actos  and Ozempic .  She has not received these during the hospital stay.   Hyperlipidemia  continue Zocor    Anxiety/depression Xanax  0.25 mg 3 times daily as needed     Estimated body mass index is 33.38 kg/m as calculated from the following:   Height as of this encounter: 5' 4.5 (1.638 m).   Weight as of this encounter: 89.6 kg.  Discharge Instructions  Discharge Instructions     Increase activity slowly   Complete by: As directed    Increase activity slowly   Complete by: As directed       Allergies as of 05/26/2024       Reactions   Latex Rash        Medication List     STOP taking these medications    doxepin  10 MG capsule Commonly known as: SINEQUAN    HYDROcodone -acetaminophen  7.5-325 MG tablet Commonly known as: NORCO   loperamide  2 MG capsule Commonly known as: IMODIUM    LORazepam  1 MG tablet Commonly known as: ATIVAN    metFORMIN  1000 MG tablet Commonly known as: GLUCOPHAGE    Ozempic  (1 MG/DOSE) 2 MG/1.5ML Sopn Generic drug: Semaglutide  (1 MG/DOSE)   pioglitazone  45 MG  tablet Commonly known as: ACTOS    tiZANidine  2 MG tablet Commonly known as: ZANAFLEX        TAKE these medications    acetaminophen  500 MG tablet Commonly known as: TYLENOL  Take 2 tablets (1,000 mg total) by mouth 3 (three) times daily.   ALPRAZolam  0.25 MG tablet Commonly known as: XANAX  Take 1 tablet (0.25 mg total) by mouth 3 (three) times daily as needed for anxiety.   desmopressin  0.1 MG tablet Commonly known as: DDAVP  TAKE 1 TABLET BY MOUTH AT BEDTIME.   dexamethasone  4 MG tablet Commonly known as: DECADRON  Take 1 tablet (4 mg total) by mouth 2 (two) times daily.   insulin  aspart 100 UNIT/ML injection Commonly known as: novoLOG  Inject 6 Units into the skin 3 (three) times daily with meals.   insulin  glargine-yfgn 100 UNIT/ML injection Commonly known as: SEMGLEE  Inject 0.2 mLs (20 Units total) into the skin daily. Start taking on: May 27, 2024   levETIRAcetam  750 MG tablet Commonly known as: KEPPRA  Take 1 tablet (750 mg total) by mouth 2 (two) times daily.   lisinopril  10 MG tablet Commonly known as: ZESTRIL  TAKE 1 TABLET(10 MG) BY MOUTH DAILY   melatonin 5 MG Tabs Take 1 tablet (5 mg total) by mouth at bedtime as needed.   omeprazole 20 MG capsule Commonly known as: PRILOSEC  Take 20 mg by mouth daily.   oxyCODONE  5 MG immediate release tablet Commonly known as: Oxy IR/ROXICODONE  Take 1 tablet (5 mg total) by mouth every 6 (six) hours as needed for moderate pain (pain score 4-6).   polyethylene glycol 17 g packet Commonly known as: MIRALAX  / GLYCOLAX  Take 17 g by mouth 2 (two) times daily.   simvastatin  40 MG tablet Commonly known as: ZOCOR  TAKE 1 TABLET(40 MG) BY MOUTH DAILY   sodium chloride  1 g tablet Take 1 tablet (1 g total) by mouth 2 (two) times daily with a meal.        Contact information for follow-up providers     Floretta Mallard, MD Follow up on 06/13/2024.   Specialty: Cardiology Why: 10:20AM. Post hospital cardiology  follow up Contact information: [address] [place] [address] [phone]              Contact information for after-discharge care     Destination     [facility] .   Service: Skilled Nursing Contact information: [address] [place]  [phone]                    Allergies[1]  Consultations: Oncology IR PCCM Radiation onc   Procedures/Studies: MR TOTAL SPINE METS SCREENING Result Date: 05/13/2024 EXAM: MRI CERVICAL, THORACIC, AND LUMBAR SPINE WITH AND WITHOUT CONTRAST 05/13/2024 04:01:11 PM TECHNIQUE: Multiplanar multisequence MRI of the cervical, thoracic, and lumbar spine was performed without and with the administration of 9 mL of gadobutrol  (GADAVIST ) 1 MMOL/ML intravenous contrast. COMPARISON: MRI cervical and thoracic spine without contrast 02/17/2024. CT chest 05/07/2024. CT abdomen and pelvis 04/30/2024. CLINICAL HISTORY: Malignancy (HCC). Newly diagnosed lung cancer and presumed brain metastases. FINDINGS: BONES AND ALIGNMENT: Normal alignment. No fracture, suspicious marrow lesion, or significant marrow edema. SPINAL CORD: A 12 x 9 x 7 mm intradural, extramedullary mass in the left lateral aspect of the spinal canal at T11 demonstrates homogeneous enhancement and is stable to minimally larger than on the prior thoracic spine MRI. This is partially calcified on CT. There is mild mass effect on the left aspect of the spinal cord without cord edema. SOFT TISSUES: Bilateral pleural effusions. Left adrenal nodule, more fully evaluated on recent CT. DISC LEVELS: Focally advanced disc degeneration in the cervical spine at C5-C6 with disc bulging resulting in mild to moderate spinal stenosis, more fully evaluated on the previous cervical spine MRI which included axial images through this region. Mild thoracic and lumbar spondylosis without evidence of significant spinal stenosis. IMPRESSION:  1. 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 2. No definite evidence of metastatic disease in the cervical, thoracic, or lumbar spine. Electronically signed by: Dasie Hamburg MD MD 05/13/2024 04:59 PM EST RP Workstation: [id number]   MR BRAIN W WO CONTRAST Result Date: 05/13/2024 EXAM: MRI BRAIN WITH AND WITHOUT CONTRAST 05/13/2024 04:01:11 PM TECHNIQUE: Multiplanar multisequence MRI of the head/brain was performed with and without the administration of intravenous contrast. COMPARISON: MRI head 05/06/2024. CLINICAL HISTORY: New foot drop. Newly diagnosed lung cancer and presumed brain metastases. FINDINGS: LIMITATIONS: The examination is mildly motion degraded. BRAIN AND VENTRICLES: Numerous enhancing lesions are again seen involving the supratentorial and infratentorial brain, estimated at approximately 45 lesions on the prior MRI. Many lesions have minimally enlarged by 1 or 2 mm, with the largest lesion remaining in the left parietal lobe  and now measuring 3.8 x 3.7 cm (previously 3.7 x 3.5 cm). Extensive vasogenic edema throughout both cerebral hemispheres has not significantly changed, including prominent edema in the left basal ganglia and left thalamus extending into the midbrain. A small amount of chronic blood products are associated with some of the lesions. No lesions have substantially decreased in size, and no definite new lesions are identified. Trace rightward midline shift is unchanged. No lesions demonstrate restricted diffusion. No acute infarct, hydrocephalus, or extra-axial fluid collection is identified. There is mild cerebral atrophy. Major intracranial vascular flow voids are preserved. ORBITS: Bilateral cataract extraction. SINUSES: No acute abnormality. BONES AND SOFT TISSUES: Normal bone marrow signal and enhancement. No acute soft tissue abnormality. IMPRESSION: 1. Numerous brain  lesions most consistent with metastases, overall minimally enlarged from 05/06/2024. No new lesions. 2. Unchanged extensive bilateral cerebral vasogenic edema. Electronically signed by: Dasie Hamburg MD MD 05/13/2024 04:40 PM EST RP Workstation: [id number]   DG Chest Port 1 View Result Date: 05/09/2024 CLINICAL DATA:  Post bronchoscopy. EXAM: PORTABLE CHEST 1 VIEW COMPARISON:  Chest x-ray 05/06/2024 and chest CT 05/07/2024 FINDINGS: Lungs are hypoinflated with slight increased opacification over the right base in the region of patient's known right middle lobe nodule likely representing post bronchoscopy changes. Subtle opacification/blunting over the left costophrenic angle likely atelectasis. No pneumothorax. Cardiomediastinal silhouette and remainder of the exam is unchanged. IMPRESSION: Hypoinflation with slight increased opacification over the right base in the region of patient's known right middle lobe nodule likely representing post bronchoscopy changes. No pneumothorax. Electronically Signed   By: Toribio Agreste M.D.   On: 05/09/2024 13:34   DG C-ARM BRONCHOSCOPY Result Date: 05/09/2024 C-ARM BRONCHOSCOPY: Fluoroscopy was utilized by the requesting physician.  No radiographic interpretation.   DG C-Arm 1-60 Min-No Report Result Date: 05/09/2024 Fluoroscopy was utilized by the requesting physician.  No radiographic interpretation.   US  THYROID  Result Date: 05/08/2024 CLINICAL DATA:  Incidental on CT. [id number] Multiple thyroid  nodules [id number] EXAM: THYROID  ULTRASOUND TECHNIQUE: Ultrasound examination of the thyroid  gland and adjacent soft tissues was performed. COMPARISON:  CT chest, 05/07/2024. FINDINGS: Parenchymal Echotexture: Normal Isthmus: 0.5 cm Right lobe: 4.7 x 3.0 x 2.7 cm Left lobe: 3.9 x 2.0 x 1.3 cm _________________________________________________________ Estimated total number of nodules >/= 1 cm: 1 Number of spongiform nodules >/=  2 cm not described below (TR1): 0 Number of mixed cystic and  solid nodules >/= 1.5 cm not described below (TR2): 0 _________________________________________________________ Nodule # 1: Location: RIGHT; Mid Maximum size: 3.3 cm; Other 2 dimensions: 2.5 x 2.5 cm Composition: mixed cystic and solid (1) Echogenicity: anechoic (0) Shape: not taller-than-wide (0) Margins: ill-defined (0) Echogenic foci: none (0) ACR TI-RADS total points: 1. ACR TI-RADS risk category: TR1 (0-1 points). ACR TI-RADS recommendations: This nodule does NOT meet TI-RADS criteria for biopsy or dedicated follow-up. _________________________________________________________ No cervical adenopathy or abnormal fluid collection within the imaged neck. IMPRESSION: Solitary, 3.3 cm RIGHT mid TR-1 thyroid  nodule This nodule does NOT meet TI-RADS criteria for biopsy or dedicated follow-up. The above is in keeping with the ACR TI-RADS recommendations - J Am Coll Radiol 2017;14:587-595. Electronically Signed   By: Thom Hall M.D.   On: 05/08/2024 07:06   CT CHEST WO CONTRAST Result Date: 05/07/2024 CLINICAL DATA:  Intracranial metastases. EXAM: CT CHEST WITHOUT CONTRAST TECHNIQUE: Multidetector CT imaging of the chest was performed following the standard protocol without IV contrast. RADIATION DOSE REDUCTION: This exam was performed according to the departmental dose-optimization program which includes automated  exposure control, adjustment of the mA and/or kV according to patient size and/or use of iterative reconstruction technique. COMPARISON:  Yesterday FINDINGS: Cardiovascular: Aortic atherosclerosis. Mild cardiomegaly. No pericardial effusion. Coronary artery calcifications are noted. Mediastinum/Nodes: 3 cm right thyroid  nodule is noted. Esophagus and trachea are unremarkable. No definite adenopathy is noted, although evaluation is limited due to lack of intravenous contrast. Lungs/Pleura: No pneumothorax is noted. Small bilateral pleural effusions are noted with adjacent subsegmental atelectasis. 18 x 14  mm irregular nodular density is noted laterally in right middle lobe best seen in image number 106 of series 4, concerning for primary pulmonary malignancy. PET scan is recommended for further evaluation. Upper Abdomen: No acute abnormality. Musculoskeletal: No chest wall mass or suspicious bone lesions identified. IMPRESSION: 1. 18 x 14 mm irregular nodular density is noted laterally in right middle lobe concerning for primary pulmonary malignancy. PET scan is recommended for further evaluation. 2. Small bilateral pleural effusions are noted with adjacent subsegmental atelectasis. 3. 3 cm right thyroid  nodule is noted. Recommend thyroid  US . (Ref: J Am Coll Radiol. 2015 Feb;12(2): 143-50). 4. Coronary artery calcifications are noted. 5. Aortic atherosclerosis. Aortic Atherosclerosis (ICD10-I70.0). Electronically Signed   By: Lynwood Landy Raddle M.D.   On: 05/07/2024 11:06   DG Chest 1 View Result Date: 05/06/2024 EXAM: 1 VIEW(S) XRAY OF THE CHEST 05/06/2024 09:00:00 AM COMPARISON: 05/04/2024 CLINICAL HISTORY: SOB (shortness of breath) FINDINGS: LUNGS AND PLEURA: Decreased interstitial edema compared with the previous exam. No focal pulmonary opacity. No pleural effusion. No pneumothorax. HEART AND MEDIASTINUM: Cardiomegaly. Aortic arch atherosclerosis. BONES AND SOFT TISSUES: No acute osseous abnormality. IMPRESSION: 1. No acute findings. 2. Resolved interstitial edema compared with the previous exam. 3. Cardiomegaly. Electronically signed by: Waddell Calk MD 05/06/2024 12:55 PM EST RP Workstation: [id number]   MR BRAIN W WO CONTRAST Result Date: 05/06/2024 EXAM: MRI BRAIN WITH AND WITHOUT CONTRAST 05/06/2024 08:57:23 AM TECHNIQUE: Multiplanar multisequence MRI of the head/brain was performed with and without the administration of 9 mL gadobutrol  (GADAVIST ) 1 MMOL/ML injection. COMPARISON: MRI of the head dated 04/20/2020 and CT of the head dated 05/05/2024. CLINICAL HISTORY: Diplopia. FINDINGS: BRAIN AND  VENTRICLES: No acute infarct. No acute intracranial hemorrhage. No mass effect or midline shift. No hydrocephalus. The sella is unremarkable. Normal flow voids. There are numerous enhancing nodules and ring enhancing lesions within the cerebral and cerebellar hemispheres bilaterally, as demonstrated on the previous CT, which are highly suspicious for metastatic disease. The largest lesion is situated within the left parietal lobe, measuring approximately 3.2 x 2.0 x 3.6 cm. There are approximately 45 enhancing lesions within the cerebral and cerebellar hemispheres. There is a lesion within the left thalamus and left cerebral peduncle seen on image 16 of series 18, measuring approximately 14 x 11 x 14 mm. There is also a lesion within the left basal ganglia measuring approximately 15 x 12 x 11 mm. None of the lesions demonstrate restricted diffusion. There is extensive cerebral white matter disease present. ORBITS: No acute abnormality. SINUSES: No acute abnormality. BONES AND SOFT TISSUES: Normal bone marrow signal and enhancement. No acute soft tissue abnormality. IMPRESSION: 1. Numerous (approximately 45) enhancing nodules and ring enhancing lesions within the cerebral and cerebellar hemispheres bilaterally, highly suspicious for metastatic disease, with the largest lesion in the left parietal lobe measuring approximately 3.2 x 2.0 x 3.6 cm. 2. Extensive cerebral white matter disease. Electronically signed by: Evalene Coho MD 05/06/2024 10:15 AM EST RP Workstation: [id number]   CT HEAD WO CONTRAST ( )  Result Date: 05/05/2024 EXAM: CT HEAD WITHOUT CONTRAST 05/05/2024 08:11:00 PM TECHNIQUE: CT of the head was performed without the administration of intravenous contrast. Automated exposure control, iterative reconstruction, and/or weight based adjustment of the mA/kV was utilized to reduce the radiation dose to as low as reasonably achievable. COMPARISON: MRI head 04/20/2020. CLINICAL HISTORY: Diplopia;  Headache, sudden, severe. FINDINGS: BRAIN AND VENTRICLES: No acute hemorrhage. No evidence of acute infarct. No hydrocephalus. No extra-axial collection. No midline shift. Local mass effect is present associated with the dominant left parietal lesion. Multiple somewhat heterogeneous hyperattenuating lesions throughout the cerebral hemispheres concerning for intracranial metastatic disease. Dominant lesion in the high left parietal lobe near the vertex measures 2.6 x 2.1 x 2.9 cm. There is surrounding vasogenic edema and local mass effect. Additional 1.2 cm lesion in the right occipital lobe (series 2, image 14). 1.0 cm lesion in the left cerebral peduncle with edema in the midbrain also extending into the left thalamus. There is a 1.4 cm hyperattenuating lesion in the right parietal lobe with possible adjacent satellite lesion and associated vasogenic edema. Additional lesions noted in the left frontal lobe and in the left basal ganglia. There are additional areas of hypoattenuation in the supratentorial white matter which may reflect a combination of chronic microvascular disease as well as additional areas of vasogenic edema. MRI of the brain with and without contrast is recommended for further evaluation of intracranial lesions. ORBITS: Bilateral lens replacement noted. SINUSES: No acute abnormality. SOFT TISSUES AND SKULL: No acute soft tissue abnormality. No skull fracture. IMPRESSION: 1. 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 Associated vasogenic edema and local mass effect. No midline shift. 2. MRI of the brain with and without contrast is recommended for further evaluation of the intracranial lesions. Electronically signed by: Donnice Mania MD 05/05/2024 08:25 PM EST RP Workstation: [id number]   DG Chest 1 View Result Date: 05/04/2024 CLINICAL DATA:  Shortness of breath. EXAM: CHEST  1 VIEW COMPARISON:  04/30/2024  FINDINGS: Lung volumes are low.The cardiomediastinal contours are stable. Bronchovascular crowding related to low lung volumes. No consolidation, pleural effusion, or pneumothorax. No acute osseous abnormalities are seen. IMPRESSION: Low lung volumes without acute abnormality. Electronically Signed   By: Andrea Gasman M.D.   On: 05/04/2024 14:15   US  RENAL Result Date: 05/04/2024 EXAM: US  Retroperitoneum Complete, Renal. 05/03/2024 03:06:29 PM TECHNIQUE: Real-time ultrasonography of the retroperitoneum renal was performed. COMPARISON: None available CLINICAL HISTORY: Acute kidney injury, ascites. FINDINGS: FINDINGS: RIGHT KIDNEY/URETER: Right kidney measures 11.7 x 5.0 x 5.1 cm. The right renal parenchyma is isoechoic to the liver. No hydronephrosis. No calculus. No mass. LEFT KIDNEY/URETER: Left kidney measures 10.0 x 5.6 x 5.1 cm. The left renal parenchyma is isoechoic to the spleen. No hydronephrosis. No calculus. No mass. BLADDER: A foley catheter is in place. Gallbladder is collapsed. IMPRESSION: 1. The renal parenchyma is somewhat hyperechoic bilaterally. This is nonspecific, but can be seen in the setting of medical renal disease. 2. Foley catheter in situ. Electronically signed by: Lonni Necessary MD 05/04/2024 08:52 AM EST RP Workstation: [id number]   ECHOCARDIOGRAM COMPLETE Result Date: 05/01/2024    ECHOCARDIOGRAM REPORT   Patient Name:   BONNYE HALLE Date of Exam: 05/01/2024 Medical Rec #:  [id number]       Height:       64.5 in Accession #:    [id number]      Weight:       197.5 lb Date of Birth:  08-02-1945        BSA:          1.956 m Patient Age:    78 years        BP:           97/69 mmHg Patient Gender: F               HR:           73 bpm. Exam Location:  Inpatient Procedure: 2D Echo, Cardiac Doppler, Color Doppler and Intracardiac            Opacification Agent (Both Spectral and Color Flow Doppler were            utilized during procedure). Indications:    Elevated troponin   History:        Patient has no prior history of Echocardiogram examinations.                 Risk Factors:Hypertension and Diabetes.  Sonographer:    Merlynn Manas Referring Phys: [id number] TERRY LOISE HURST  Sonographer Comments: Image acquisition challenging due to respiratory motion. IMPRESSIONS  1. Significant LV hypertrophy, especially apically. 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 CMR may be helpful for further evaluation. Left ventricular ejection fraction, by estimation, is 70 to 75%. The left ventricle has hyperdynamic function. The left ventricle has no regional wall motion abnormalities. There is moderate concentric left ventricular hypertrophy. Left ventricular diastolic parameters are consistent with Grade I diastolic dysfunction (impaired relaxation).  2. Right ventricular systolic function is normal. The right ventricular size is normal. Mildly increased right ventricular wall thickness. There is severely elevated pulmonary artery systolic pressure. The estimated right ventricular systolic pressure is 68.0 mmHg.  3. Left atrial size was mildly dilated.  4. The mitral valve is normal in structure. Mild mitral valve regurgitation. No evidence of mitral stenosis.  5. The aortic valve is normal in structure. Aortic valve regurgitation is not visualized. Mild aortic valve stenosis. Aortic valve Vmax measures 2.24 m/s.  6. The inferior vena cava is normal in size with greater than 50% respiratory variability, suggesting right atrial pressure of 3 mmHg. FINDINGS  Left Ventricle: Significant LV hypertrophy, especially apically. 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 CMR may be helpful for further evaluation. Left ventricular ejection fraction, by estimation, is 70 to 75%. The left ventricle has hyperdynamic function. The left ventricle has no regional wall motion abnormalities. Definity  contrast agent was given IV to delineate the left ventricular endocardial borders. The left ventricular  internal cavity size was normal in size. There is moderate concentric left ventricular hypertrophy. Left ventricular diastolic parameters are consistent with Grade I diastolic dysfunction (impaired relaxation). Right Ventricle: The right ventricular size is normal. Mildly increased right ventricular wall thickness. Right ventricular systolic function is normal. There is severely elevated pulmonary artery systolic pressure. The tricuspid regurgitant velocity is 4.03 m/s, and with an assumed right atrial pressure of 3 mmHg, the estimated right ventricular systolic pressure is 68.0 mmHg. Left Atrium: Left atrial size was mildly dilated. Right Atrium: Right atrial size was normal in size. Pericardium: There is no evidence of pericardial effusion. Mitral Valve: The mitral valve is normal in structure. Mild mitral valve regurgitation. No evidence of mitral valve stenosis. Tricuspid Valve: The tricuspid valve is normal in structure. Tricuspid valve regurgitation is mild . No evidence of tricuspid stenosis. Aortic Valve: The aortic valve is normal in structure. Aortic valve regurgitation is not visualized. Mild aortic  stenosis is present. Aortic valve mean gradient measures 10.0 mmHg. Aortic valve peak gradient measures 20.1 mmHg. Aortic valve area, by VTI measures 2.32 cm. Pulmonic Valve: The pulmonic valve was normal in structure. Pulmonic valve regurgitation is not visualized. No evidence of pulmonic stenosis. Aorta: The aortic root is normal in size and structure. Venous: The inferior vena cava is normal in size with greater than 50% respiratory variability, suggesting right atrial pressure of 3 mmHg. IAS/Shunts: No atrial level shunt detected by color flow Doppler.  LEFT VENTRICLE PLAX 2D LVIDd:         5.00 cm   Diastology LVIDs:         2.90 cm   LV e' medial:    4.90 cm/s LV PW:         0.90 cm   LV E/e' medial:  11.9 LV IVS:        0.90 cm   LV e' lateral:   7.18 cm/s LVOT diam:     2.00 cm   LV E/e' lateral: 8.1  LV SV:         99 LV SV Index:   50 LVOT Area:     3.14 cm  RIGHT VENTRICLE             IVC RV Basal diam:  3.70 cm     IVC diam: 1.60 cm RV S prime:     18.60 cm/s TAPSE (M-mode): 2.5 cm      PULMONARY VEINS                             Diastolic Velocity: 51.40 cm/s                             S/D Velocity:       1.40                             Systolic Velocity:  71.10 cm/s LEFT ATRIUM             Index        RIGHT ATRIUM           Index LA diam:        3.90 cm 1.99 cm/m   RA Area:     15.60 cm LA Vol (A2C):   52.0 ml 26.58 ml/m  RA Volume:   42.70 ml  21.82 ml/m LA Vol (A4C):   62.2 ml 31.79 ml/m LA Biplane Vol: 59.4 ml 30.36 ml/m  AORTIC VALVE AV Area (Vmax):    2.64 cm AV Area (Vmean):   2.95 cm AV Area (VTI):     2.32 cm AV Vmax:           224.00 cm/s AV Vmean:          148.000 cm/s AV VTI:            0.425 m AV Peak Grad:      20.1 mmHg AV Mean Grad:      10.0 mmHg LVOT Vmax:         188.00 cm/s LVOT Vmean:        139.000 cm/s LVOT VTI:          0.314 m LVOT/AV VTI ratio: 0.74  AORTA Ao Root diam: 3.50 cm Ao Asc diam:  3.80 cm MITRAL VALVE  TRICUSPID VALVE MV Area (PHT): 3.14 cm    TR Peak grad:   65.0 mmHg MV E velocity: 58.10 cm/s  TR Vmax:        403.00 cm/s MV A velocity: 85.70 cm/s MV E/A ratio:  0.68        SHUNTS                            Systemic VTI:  0.31 m                            Systemic Diam: 2.00 cm Morene Brownie Electronically signed by Morene Brownie Signature Date/Time: 05/01/2024/1:42:17 PM    Final    CT ABDOMEN PELVIS WO CONTRAST Result Date: 04/30/2024 EXAM: CT ABDOMEN AND PELVIS WITHOUT CONTRAST 04/30/2024 09:16:11 PM TECHNIQUE: CT of the abdomen and pelvis was performed without the administration of intravenous contrast. Multiplanar reformatted images are provided for review. Automated exposure control, iterative reconstruction, and/or weight-based adjustment of the mA/kV was utilized to reduce the radiation dose to as low as reasonably achievable.  COMPARISON: None available. CLINICAL HISTORY: Bowel obstruction suspected; Sepsis. 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 LIVER: The liver is unremarkable. GALLBLADDER AND BILE DUCTS: Gallbladder is not visualized. No biliary ductal dilatation. SPLEEN: No acute abnormality. PANCREAS: No acute abnormality. ADRENAL GLANDS: There is an intramural left adrenal nodule measuring 1.9 x 1.4 cm. KIDNEYS, URETERS AND BLADDER: No stones in the kidneys or ureters. No hydronephrosis. No perinephric or periureteral stranding. Urinary bladder is unremarkable. GI AND BOWEL: Stomach demonstrates no acute abnormality. Sigmoid colon anastomosis is present. There is sigmoid and descending colon diverticulosis. There is circumferential wall thickening of the entire colon more significant proximally. There is mild inflammatory stranding surrounding the ascending colon. The appendix was not definitively visualized. There is no bowel obstruction. PERITONEUM AND RETROPERITONEUM: No ascites. No free air. VASCULATURE: Aorta is normal in caliber. There are atherosclerotic calcifications of the aorta and iliac arteries. LYMPH NODES: No lymphadenopathy. REPRODUCTIVE ORGANS: Uterus is surgically absent. BONES AND SOFT TISSUES: No acute osseous abnormality. No focal soft tissue abnormality. IMPRESSION: 1. Pancolitis, greatest proximally. 2. Sigmoid and descending colon diverticulosis without diverticulitis. 3. Indeterminate 1.9 cm left adrenal nodule; recommend 1-year follow-up adrenal washout CT. 4. Partially imaged intraatrial lipoma. Electronically signed by: Greig Pique MD 04/30/2024 10:06 PM EST RP Workstation: [id number]   DG Chest Port 1 View Result Date: 04/30/2024 EXAM: 1 VIEW(S) XRAY OF THE CHEST 04/30/2024 08:28:32 PM COMPARISON: None available. CLINICAL HISTORY: weakness, fall FINDINGS: LUNGS AND PLEURA: No focal pulmonary opacity. No pleural effusion. No pneumothorax. HEART AND  MEDIASTINUM: Aortic arch calcifications. BONES AND SOFT TISSUES: Multilevel thoracic osteophytosis. No acute osseous abnormality. IMPRESSION: 1. No acute cardiopulmonary abnormality. Electronically signed by: Kate Plummer MD 04/30/2024 09:12 PM EST RP Workstation: [id number]   (Echo, Carotid, EGD, Colonoscopy, ERCP)    Subjective:  No new c/o  Discharge Exam: Vitals:   05/26/24 0359 05/26/24 1054  BP:  133/61  Pulse: (!) 58 (!) 47  Resp:  17  Temp:  (!) 97.5 F (36.4 C)  SpO2: 97% 96%   Vitals:   05/25/24 2110 05/26/24 0345 05/26/24 0359 05/26/24 1054  BP: 125/72 138/70  133/61  Pulse: (!) 50 (!) 49 (!) 58 (!) 47  Resp: 16 10  17   Temp: 97.9 F (36.6 C) 97.8 F (36.6 C)  (!) 97.5 F (36.4  C)  TempSrc: Oral Oral    SpO2: 98% 92% 97% 96%  Weight:      Height:        General: Pt is alert, awake, not in acute distress Cardiovascular: RRR, S1/S2 +, no rubs, no gallops Respiratory: CTA bilaterally, no wheezing, no rhonchi Abdominal: Soft, NT, ND, bowel sounds + Extremities: no edema, no cyanosis    The results of significant diagnostics from this hospitalization (including imaging, microbiology, ancillary and laboratory) are listed below for reference.     Microbiology: No results found for this or any previous visit (from the past 240 hours).   Labs: BNP (last 3 results) No results for input(s): BNP in the last 8760 hours. Basic Metabolic Panel: Recent Labs  Lab 05/20/24 0551 05/21/24 0550 05/22/24 0500 05/24/24 0601  NA 132* 127* 129* 131*  K 5.3* 4.8 4.7 5.1  CL 97* 94* 96* 99  CO2 27 25 23 24   GLUCOSE 144* 216* 169* 212*  BUN 33* 33* 34* 32*  CREATININE 0.86 0.80 0.80 0.84  CALCIUM 8.9 8.8* 8.7* 8.6*  MG  --  1.9 1.9  --   PHOS  --  3.3 3.1 3.0   Liver Function Tests: Recent Labs  Lab 05/21/24 0550 05/22/24 0500 05/24/24 0601  ALBUMIN  3.2* 3.1* 2.9*   No results for input(s): LIPASE, AMYLASE in the last 168 hours. No results for  input(s): AMMONIA in the last 168 hours. CBC: Recent Labs  Lab 05/20/24 0551 05/24/24 0601  WBC 8.9 14.0*  NEUTROABS 7.6  --   HGB 12.8 13.2  HCT 38.2 39.8  MCV 93.2 94.8  PLT 250 210   Cardiac Enzymes: No results for input(s): CKTOTAL, CKMB, CKMBINDEX, TROPONINI in the last 168 hours. BNP: Invalid input(s): POCBNP CBG: Recent Labs  Lab 05/25/24 1135 05/25/24 1622 05/25/24 2207 05/26/24 0756 05/26/24 1143  GLUCAP 247* 268* 219* 170* 342*   D-Dimer No results for input(s): DDIMER in the last 72 hours. Hgb A1c No results for input(s): HGBA1C in the last 72 hours. Lipid Profile No results for input(s): CHOL, HDL, LDLCALC, TRIG, CHOLHDL, LDLDIRECT in the last 72 hours. Thyroid  function studies No results for input(s): TSH, T4TOTAL, T3FREE, THYROIDAB in the last 72 hours.  Invalid input(s): FREET3 Anemia work up No results for input(s): VITAMINB12, FOLATE, FERRITIN, TIBC, IRON, RETICCTPCT in the last 72 hours. Urinalysis    Component Value Date/Time   COLORURINE YELLOW 05/17/2024 1559   APPEARANCEUR HAZY (A) 05/17/2024 1559   LABSPEC 1.029 05/17/2024 1559   PHURINE 5.0 05/17/2024 1559   GLUCOSEU 50 (A) 05/17/2024 1559   HGBUR NEGATIVE 05/17/2024 1559   BILIRUBINUR NEGATIVE 05/17/2024 1559   KETONESUR 5 (A) 05/17/2024 1559   PROTEINUR NEGATIVE 05/17/2024 1559   NITRITE NEGATIVE 05/17/2024 1559   LEUKOCYTESUR LARGE (A) 05/17/2024 1559   Sepsis Labs Recent Labs  Lab 05/20/24 0551 05/24/24 0601  WBC 8.9 14.0*   Microbiology No results found for this or any previous visit (from the past 240 hours).   Time coordinating discharge: 50 min SIGNED:   Almarie KANDICE Hoots, MD  [facility] 05/26/2024, 12:38 PM     [1]  Allergies Allergen Reactions   Latex Rash

## 2024-05-26 NOTE — Progress Notes (Signed)
 Elvie JONETTA Moles to be D/C'd Skilled nursing facility per MD order.  Discussed with the patient and all questions fully answered.  IV catheter discontinued intact. Site without signs and symptoms of complications. Dressing and pressure applied.  An After Visit Summary was printed and placed in discharge packet along with signed prescriptions.  Report called to [facility], receiving RN at [facility].  Patient instructed to return to ED, call 911, or call MD for any changes in condition.   Patient escorted via stretcher, and D/C to [facility] via non emergency ambulance.  Ileana LITTIE Gainer 05/26/2024 2:50 PM

## 2024-05-26 NOTE — TOC Transition Note (Signed)
 Transition of Care [facility]) - Discharge Note   Patient Details  Name: Sonya Solomon MRN: [id number] Date of Birth: 1946-01-12  Transition of Care [facility] CM/SW Contact:  Tawni CHRISTELLA Eva, LCSW Phone Number: 05/26/2024, 2:42 PM   Clinical Narrative:     CSW spoke with the patients husband to discuss SNF bed choice. He deferred the bed choice to the patient. CSW then spoke with the patient, who chose [facility].  CSW spoke with Darrian at [facility], who stated that the pt could discharge today. CSW called the pts spouse to inform him of the discharge plan. The pts spouse expressed concerns about the pt discharging today and requested to speak with the MD regarding his concerns.  1:00 PM CSW spoke with the pts spouse, who continued to have reservations about the pts discharge. CSW informed the spouse that the pt is medically stable and that discharge orders have been placed. CSW advised the spouse that if the pt does not discharge today, she will need to initiate the appeal process and may be liable for the bill. CSW also informed the spouse that the pt may lose her SNF bed also, as the facility is not obligated to hold the bed during the appeal process. The pts husband ultimately agreed to the discharge plan.  The pt is to discharge to [facility], room 1204. RN to call report to [phone]. PTAR called. No further ICM needs at this time. ICM signed off.   Final next level of care: Skilled Nursing Facility Barriers to Discharge: Barriers Resolved   Patient Goals and CMS Choice Patient states their goals for this hospitalization and ongoing recovery are:: SNF to get stonger CMS Medicare.gov Compare Post Acute Care list provided to:: Patient Choice offered to / list presented to : Patient [facility] ownership interest in [facility].provided to:: Patient    Discharge Placement              Patient chooses bed at: [facility] Patient to be transferred to  facility by: EMS Name of family member notified: Spouse, Emergency Contact  [phone] [facility] Phone) Patient and family notified of of transfer: 05/26/24  Discharge Plan and Services Additional resources added to the After Visit Summary for   In-house Referral: NA Discharge Planning Services: CM Consult Post Acute Care Choice: Durable Medical Equipment                               Social Drivers of Health (SDOH) Interventions SDOH Screenings   Food Insecurity: No Food Insecurity (05/01/2024)  Housing: Low Risk (05/01/2024)  Transportation Needs: No Transportation Needs (05/01/2024)  Utilities: Not At Risk (05/01/2024)  Social Connections: Moderately Integrated (05/01/2024)  Tobacco Use: Low Risk (05/09/2024)     Readmission Risk Interventions    05/03/2024    4:12 PM  Readmission Risk Prevention Plan  Transportation Screening Complete  PCP or Specialist Appt within 5-7 Days Complete  Home Care Screening Complete  Medication Review (RN CM) Complete

## 2024-05-26 NOTE — Plan of Care (Signed)
" °  Problem: Education: Goal: Ability to describe self-care measures that may prevent or decrease complications (Diabetes Survival Skills Education) will improve Outcome: Progressing   Problem: Fluid Volume: Goal: Ability to maintain a balanced intake and output will improve Outcome: Progressing   Problem: Education: Goal: Knowledge of General Education information will improve Description: Including pain rating scale, medication(s)/side effects and non-pharmacologic comfort measures Outcome: Progressing   Problem: Elimination: Goal: Will not experience complications related to bowel motility Outcome: Progressing Goal: Will not experience complications related to urinary retention Outcome: Progressing   Problem: Pain Managment: Goal: General experience of comfort will improve and/or be controlled Outcome: Progressing   Problem: Safety: Goal: Ability to remain free from injury will improve Outcome: Progressing   "

## 2024-05-26 NOTE — Progress Notes (Signed)
 PT Cancellation Note  Patient Details Name: Sonya Solomon MRN: [id number] DOB: [birth year]   Cancelled Treatment:    Reason Eval/Treat Not Completed: Patient declined, no reason specified Pt adamantly declining therapy. Attempted at length to educate and encourage but continued to decline.  Pt would just state that it is a bad day, supposed to have family meeting and it keeps getting delayed (RN reports this is scheduled for tomorrow, and always has been), and that my family is more important.  Pt had family member on speaker phone who did not offer input.  Will f/u as able.   [name], PT Acute Rehab [facility] [phone]   [name] Mulberry 05/26/2024, 12:10 PM

## 2024-05-27 NOTE — Plan of Care (Signed)
" °  ° °  Brief palliative note:   PMT had planned to meet with patient and husband on 1/24 at 2 pm. Husband called PMT yesterday with concerns about impending winter weather, so we rescheduled the meeting to today at 2 pm. However, he called back this morning and requested to cancel the meeting.   Discussed with attending Dr. Alvia and TOC. Plan is for patient to discharge today to [facility] for rehab.     Recardo Loll, NP-C Palliative Medicine   Please call Palliative Medicine team phone with any questions [phone]. For individual providers please see AMION.   No charge  "

## 2024-05-29 ENCOUNTER — Ambulatory Visit

## 2024-05-30 ENCOUNTER — Ambulatory Visit

## 2024-05-31 ENCOUNTER — Emergency Department (HOSPITAL_COMMUNITY)

## 2024-05-31 ENCOUNTER — Ambulatory Visit

## 2024-05-31 ENCOUNTER — Other Ambulatory Visit: Payer: Self-pay

## 2024-05-31 ENCOUNTER — Inpatient Hospital Stay (HOSPITAL_COMMUNITY)
Admission: EM | Admit: 2024-05-31 | Discharge: 2024-06-03 | DRG: 602 | Disposition: A | Discharge status: Skilled Nursing Facility | Attending: Internal Medicine | Admitting: Internal Medicine

## 2024-05-31 DIAGNOSIS — Z6833 Body mass index (BMI) 33.0-33.9, adult: Secondary | ICD-10-CM

## 2024-05-31 DIAGNOSIS — Z90710 Acquired absence of both cervix and uterus: Secondary | ICD-10-CM

## 2024-05-31 DIAGNOSIS — D630 Anemia in neoplastic disease: Secondary | ICD-10-CM | POA: Diagnosis present

## 2024-05-31 DIAGNOSIS — C3490 Malignant neoplasm of unspecified part of unspecified bronchus or lung: Secondary | ICD-10-CM | POA: Diagnosis present

## 2024-05-31 DIAGNOSIS — L03312 Cellulitis of back [any part except buttock]: Secondary | ICD-10-CM | POA: Diagnosis present

## 2024-05-31 DIAGNOSIS — M545 Low back pain, unspecified: Secondary | ICD-10-CM

## 2024-05-31 DIAGNOSIS — Z79899 Other long term (current) drug therapy: Secondary | ICD-10-CM

## 2024-05-31 DIAGNOSIS — Z803 Family history of malignant neoplasm of breast: Secondary | ICD-10-CM

## 2024-05-31 DIAGNOSIS — G936 Cerebral edema: Secondary | ICD-10-CM | POA: Diagnosis present

## 2024-05-31 DIAGNOSIS — R29898 Other symptoms and signs involving the musculoskeletal system: Secondary | ICD-10-CM

## 2024-05-31 DIAGNOSIS — E1165 Type 2 diabetes mellitus with hyperglycemia: Secondary | ICD-10-CM | POA: Diagnosis present

## 2024-05-31 DIAGNOSIS — G35D Multiple sclerosis, unspecified: Secondary | ICD-10-CM | POA: Diagnosis present

## 2024-05-31 DIAGNOSIS — E119 Type 2 diabetes mellitus without complications: Secondary | ICD-10-CM

## 2024-05-31 DIAGNOSIS — K219 Gastro-esophageal reflux disease without esophagitis: Secondary | ICD-10-CM | POA: Diagnosis present

## 2024-05-31 DIAGNOSIS — F419 Anxiety disorder, unspecified: Secondary | ICD-10-CM | POA: Diagnosis present

## 2024-05-31 DIAGNOSIS — C7931 Secondary malignant neoplasm of brain: Secondary | ICD-10-CM | POA: Diagnosis present

## 2024-05-31 DIAGNOSIS — Z794 Long term (current) use of insulin: Secondary | ICD-10-CM

## 2024-05-31 DIAGNOSIS — I10 Essential (primary) hypertension: Secondary | ICD-10-CM | POA: Diagnosis present

## 2024-05-31 DIAGNOSIS — L03317 Cellulitis of buttock: Principal | ICD-10-CM | POA: Diagnosis present

## 2024-05-31 DIAGNOSIS — E66811 Obesity, class 1: Secondary | ICD-10-CM | POA: Diagnosis present

## 2024-05-31 DIAGNOSIS — Z8249 Family history of ischemic heart disease and other diseases of the circulatory system: Secondary | ICD-10-CM

## 2024-05-31 DIAGNOSIS — Z66 Do not resuscitate: Secondary | ICD-10-CM | POA: Diagnosis present

## 2024-05-31 DIAGNOSIS — Z91040 Latex allergy status: Secondary | ICD-10-CM

## 2024-05-31 DIAGNOSIS — R200 Anesthesia of skin: Secondary | ICD-10-CM

## 2024-05-31 DIAGNOSIS — E785 Hyperlipidemia, unspecified: Secondary | ICD-10-CM | POA: Diagnosis present

## 2024-05-31 LAB — COMPREHENSIVE METABOLIC PANEL WITH GFR
ALT: 55 U/L — ABNORMAL HIGH (ref 0–44)
AST: 24 U/L (ref 15–41)
Albumin: 2.4 g/dL — ABNORMAL LOW (ref 3.5–5.0)
Alkaline Phosphatase: 300 U/L — ABNORMAL HIGH (ref 38–126)
Anion gap: 11 (ref 5–15)
BUN: 36 mg/dL — ABNORMAL HIGH (ref 8–23)
CO2: 24 mmol/L (ref 22–32)
Calcium: 8.2 mg/dL — ABNORMAL LOW (ref 8.9–10.3)
Chloride: 100 mmol/L (ref 98–111)
Creatinine, Ser: 0.91 mg/dL (ref 0.44–1.00)
GFR, Estimated: >60 mL/min
Glucose, Bld: 270 mg/dL — ABNORMAL HIGH (ref 70–99)
Potassium: 4.5 mmol/L (ref 3.5–5.1)
Sodium: 134 mmol/L — ABNORMAL LOW (ref 135–145)
Total Bilirubin: 0.3 mg/dL (ref 0.0–1.2)
Total Protein: 4.8 g/dL — ABNORMAL LOW (ref 6.5–8.1)

## 2024-05-31 LAB — CBC WITH DIFFERENTIAL/PLATELET
Abs Immature Granulocytes: 0.15 10*3/uL — ABNORMAL HIGH (ref 0.00–0.07)
Basophils Absolute: 0 10*3/uL (ref 0.0–0.1)
Basophils Relative: 0 %
Eosinophils Absolute: 0 10*3/uL (ref 0.0–0.5)
Eosinophils Relative: 0 %
HCT: 31.4 % — ABNORMAL LOW (ref 36.0–46.0)
Hemoglobin: 10.2 g/dL — ABNORMAL LOW (ref 12.0–15.0)
Immature Granulocytes: 1 %
Lymphocytes Relative: 2 %
Lymphs Abs: 0.3 10*3/uL — ABNORMAL LOW (ref 0.7–4.0)
MCH: 31.5 pg (ref 26.0–34.0)
MCHC: 32.5 g/dL (ref 30.0–36.0)
MCV: 96.9 fL (ref 80.0–100.0)
Monocytes Absolute: 0.7 10*3/uL (ref 0.1–1.0)
Monocytes Relative: 4 %
Neutro Abs: 15.5 10*3/uL — ABNORMAL HIGH (ref 1.7–7.7)
Neutrophils Relative %: 93 %
Platelets: 219 10*3/uL (ref 150–400)
RBC: 3.24 MIL/uL — ABNORMAL LOW (ref 3.87–5.11)
RDW: 15.9 % — ABNORMAL HIGH (ref 11.5–15.5)
WBC: 16.6 10*3/uL — ABNORMAL HIGH (ref 4.0–10.5)
nRBC: 0 % (ref 0.0–0.2)

## 2024-05-31 LAB — MAGNESIUM: Magnesium: 1.8 mg/dL (ref 1.7–2.4)

## 2024-05-31 LAB — TSH: TSH: 0.544 u[IU]/mL (ref 0.350–4.500)

## 2024-05-31 MED ORDER — GADOBUTROL 1 MMOL/ML IV SOLN
9.0000 mL | Freq: Once | INTRAVENOUS | Status: AC | PRN
  Administered 2024-05-31: 9 mL via INTRAVENOUS

## 2024-05-31 MED ORDER — HYDROMORPHONE HCL 1 MG/ML IJ SOLN
1.0000 mg | Freq: Once | INTRAMUSCULAR | Status: AC
  Administered 2024-05-31: 1 mg via INTRAVENOUS
  Filled 2024-05-31: qty 1

## 2024-05-31 MED ORDER — FENTANYL CITRATE (PF) 50 MCG/ML IJ SOSY
50.0000 ug | PREFILLED_SYRINGE | Freq: Once | INTRAMUSCULAR | Status: AC
  Administered 2024-05-31: 50 ug via INTRAVENOUS
  Filled 2024-05-31: qty 1

## 2024-05-31 MED ORDER — CEFAZOLIN SODIUM-DEXTROSE 2-4 GM/100ML-% IV SOLN
2.0000 g | Freq: Once | INTRAVENOUS | Status: AC
  Administered 2024-06-01: 2 g via INTRAVENOUS
  Filled 2024-05-31: qty 100

## 2024-05-31 MED ORDER — MORPHINE SULFATE (PF) 4 MG/ML IV SOLN
4.0000 mg | Freq: Once | INTRAVENOUS | Status: AC
  Administered 2024-05-31: 4 mg via INTRAVENOUS
  Filled 2024-05-31: qty 1

## 2024-05-31 MED ORDER — LORAZEPAM 2 MG/ML IJ SOLN: 1.0000 mg | Freq: Once | INTRAMUSCULAR | Status: DC

## 2024-05-31 NOTE — ED Provider Notes (Signed)
 " [place] [facility] Provider Note   CSN: [id number] Arrival date & time: 05/31/24  8380     Patient presents with: No chief complaint on file.   Sonya Solomon is a 79 y.o. female.   The history is provided by the spouse, the patient and medical records. The history is limited by the condition of the patient. No language interpreter was used.  Neurologic Problem This is a new problem. Episode onset: unable to specify. Pertinent negatives include no chest pain, no abdominal pain, no headaches and no shortness of breath. Nothing aggravates the symptoms. Nothing relieves the symptoms. She has tried nothing for the symptoms. The treatment provided no relief.       Prior to Admission medications  Medication Sig Start Date End Date Taking? Authorizing Provider  acetaminophen  (TYLENOL ) 500 MG tablet Take 2 tablets (1,000 mg total) by mouth 3 (three) times daily. 05/26/24   Will Almarie MATSU, MD  ALPRAZolam  (XANAX ) 0.25 MG tablet Take 1 tablet (0.25 mg total) by mouth 3 (three) times daily as needed for anxiety. 05/26/24   Will Almarie MATSU, MD  desmopressin  (DDAVP ) 0.1 MG tablet TAKE 1 TABLET BY MOUTH AT BEDTIME. 05/11/24   Sater, Charlie LABOR, MD  dexamethasone  (DECADRON ) 4 MG tablet Take 1 tablet (4 mg total) by mouth 2 (two) times daily. 05/26/24   Will Almarie MATSU, MD  insulin  aspart (NOVOLOG ) 100 UNIT/ML injection Inject 6 Units into the skin 3 (three) times daily with meals. 05/26/24   Will Almarie MATSU, MD  insulin  glargine-yfgn (SEMGLEE ) 100 UNIT/ML injection Inject 0.2 mLs (20 Units total) into the skin daily. 05/27/24   Will Almarie MATSU, MD  levETIRAcetam  (KEPPRA ) 750 MG tablet Take 1 tablet (750 mg total) by mouth 2 (two) times daily. 03/02/24   Sater, Charlie LABOR, MD  lisinopril  (ZESTRIL ) 10 MG tablet TAKE 1 TABLET(10 MG) BY MOUTH DAILY 07/13/22   Duanne Butler DASEN, MD  melatonin 5 MG TABS Take 1 tablet (5 mg total) by mouth at bedtime as  needed. 05/26/24   Will Almarie MATSU, MD  omeprazole (PRILOSEC) 20 MG capsule Take 20 mg by mouth daily. 07/07/21   [provider]  oxyCODONE  (OXY IR/ROXICODONE ) 5 MG immediate release tablet Take 1 tablet (5 mg total) by mouth every 6 (six) hours as needed for moderate pain (pain score 4-6). 05/26/24   Will Almarie MATSU, MD  polyethylene glycol (MIRALAX  / GLYCOLAX ) 17 g packet Take 17 g by mouth 2 (two) times daily. 05/26/24   Will Almarie MATSU, MD  simvastatin  (ZOCOR ) 40 MG tablet TAKE 1 TABLET(40 MG) BY MOUTH DAILY 08/11/21   Duanne Butler DASEN, MD  sodium chloride  1 g tablet Take 1 tablet (1 g total) by mouth 2 (two) times daily with a meal. 05/26/24   Will Almarie MATSU, MD    Allergies: Latex    Review of Systems  Constitutional:  Negative for chills, fatigue and fever.  HENT:  Negative for congestion.   Eyes:  Negative for visual disturbance.  Respiratory:  Negative for cough, chest tightness, shortness of breath and wheezing.   Cardiovascular:  Negative for chest pain and palpitations.  Gastrointestinal:  Negative for abdominal pain, constipation, diarrhea, nausea and vomiting.  Genitourinary:  Negative for dysuria.  Musculoskeletal:  Positive for back pain. Negative for neck pain and neck stiffness.  Skin:  Negative for rash and wound.  Neurological:  Positive for weakness and numbness. Negative for dizziness, facial asymmetry, speech difficulty,  light-headedness and headaches.  Psychiatric/Behavioral:  Negative for agitation and confusion.   All other systems reviewed and are negative.   Updated Vital Signs BP (!) 140/84   Pulse 72   Temp (!) 97.5 F (36.4 C)   Resp 18   SpO2 98%   Physical Exam Vitals and nursing note reviewed.  Constitutional:      General: She is not in acute distress.    Appearance: She is well-developed. She is not ill-appearing, toxic-appearing or diaphoretic.  HENT:     Head: Normocephalic and atraumatic.     Nose: No congestion  or rhinorrhea.     Mouth/Throat:     Mouth: Mucous membranes are moist.     Pharynx: No oropharyngeal exudate or posterior oropharyngeal erythema.  Eyes:     Extraocular Movements: Extraocular movements intact.     Conjunctiva/sclera: Conjunctivae normal.     Pupils: Pupils are equal, round, and reactive to light.  Cardiovascular:     Rate and Rhythm: Normal rate and regular rhythm.     Heart sounds: No murmur heard. Pulmonary:     Effort: Pulmonary effort is normal. No respiratory distress.     Breath sounds: Normal breath sounds. No wheezing, rhonchi or rales.  Chest:     Chest wall: No tenderness.  Abdominal:     General: Abdomen is flat.     Palpations: Abdomen is soft.     Tenderness: There is no abdominal tenderness. There is no right CVA tenderness, left CVA tenderness, guarding or rebound.  Musculoskeletal:        General: No swelling.     Cervical back: Neck supple. No tenderness.     Comments: Patient has an area of erythema and tenderness on her right buttock.  No fluctuance or induration or drainage seen.  Does not feel like abscess but more likely cellulitis.  Skin:    General: Skin is warm and dry.     Capillary Refill: Capillary refill takes less than 2 seconds.  Neurological:     Mental Status: She is alert.     Sensory: Sensory deficit present.     Motor: Weakness present.  Psychiatric:        Mood and Affect: Mood normal.     (all labs ordered are listed, but only abnormal results are displayed) Labs Reviewed  CBC WITH DIFFERENTIAL/PLATELET - Abnormal; Notable for the following components:      Result Value   WBC 16.6 (*)    RBC 3.24 (*)    Hemoglobin 10.2 (*)    HCT 31.4 (*)    RDW 15.9 (*)    Neutro Abs 15.5 (*)    Lymphs Abs 0.3 (*)    Abs Immature Granulocytes 0.15 (*)    All other components within normal limits  COMPREHENSIVE METABOLIC PANEL WITH GFR - Abnormal; Notable for the following components:   Sodium 134 (*)    Glucose, Bld 270 (*)     BUN 36 (*)    Calcium 8.2 (*)    Total Protein 4.8 (*)    Albumin  2.4 (*)    ALT 55 (*)    Alkaline Phosphatase 300 (*)    All other components within normal limits  TSH  MAGNESIUM   I-STAT CHEM 8, ED    EKG: EKG Interpretation Date/Time:  Wednesday May 31 2024 18:43:11 EST Ventricular Rate:  64 PR Interval:  146 QRS Duration:  70 QT Interval:  446 QTC Calculation: 460 R Axis:   51  Text  Interpretation: Sinus rhythm with occasional Premature ventricular complexes and Premature atrial complexes ST & T wave abnormality, consider inferior ischemia ST & T wave abnormality, consider anterolateral ischemia Abnormal ECG When compared with ECG of 10-May-2024 12:43, PREVIOUS ECG IS PRESENT when compared to prior, similar appearance with less sharp t wave inversionsin leads V2-V5 NO STEMI Confirmed by Ginger Barefoot ([id number]) on 05/31/2024 7:03:40 PM  Radiology: No results found.   Procedures   Medications Ordered in the ED  LORazepam  (ATIVAN ) injection 1 mg (has no administration in time range)  ceFAZolin  (ANCEF ) IVPB 2g/100 mL premix (has no administration in time range)  fentaNYL  (SUBLIMAZE ) injection 50 mcg (50 mcg Intravenous Given 05/31/24 1841)  morphine  (PF) 4 MG/ML injection 4 mg (4 mg Intravenous Given 05/31/24 2125)  HYDROmorphone  (DILAUDID ) injection 1 mg (1 mg Intravenous Given 05/31/24 2312)                                    Medical Decision Making Amount and/or Complexity of Data Reviewed Labs: ordered. Radiology: ordered.  Risk Prescription drug management.    Sonya Solomon is a 79 y.o. female with past medical history significant for MS, diabetes, vertigo, hyperlipidemia, hypertension, and lung cancer with brain metastasis who was recently discharged in the hospital 5 days ago who presents with new right leg numbness and weakness.  According to patient, she thinks she may has had this for a while however the facility and her husband say that this is new  numbness and weakness in her right leg.  They are unsure when it began.  Patient says she is having pain in her bottom and very low back that she thinks could be infection or a boil.  Patient otherwise denies fevers, chills, congestion, cough, nausea, vomiting, constipation, diarrhea, or urinary changes.  She is denying any headache or neck pain at this time.  She denies pain in her mid back but has the discomfort in her very low back.  She denies pain down the leg.  She denies any trauma or falls since her initial 1 that led to her cancer discovery.  On my initial evaluation, lungs are clear.  Chest is nontender.  Abdomen nontender.  Upper and mid back nontender.  She did not have tenderness in her low back for me but was having more pain near her bottom.  Will get a chaperone to assess this area as she is on a hall bed.  She had symmetric strength in her upper extremities and had intact sensation.  Symmetric mild.  Clear speech.  Pupil symmetric and reactive with normal extraocular moods.  Intact sensation of the face.  Patient's right leg had significant numbness and weakness compared to the left although the left could barely raise against gravity.  Right could not do so.  Had a shared decision-making conversation as she says this is not new however given the facility and her husband saying this is new, we will do workup.  Her oncology team  had documented that if she develop new leg symptoms she may need imaging of her back again.  We will get MRI brain with and without contrast and MRI of her T and L-spine with and without contrast to look for new metastasis or other acute abnormality.  Will give her some pain medicine.  Will check some labs as well to look for electrolyte disturbance or kidney injury as she had some electrolyte  troubles and renal dysfunction during her recent admission.  We will assess her skin with a chaperone to determine if she has an abscess or not.  Anticipate reassessment  after workup to determine disposition.  10:41 PM I was able to assess the patient's bottom with a chaperone and it does appeared that she had a cellulitis on her left gluteus area.  There is tenderness but I did not feel fluctuance crepitance or significant induration.  More erythema and warmth and tenderness.  She does have a leukocytosis of 16.6 and is having significant pain that we had to start with fentanyl , then morphine , and now giving her Dilaudid .  Due to the appearance I do suspect this is a cellulitis.  Will order IV antibiotics for her.  Will get the MRIs but anticipate she may need admission for IV antibiotics due to the cellulitis and pain.      Final diagnoses:  Cellulitis of buttock  Low back pain, unspecified back pain laterality, unspecified chronicity, unspecified whether sciatica present  Right leg weakness  Right leg numbness    Clinical Impression: 1. Cellulitis of buttock   2. Low back pain, unspecified back pain laterality, unspecified chronicity, unspecified whether sciatica present   3. Right leg weakness   4. Right leg numbness     Disposition: Care transferred oncoming team to wait for results of MRI of the brain and back to look for stroke or new metastasis changes.  Given the cellulitis and significant pain and leukocytosis, anticipate admission for cellulitis management and pain control and IV antibiotics.  IV antibiotics ordered initially.  This note was prepared with assistance of Conservation officer, historic buildings. Occasional wrong-word or sound-a-like substitutions may have occurred due to the inherent limitations of voice recognition software.     Burle Kwan, Lonni PARAS, MD 05/31/24 [phone]  "

## 2024-05-31 NOTE — ED Triage Notes (Addendum)
 Pt bib [facility] from [facility]. Per facility pt has been experiencing right leg weakness since this morning. Pt reports having leg weakness since she was dx with brain cancer in October and current weakness is no different. Alert to baseline, some confusion.

## 2024-06-01 ENCOUNTER — Encounter (HOSPITAL_COMMUNITY): Payer: Self-pay | Admitting: Family Medicine

## 2024-06-01 ENCOUNTER — Ambulatory Visit

## 2024-06-01 DIAGNOSIS — K219 Gastro-esophageal reflux disease without esophagitis: Secondary | ICD-10-CM

## 2024-06-01 DIAGNOSIS — E785 Hyperlipidemia, unspecified: Secondary | ICD-10-CM

## 2024-06-01 DIAGNOSIS — R29898 Other symptoms and signs involving the musculoskeletal system: Secondary | ICD-10-CM | POA: Diagnosis not present

## 2024-06-01 DIAGNOSIS — C3490 Malignant neoplasm of unspecified part of unspecified bronchus or lung: Secondary | ICD-10-CM | POA: Insufficient documentation

## 2024-06-01 DIAGNOSIS — L03317 Cellulitis of buttock: Secondary | ICD-10-CM

## 2024-06-01 DIAGNOSIS — F419 Anxiety disorder, unspecified: Secondary | ICD-10-CM | POA: Insufficient documentation

## 2024-06-01 DIAGNOSIS — I10 Essential (primary) hypertension: Secondary | ICD-10-CM

## 2024-06-01 DIAGNOSIS — E119 Type 2 diabetes mellitus without complications: Secondary | ICD-10-CM

## 2024-06-01 LAB — GLUCOSE, CAPILLARY
Glucose-Capillary: 110 mg/dL — ABNORMAL HIGH (ref 70–99)
Glucose-Capillary: 163 mg/dL — ABNORMAL HIGH (ref 70–99)
Glucose-Capillary: 120 mg/dL — ABNORMAL HIGH (ref 70–99)

## 2024-06-01 LAB — CBG MONITORING, ED
Glucose-Capillary: 201 mg/dL — ABNORMAL HIGH (ref 70–99)
Glucose-Capillary: 171 mg/dL — ABNORMAL HIGH (ref 70–99)

## 2024-06-01 MED ORDER — SODIUM CHLORIDE 0.9 % IV SOLN: INTRAVENOUS | Status: DC

## 2024-06-01 MED ORDER — MELATONIN 5 MG PO TABS: 5.0000 mg | ORAL_TABLET | Freq: Every evening | ORAL | Status: DC | PRN

## 2024-06-01 MED ORDER — ONDANSETRON HCL 4 MG/2ML IJ SOLN: 4.0000 mg | Freq: Four times a day (QID) | INTRAMUSCULAR | Status: DC | PRN

## 2024-06-01 MED ORDER — INSULIN ASPART 100 UNIT/ML IJ SOLN
6.0000 [IU] | Freq: Three times a day (TID) | INTRAMUSCULAR | Status: DC
  Administered 2024-06-01 – 2024-06-03 (×6): 6 [IU] via SUBCUTANEOUS
  Filled 2024-06-01 (×6): qty 6

## 2024-06-01 MED ORDER — VANCOMYCIN HCL 1250 MG/250ML IV SOLN
1250.0000 mg | INTRAVENOUS | Status: DC
  Administered 2024-06-02: 1250 mg via INTRAVENOUS
  Filled 2024-06-01: qty 250

## 2024-06-01 MED ORDER — MAGNESIUM HYDROXIDE 400 MG/5ML PO SUSP: 30.0000 mL | Freq: Every day | ORAL | Status: DC | PRN

## 2024-06-01 MED ORDER — DEXAMETHASONE 4 MG PO TABS
4.0000 mg | ORAL_TABLET | Freq: Two times a day (BID) | ORAL | Status: DC
  Administered 2024-06-01 – 2024-06-03 (×5): 4 mg via ORAL
  Filled 2024-06-01 (×6): qty 1

## 2024-06-01 MED ORDER — LEVETIRACETAM 750 MG PO TABS
750.0000 mg | ORAL_TABLET | Freq: Two times a day (BID) | ORAL | Status: DC
  Administered 2024-06-01 – 2024-06-03 (×5): 750 mg via ORAL
  Filled 2024-06-01 (×6): qty 1

## 2024-06-01 MED ORDER — ONDANSETRON HCL 4 MG PO TABS: 4.0000 mg | ORAL_TABLET | Freq: Four times a day (QID) | ORAL | Status: DC | PRN

## 2024-06-01 MED ORDER — INSULIN GLARGINE-YFGN 100 UNIT/ML ~~LOC~~ SOLN
20.0000 [IU] | Freq: Every day | SUBCUTANEOUS | Status: DC
  Administered 2024-06-01 – 2024-06-03 (×3): 20 [IU] via SUBCUTANEOUS
  Filled 2024-06-01 (×3): qty 0.2

## 2024-06-01 MED ORDER — SODIUM CHLORIDE 0.9 % IV SOLN
2.0000 g | Freq: Two times a day (BID) | INTRAVENOUS | Status: DC
  Administered 2024-06-01 – 2024-06-02 (×2): 2 g via INTRAVENOUS
  Filled 2024-06-01 (×2): qty 12.5

## 2024-06-01 MED ORDER — VANCOMYCIN HCL 2000 MG/400ML IV SOLN
2000.0000 mg | Freq: Once | INTRAVENOUS | Status: AC
  Administered 2024-06-01: 2000 mg via INTRAVENOUS
  Filled 2024-06-01: qty 400

## 2024-06-01 MED ORDER — INSULIN ASPART 100 UNIT/ML IJ SOLN: 0.0000 [IU] | Freq: Every day | INTRAMUSCULAR | Status: DC

## 2024-06-01 MED ORDER — SODIUM CHLORIDE 1 G PO TABS
1.0000 g | ORAL_TABLET | Freq: Two times a day (BID) | ORAL | Status: DC
  Administered 2024-06-01 – 2024-06-03 (×5): 1 g via ORAL
  Filled 2024-06-01 (×5): qty 1

## 2024-06-01 MED ORDER — SODIUM CHLORIDE 0.9 % IV SOLN
2.0000 g | Freq: Once | INTRAVENOUS | Status: AC
  Administered 2024-06-01: 2 g via INTRAVENOUS
  Filled 2024-06-01: qty 12.5

## 2024-06-01 MED ORDER — POLYETHYLENE GLYCOL 3350 17 G PO PACK
17.0000 g | PACK | Freq: Two times a day (BID) | ORAL | Status: DC
  Administered 2024-06-01: 17 g via ORAL
  Filled 2024-06-01 (×4): qty 1

## 2024-06-01 MED ORDER — ALPRAZOLAM 0.25 MG PO TABS
0.2500 mg | ORAL_TABLET | Freq: Three times a day (TID) | ORAL | Status: DC | PRN
  Administered 2024-06-03: 0.25 mg via ORAL
  Filled 2024-06-01: qty 1

## 2024-06-01 MED ORDER — ACETAMINOPHEN 325 MG PO TABS
650.0000 mg | ORAL_TABLET | Freq: Four times a day (QID) | ORAL | Status: DC | PRN
  Administered 2024-06-01: 650 mg via ORAL
  Filled 2024-06-01: qty 2

## 2024-06-01 MED ORDER — DESMOPRESSIN ACETATE 0.1 MG PO TABS
100.0000 ug | ORAL_TABLET | Freq: Every day | ORAL | Status: DC
  Administered 2024-06-01 – 2024-06-02 (×2): 100 ug via ORAL
  Filled 2024-06-01 (×3): qty 1

## 2024-06-01 MED ORDER — OXYCODONE HCL 5 MG PO TABS
5.0000 mg | ORAL_TABLET | Freq: Four times a day (QID) | ORAL | Status: DC | PRN
  Administered 2024-06-01 – 2024-06-03 (×6): 5 mg via ORAL
  Filled 2024-06-01 (×7): qty 1

## 2024-06-01 MED ORDER — LISINOPRIL 10 MG PO TABS
10.0000 mg | ORAL_TABLET | Freq: Every day | ORAL | Status: DC
  Administered 2024-06-01 – 2024-06-03 (×3): 10 mg via ORAL
  Filled 2024-06-01 (×3): qty 1

## 2024-06-01 MED ORDER — MORPHINE SULFATE (PF) 2 MG/ML IV SOLN
2.0000 mg | INTRAVENOUS | Status: DC | PRN
  Administered 2024-06-01 – 2024-06-03 (×4): 2 mg via INTRAVENOUS
  Filled 2024-06-01 (×4): qty 1

## 2024-06-01 MED ORDER — PANTOPRAZOLE SODIUM 40 MG PO TBEC
40.0000 mg | DELAYED_RELEASE_TABLET | Freq: Every day | ORAL | Status: DC
  Administered 2024-06-01 – 2024-06-03 (×3): 40 mg via ORAL
  Filled 2024-06-01 (×3): qty 1

## 2024-06-01 MED ORDER — ACETAMINOPHEN 650 MG RE SUPP: 650.0000 mg | Freq: Four times a day (QID) | RECTAL | Status: DC | PRN

## 2024-06-01 MED ORDER — INSULIN ASPART 100 UNIT/ML IJ SOLN
0.0000 [IU] | Freq: Three times a day (TID) | INTRAMUSCULAR | Status: DC
  Administered 2024-06-01: 5 [IU] via SUBCUTANEOUS
  Administered 2024-06-01 – 2024-06-02 (×4): 3 [IU] via SUBCUTANEOUS
  Administered 2024-06-02: 5 [IU] via SUBCUTANEOUS
  Administered 2024-06-03 (×2): 8 [IU] via SUBCUTANEOUS
  Filled 2024-06-01 (×2): qty 3
  Filled 2024-06-01: qty 8
  Filled 2024-06-01: qty 5
  Filled 2024-06-01: qty 3
  Filled 2024-06-01: qty 5
  Filled 2024-06-01: qty 3

## 2024-06-01 MED ORDER — ENOXAPARIN SODIUM 40 MG/0.4ML IJ SOSY
40.0000 mg | PREFILLED_SYRINGE | INTRAMUSCULAR | Status: DC
  Administered 2024-06-01 – 2024-06-02 (×2): 40 mg via SUBCUTANEOUS
  Filled 2024-06-01 (×2): qty 0.4

## 2024-06-01 NOTE — Hospital Course (Signed)
 Sonya Solomon is a 79 y.o. female with medical history significant for multiple sclerosis, essential hypertension, type 2 diabetes mellitus and metastatic lung cancer with brain metastasis, who presented to the emergency room with acute onset of right lower extremity weakness as well as low back pain and left gluteal pain with warmth and tenderness occasionally throbbing.  No drainage was noted.  No fever or chills.  No nausea or vomiting or abdominal pain.  No chest pain or palpitations.  No cough or wheezing or dyspnea.  No dysuria, oliguria or hematuria or flank pain.  No other paresthesias or focal muscle weakness.

## 2024-06-01 NOTE — H&P (Addendum)
 "     [facility]   PATIENT NAME: Sonya Solomon    MR#:  [id number]  DATE OF BIRTH:  1945-06-08  DATE OF ADMISSION:  05/31/2024  PRIMARY CARE PHYSICIAN: Leonel Cole, MD   Patient Sonya coming from: SNF.  REQUESTING/REFERRING PHYSICIAN: Wanita Bruckner, MD  CHIEF COMPLAINT:  Right leg weakness and left buttock pain.  HISTORY OF PRESENT ILLNESS:  Sonya Solomon Sonya a 79 y.o. Solomon with medical history significant for multiple sclerosis, essential hypertension, type 2 diabetes mellitus and metastatic lung cancer with brain metastasis, who presented to the emergency room with acute onset of right lower extremity weakness as well as low back pain and left gluteal pain with warmth and tenderness occasionally throbbing.  No drainage was noted.  No fever or chills.  No nausea or vomiting or abdominal pain.  No chest pain or palpitations.  No cough or wheezing or dyspnea.  No dysuria, oliguria or hematuria or flank pain.  No other paresthesias or focal muscle weakness.  ED Course: When the patient came to the ER, temperature was 97.5 and a BP 140/84 with otherwise normal vital signs.  Labs revealed mild hyponatremia at 134 and hyperglycemia of 270, BUN of 36 and calcium 8.2 alk phos 300 and albumin  2.4 with ALT of 55 and total protein 4.8.  CBC showed leukocytosis of 16.6 and neutrophilia with anemia.  TSH was 0.544 EKG as reviewed by me :EKG showed sinus rhythm with a rate of 64 with occasional PVCs and PACs and T wave inversion inferiorly and laterally.  Imaging: Brain MRI with and without contrast revealed the following: 1. Innumerable enhancing lesions throughout the supratentorial and infratentorial brain, consistent with intracranial metastatic disease. Overall number and distribution of these lesions Sonya similar to previous, although several of these lesions appear perhaps slightly smaller by a few mm in comparison with previous exam. 2. Dominant/largest lesion within the left cerebral  hemisphere at the posterior parafalcine left parietal lobe, measuring 2.2 x 2.9 x 2.1 cm. This lesion Sonya located along the distal left ACA territory, and could contribute to right lower extremity symptoms. 3. Associated vasogenic edema throughout both cerebral hemispheres, similar to perhaps slightly improved in appearance as compared to previous MRI. 4 mm left-to-right shift at the septum pellucidum. 4. No acute intracranial infarct or other abnormality.  Thoracic spine MRI with and without contrast revealed the following: 1. No evidence for metastatic disease within the thoracic spine. 2. 13 x 11 x 10 mm extramedullary, intradural enhancing nodular lesion within the left aspect of the thecal sac at the level of T11, not significantly changed from prior. 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 3. No significant disc pathology for age. No stenosis or neural impingement.  Lumbar spine MRI with and without contrast revealed the following: 1. No evidence for metastatic disease within the lumbar spine. 2. Small central disc protrusion at L5-S1, closely approximating the descending S1 nerve roots without overt neural impingement. 3. Mild to moderate bilateral facet hypertrophy at L4-5 with resultant mild bilateral lateral recess stenosis.  The patient was given 2 mg and 4 mg of IV morphine  sulfate, 1 mg IV Dilaudid , 50 mcg of IV fentanyl , 1 mg of IV Ativan  and 2 g of IV Ancef .  She will be admitted to a telemetry bed for further evaluation and management. PAST MEDICAL HISTORY:   Past Medical History:  Diagnosis Date   Diabetes mellitus without complication (HCC)    Hypertension    MS (multiple sclerosis)   -  Metastatic lung cancer with brain metastasis.  PAST SURGICAL HISTORY:   Past Surgical History:  Procedure Laterality Date   ABDOMINAL HYSTERECTOMY     APPENDECTOMY     BOWEL RESECTION  1996   BREAST REDUCTION SURGERY  07/10/1986   CATARACT EXTRACTION, BILATERAL      12/21/19, 09/19.21   CHOLECYSTECTOMY  05/2004   DE QUERVAIN'S RELEASE  05/18/2014   RADIAL STYLOIDECTOMY WRIST Left 05/10/2012   REDUCTION MAMMAPLASTY     TRIGGER FINGER RELEASE Left 01/09/2011   UMBILICAL HERNIA REPAIR  08/10/2006   VIDEO BRONCHOSCOPY WITH ENDOBRONCHIAL NAVIGATION Right 05/09/2024   Procedure: VIDEO BRONCHOSCOPY WITH ENDOBRONCHIAL NAVIGATION;  Surgeon: Shelah Lamar RAMAN, MD;  Location: [facility];  Service: Pulmonary;  Laterality: Right;    SOCIAL HISTORY:   Social History   Tobacco Use   Smoking status: Never   Smokeless tobacco: Never  Substance Use Topics   Alcohol use: Never    FAMILY HISTORY:   Family History  Problem Relation Age of Onset   Breast cancer Mother    Heart attack Father     DRUG ALLERGIES:  Allergies[1]  REVIEW OF SYSTEMS:   ROS As per history of present illness. All pertinent systems were reviewed above. Constitutional, HEENT, cardiovascular, respiratory, GI, GU, musculoskeletal, neuro, psychiatric, endocrine, integumentary and hematologic systems were reviewed and are otherwise negative/unremarkable except for positive findings mentioned above in the HPI.   MEDICATIONS AT HOME:   Prior to Admission medications  Medication Sig Start Date End Date Taking? Authorizing Provider  acetaminophen  (TYLENOL ) 500 MG tablet Take 2 tablets (1,000 mg total) by mouth 3 (three) times daily. 05/26/24   Will Almarie MATSU, MD  ALPRAZolam  (XANAX ) 0.25 MG tablet Take 1 tablet (0.25 mg total) by mouth 3 (three) times daily as needed for anxiety. 05/26/24   Will Almarie MATSU, MD  desmopressin  (DDAVP ) 0.1 MG tablet TAKE 1 TABLET BY MOUTH AT BEDTIME. 05/11/24   Sater, Charlie LABOR, MD  dexamethasone  (DECADRON ) 4 MG tablet Take 1 tablet (4 mg total) by mouth 2 (two) times daily. 05/26/24   Will Almarie MATSU, MD  insulin  aspart (NOVOLOG ) 100 UNIT/ML injection Inject 6 Units into the skin 3 (three) times daily with meals. 05/26/24   Will Almarie MATSU, MD   insulin  glargine-yfgn (SEMGLEE ) 100 UNIT/ML injection Inject 0.2 mLs (20 Units total) into the skin daily. 05/27/24   Will Almarie MATSU, MD  levETIRAcetam  (KEPPRA ) 750 MG tablet Take 1 tablet (750 mg total) by mouth 2 (two) times daily. 03/02/24   Sater, Charlie LABOR, MD  lisinopril  (ZESTRIL ) 10 MG tablet TAKE 1 TABLET(10 MG) BY MOUTH DAILY 07/13/22   Duanne Butler DASEN, MD  melatonin 5 MG TABS Take 1 tablet (5 mg total) by mouth at bedtime as needed. 05/26/24   Will Almarie MATSU, MD  omeprazole (PRILOSEC) 20 MG capsule Take 20 mg by mouth daily. 07/07/21   [provider]  oxyCODONE  (OXY IR/ROXICODONE ) 5 MG immediate release tablet Take 1 tablet (5 mg total) by mouth every 6 (six) hours as needed for moderate pain (pain score 4-6). 05/26/24   Will Almarie MATSU, MD  polyethylene glycol (MIRALAX  / GLYCOLAX ) 17 g packet Take 17 g by mouth 2 (two) times daily. 05/26/24   Will Almarie MATSU, MD  simvastatin  (ZOCOR ) 40 MG tablet TAKE 1 TABLET(40 MG) BY MOUTH DAILY 08/11/21   Duanne Butler DASEN, MD  sodium chloride  1 g tablet Take 1 tablet (1 g total) by mouth 2 (two) times daily with a  meal. 05/26/24   Will Almarie MATSU, MD      VITAL SIGNS:  Blood pressure 131/75, pulse (!) 57, temperature 98 F (36.7 C), temperature source Oral, resp. rate 15, height 5' 4.5 (1.638 m), weight 89.6 kg, SpO2 97%.  PHYSICAL EXAMINATION:  Physical Exam  GENERAL:  79 y.o.-year-old Caucasian Solomon patient lying in the bed with no acute distress.  EYES: Pupils equal, round, reactive to light and accommodation. No scleral icterus. Extraocular muscles intact.  HEENT: Head atraumatic, normocephalic. Oropharynx and nasopharynx clear.  NECK:  Supple, no jugular venous distention. No thyroid  enlargement, no tenderness.  LUNGS: Normal breath sounds bilaterally, no wheezing, rales,rhonchi or crepitation. No use of accessory muscles of respiration.  CARDIOVASCULAR: Regular rate and rhythm, S1, S2 normal. No murmurs,  rubs, or gallops.  ABDOMEN: Soft, nondistended, nontender. Bowel sounds present. No organomegaly or mass.  EXTREMITIES: No pedal edema, cyanosis, or clubbing.  NEUROLOGIC: Cranial nerves II through XII are intact. Muscle strength 5/5 in all extremities. Sensation intact. Gait not checked.  PSYCHIATRIC: The patient Sonya alert and oriented x 3.  Normal affect and good eye contact. SKIN: Left gluteal and to lesser extent to right gluteal and right upper thigh erythema with tenderness mainly in the left gluteal area near the sacrum without fluctuation.  LABORATORY PANEL:   CBC Recent Labs  Lab 05/31/24 1836  WBC 16.6*  HGB 10.2*  HCT 31.4*  PLT 219   ------------------------------------------------------------------------------------------------------------------  Chemistries  Recent Labs  Lab 05/31/24 1836  NA 134*  K 4.5  CL 100  CO2 24  GLUCOSE 270*  BUN 36*  CREATININE 0.91  CALCIUM 8.2*  MG 1.8  AST 24  ALT 55*  ALKPHOS 300*  BILITOT 0.3   ------------------------------------------------------------------------------------------------------------------  Cardiac Enzymes No results for input(s): TROPONINI in the last 168 hours. ------------------------------------------------------------------------------------------------------------------  RADIOLOGY:  MR Lumbar Spine W Wo Contrast Result Date: 06/01/2024 CLINICAL DATA:  Initial evaluation for possible metastatic disease. New right lower extremity numbness and weakness. EXAM: MRI LUMBAR SPINE WITHOUT AND WITH CONTRAST TECHNIQUE: Multiplanar and multiecho pulse sequences of the lumbar spine were obtained without and with intravenous contrast. CONTRAST:  9mL GADAVIST  GADOBUTROL  1 MMOL/ML IV SOLN COMPARISON:  Prior MRI from 05/13/2024. FINDINGS: Segmentation: Standard. Lowest well-formed disc space labeled the L5-S1 level. Alignment: Physiologic with preservation of the normal lumbar lordosis. Trace facet mediated  anterolisthesis of L4 on L5. Vertebrae: Vertebral body height maintained without acute or chronic fracture. Bone marrow signal intensity within normal limits. No discrete or worrisome osseous lesions or evidence for metastatic disease. No abnormal marrow edema or enhancement. Conus medullaris and cauda equina: Conus extends to the L1 level. Conus and cauda equina appear normal. Enhancing nodular lesion posterior to the T11 vertebral body, again favored to reflect a meningioma, described on corresponding MRI of the thoracic spine. Paraspinal and other soft tissues: Mild diffuse edema throughout the posterior paraspinous soft tissues, favored to be related to overall volume status. No collections. No other acute abnormality. Disc levels: L1-2:  Unremarkable. L2-3: Normal interspace. Mild bilateral facet hypertrophy. No stenosis. L3-4: Negative interspace. Mild bilateral facet hypertrophy. No stenosis. L4-5: Trace anterolisthesis. Disc desiccation without significant disc bulge. Small extraforaminal annular fissures noted. Mild to moderate facet hypertrophy with small joint effusions. Resultant mild narrowing of the lateral recesses bilaterally. Central canal remains patent. No significant foraminal stenosis. L5-S1: Degenerative disc space narrowing with disc desiccation. Small central disc protrusion indents the ventral thecal sac, closely approximating the descending S1 nerve roots without overt  neural impingement (series 9, image 31). No significant canal or lateral recess stenosis. Foramina remain patent. IMPRESSION: 1. No evidence for metastatic disease within the lumbar spine. 2. Small central disc protrusion at L5-S1, closely approximating the descending S1 nerve roots without overt neural impingement. 3. Mild to moderate bilateral facet hypertrophy at L4-5 with resultant mild bilateral lateral recess stenosis. Electronically Signed   By: Morene Hoard M.D.   On: 06/01/2024 02:02   MR THORACIC SPINE W  WO CONTRAST Result Date: 06/01/2024 CLINICAL DATA:  Initial evaluation for possible metastatic disease. New right lower extremity numbness and weakness. EXAM: MRI THORACIC WITHOUT AND WITH CONTRAST TECHNIQUE: Multiplanar and multiecho pulse sequences of the thoracic spine were obtained without and with intravenous contrast. CONTRAST:  9mL GADAVIST  GADOBUTROL  1 MMOL/ML IV SOLN COMPARISON:  Prior MRI from 05/13/2024. FINDINGS: Alignment: Vertebral bodies normally aligned with preservation of the normal thoracic kyphosis. No listhesis. Vertebrae: Vertebral body height maintained without acute or chronic fracture. Bone marrow signal intensity mildly heterogeneous but overall within normal limits. No worrisome osseous lesions or evidence for metastatic disease. No abnormal marrow edema or enhancement. Cord: Normal signal and morphology. Extramedullary, intradural enhancing nodular lesion within the left aspect of the thecal sac at the level of T11 again seen, measuring 13 x 11 x 10 mm, not significantly changed from prior. 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 No evidence for intracanalicular metastatic disease. Paraspinal and other soft tissues: 3.2 cm right thyroid  nodule, previously evaluated on recent thyroid  ultrasound from 05/07/2024. Scattered atelectatic changes noted dependently within the visualized lungs. Trace layering bilateral pleural effusions. Disc levels: No significant disc pathology for age. No significant disc bulge or focal disc herniation. No more than mild intermittent facet degeneration. No significant spinal stenosis. Neural foramina remain patent. No neural impingement. IMPRESSION: 1. No evidence for metastatic disease within the thoracic spine. 2. 13 x 11 x 10 mm extramedullary, intradural enhancing nodular lesion within the left aspect of the thecal sac at the level of T11, not significantly changed from prior. 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 3. No significant disc pathology for  age. No stenosis or neural impingement. Electronically Signed   By: Morene Hoard M.D.   On: 06/01/2024 01:56   MR Brain W and Wo Contrast Result Date: 06/01/2024 CLINICAL DATA:  Initial evaluation for acute neuro deficit, stroke suspected, right lower extremity numbness and weakness. EXAM: MRI HEAD WITHOUT AND WITH CONTRAST TECHNIQUE: Multiplanar, multiecho pulse sequences of the brain and surrounding structures were obtained without and with intravenous contrast. CONTRAST:  9mL GADAVIST  GADOBUTROL  1 MMOL/ML IV SOLN COMPARISON:  Prior MRI from 05/13/2024. FINDINGS: Brain: Again seen are innumerable enhancing lesions throughout the supratentorial infratentorial brain, consistent with intracranial metastatic disease. This has been estimated at approximately 45 lesions on previous MRI. Overall number and distribution of these lesions Sonya similar to previous, although several of these lesions appear perhaps slightly smaller by a few mm in comparison with previous exam. Specific note Sonya made of the dominant/largest lesion within the left cerebral hemisphere at the posterior parafalcine left parietal lobe, measuring 2.2 x 2.9 x 2.1 cm (series 16, image 142). This lesion Sonya located along the distal left ACA territory, and could contribute to right lower extremity symptoms. Associated susceptibility artifact noted about a few lesions, consistent with blood products and/or necrosis. Associated vasogenic edema seen throughout both cerebral hemispheres, similar to perhaps slightly improved in appearance as compared to prior MRI. 4 mm left-to-right shift at the septum pellucidum. No  evidence for acute or subacute infarct. No areas of chronic cortical infarction. No other acute intracranial hemorrhage. No hydrocephalus or extra-axial fluid collection. Pituitary gland and suprasellar region within normal limits. Vascular: Major intracranial vascular flow voids are maintained. Skull and upper cervical spine:  Craniocervical junction within normal limits. Bone marrow signal intensity normal. No scalp soft tissue abnormality. Sinuses/Orbits: Prior bilateral ocular lens replacement. Paranasal sinuses are largely clear. No significant mastoid effusion. Other: None. IMPRESSION: 1. Innumerable enhancing lesions throughout the supratentorial and infratentorial brain, consistent with intracranial metastatic disease. Overall number and distribution of these lesions Sonya similar to previous, although several of these lesions appear perhaps slightly smaller by a few mm in comparison with previous exam. 2. Dominant/largest lesion within the left cerebral hemisphere at the posterior parafalcine left parietal lobe, measuring 2.2 x 2.9 x 2.1 cm. This lesion Sonya located along the distal left ACA territory, and could contribute to right lower extremity symptoms. 3. Associated vasogenic edema throughout both cerebral hemispheres, similar to perhaps slightly improved in appearance as compared to previous MRI. 4 mm left-to-right shift at the septum pellucidum. 4. No acute intracranial infarct or other abnormality. Electronically Signed   By: Morene Hoard M.D.   On: 06/01/2024 01:06      IMPRESSION AND PLAN:  Assessment and Plan: * Cellulitis of gluteal region - The patient will be admitted to a medical telemetry bed. - Will continue antibiotic therapy with IV vancomycin  and cefepime . - Pain management will be provided. - Warm compresses will be utilized. - I will defer general surgery consult to the morning hospitalist depending on clinical progress with IV antibiotics.  Right leg weakness - Physical therapy consult to be obtained. - Dr. Vanessa  was not notified about the patient and indicated that there Sonya no further recommendations more than continuing Decadron .  He did not perceive significant new changes in the brain MRIs that would attribute to the weakness.  Clinically the weakness was actually better during  my interview.  Type 2 diabetes mellitus without complications (HCC) - The patient will be placed on supplemental coverage With NovoLog .  Anxiety - Will continue Xanax .  GERD without esophagitis - Will continue PPI therapy.  Essential hypertension - Will continue antihypertensive therapy.  Dyslipidemia - Will continue statin therapy.  Metastatic lung cancer (metastasis from lung to other site) [facility]) - The patient has brain metastasis. - Will continue Decadron  as well as Keppra . - Case management consult will be obtained as the patient expressed mild concern about her SNF.   DVT prophylaxis: Lovenox . Advanced Care Planning:  Code Status: Patient Sonya DNR and DNI.  This was discussed with her and her husband. Family Communication:  The plan of care was discussed in details with the patient (and family). I answered all questions. The patient agreed to proceed with the above mentioned plan. Further management will depend upon hospital course. Disposition Plan: Back to previous home environment Consults called: None yet. All the records are reviewed and case discussed with ED provider.  Status Sonya: Inpatient  At the time of the admission, it appears that the appropriate admission status for this patient Sonya inpatient.  This Sonya judged to be reasonable and necessary in order to provide the required intensity of service to ensure the patient's safety given the presenting symptoms, physical exam findings and initial radiographic and laboratory data in the context of comorbid conditions.  The patient requires inpatient status due to high intensity of service, high risk of further deterioration and high  frequency of surveillance required.  I certify that at the time of admission, it Sonya my clinical judgment that the patient will require inpatient hospital care extending more than 2 midnights.                            Dispo: The patient Sonya from: Home              Anticipated d/c Sonya to: Home               Patient currently Sonya not medically stable to d/c.              Difficult to place patient: No  Madison DELENA Peaches M.D on 06/01/2024 at 5:51 AM  [facility]   From 7 PM-7 AM, contact night-coverage [email or web address]  CC: Primary care physician; Leonel Cole, MD     [1]  Allergies Allergen Reactions   Latex Rash   "

## 2024-06-01 NOTE — Assessment & Plan Note (Addendum)
-   MRI brain shows innumerable enhancing lesions throughout the supratentorial and infratentorial brain consistent with intracranial metastatic disease; most lesions appeared smaller however dominant lesion in the left parietal lobe measuring 2.2 x 2.9 x 2.1 cm -Continue Keppra  and Decadron

## 2024-06-01 NOTE — Progress Notes (Signed)
 Pharmacy Antibiotic Note  Sonya Solomon is a 79 y.o. female admitted on 05/31/2024 with right leg weakness/cellulitis of groin.  Pharmacy has been consulted for cefepime /vancomycin  dosing.  Plan: -Cefepime  2g IV every 12 hours -Vancomycin  2g IV x1 -Vancomycin  1250mg  IV every 24 hours (AUC 460, IBW, Vd 0.72, sCr 0.91) -Monitor renal function -Follow up signs of clinical improvement, LOT, de-escalation of antibiotics   Height: 5' 4.5 (163.8 cm) Weight: 89.6 kg (197 lb 8 oz) IBW/kg (Calculated) : 55.85  Temp (24hrs), Avg:97.8 F (36.6 C), Min:97.5 F (36.4 C), Max:98 F (36.7 C)  Recent Labs  Lab 05/31/24 1836  WBC 16.6*  CREATININE 0.91    Estimated Creatinine Clearance: 57.6 mL/min (by C-G formula based on SCr of 0.91 mg/dL).    Allergies[1]  Antimicrobials this admission: Cefepime  1/29 >>  Vancomycin  1/29 >>   Microbiology results:   Thank you for allowing pharmacy to be a part of this patients care.  Lynwood Poplar, PharmD, BCPS Clinical Pharmacist 06/01/2024 6:02 AM     [1]  Allergies Allergen Reactions   Latex Rash

## 2024-06-01 NOTE — Assessment & Plan Note (Addendum)
-   Physical therapy consult to be obtained. - Dr. Vanessa  was not notified about the patient and indicated that there is no further recommendations more than continuing Decadron .  He did not perceive significant new changes in the brain MRIs that would attribute to the weakness. - Technically weak in bilateral lower extremities but not overtly different in RLE on exam, but effort seems poor

## 2024-06-01 NOTE — Assessment & Plan Note (Signed)
-   Will continue antihypertensive therapy.

## 2024-06-01 NOTE — Assessment & Plan Note (Addendum)
-   no further mortality benefit to statin use in setting with age and advanced cancer; discontinue statin

## 2024-06-01 NOTE — ED Notes (Signed)
 Up to floor via stretcher, IVF infusing, no changes, alert, NAD, calm, interactive.

## 2024-06-01 NOTE — ED Notes (Signed)
 CCMD called and pt put on the monitor

## 2024-06-01 NOTE — Consult Note (Addendum)
" ° ° °  CLINICAL SUPPORT TEAM - WOUND OSTOMY AND CONTINENCE TEAM  CONSULTATION SERVICES   WOC Nurse-Inpatient Note  WOC Nurse Consult Note: Reason for Consult: Consult requested for left buttock.  Performed remotely after review of progress notes and photos in the EMR.  Left buttock with generalized erythremia and patchy areas of red moist partial thickness skin loss.  Pt is currently being treated for cellulitis with systemic antibiotics; appearance is also consistent with chronic moisture associated skin damage and shear from sliding types of movement. This is NOT a pressure injury.   Topical treatment orders provided for bedside nurses to perform as follows to promote moist healing: Apply Xeroform gauze to left buttock and cover with foam dressing. Change dressing 3 times a week (start on 1/29)  Please re-consult if further assistance is needed.  Thank-you,  Stephane Fought MSN, RN, CWOCN, CWCN-AP, CNS Contact Mon-Fri 0700-1500: [phone]    "

## 2024-06-01 NOTE — Assessment & Plan Note (Signed)
-   Will continue PPI therapy.

## 2024-06-01 NOTE — Assessment & Plan Note (Signed)
-   Will continue Xanax .

## 2024-06-01 NOTE — ED Provider Notes (Signed)
 Patient signed out to me by Dr. Cleotis.  Patient with right leg weakness in the setting of MS and metastatic lung cancer.  Patient has known brain mets.  Patient underwent MRI brain, thoracic and lumbar spine.  Patient does have mets in the brain, similar or improved from prior, no new lesions.  The 1 dominant lesion is in the ACA territory, perhaps could be causing some of the right leg weakness.  Patient currently on Decadron  4 mg twice daily.  Discussed briefly with Dr. Vanessa.  As lesions are stable or smaller than prior, no new recommendations for treatment.  Patient has a single lesion in the thoracic spine that is felt to be meningioma, not metastatic disease and unchanged from prior.  No lesions in the lumbar spine.  Patient with erythema consistent with cellulitis in the low back/buttock area, no concerning features for abscess per Dr. Cleotis.  Administered antibiotics earlier in the stay, will asked hospitalist to admit patient for further treatment of cellulitis.   Sonya Lonni PARAS, MD 06/01/24 [phone]

## 2024-06-01 NOTE — Progress Notes (Signed)
 " Progress Note    Sonya Solomon   FMW:[id number]  DOB: 31-Aug-1945  DOA: 05/31/2024     0 PCP: Leonel Cole, MD  Initial CC: left gluteal wound  Hospital Course: Sonya Solomon is a 79 y.o. female with medical history significant for multiple sclerosis, essential hypertension, type 2 diabetes mellitus and metastatic lung cancer with brain metastasis, who presented to the emergency room with acute onset of right lower extremity weakness as well as low back pain and left gluteal pain with warmth and tenderness occasionally throbbing.  No drainage was noted.  No fever or chills.  No nausea or vomiting or abdominal pain.  No chest pain or palpitations.  No cough or wheezing or dyspnea.  No dysuria, oliguria or hematuria or flank pain.  No other paresthesias or focal muscle weakness.   Interval History:  Resting in bed when seen in ER this morning.  Poor historian and slightly confused likely in setting of her underlying metastatic brain disease. Somewhat poor effort on exam with strength testing.  Assessment and Plan: * Cellulitis of gluteal region - With erythema, tenderness, and wound on left gluteal area - Reasonable to treat as cellulitis - Do not think needs general surgery consult at this time.  Will have wound care evaluate also - Continue antibiotics  Right leg weakness - Physical therapy consult to be obtained. - Dr. Vanessa  was not notified about the patient and indicated that there is no further recommendations more than continuing Decadron .  He did not perceive significant new changes in the brain MRIs that would attribute to the weakness. - Technically weak in bilateral lower extremities but not overtly different in RLE on exam, but effort seems poor  Type 2 diabetes mellitus without complications (HCC) - Continue SSI and CBG monitoring  Anxiety - Will continue Xanax .  GERD without esophagitis - Will continue PPI therapy.  Essential hypertension - Will continue  antihypertensive therapy.  Dyslipidemia - Will continue statin therapy.  Metastatic lung cancer (metastasis from lung to other site) [facility]) - MRI brain shows innumerable enhancing lesions throughout the supratentorial and infratentorial brain consistent with intracranial metastatic disease; most lesions appeared smaller however dominant lesion in the left parietal lobe measuring 2.2 x 2.9 x 2.1 cm -Continue Keppra  and Decadron    Antimicrobials: Ancef  06/01/2024 x 1 Cefepime  06/01/2024 >> current Vancomycin  06/01/2024 >> current  Consultants:    Procedures:    DVT prophylaxis:  enoxaparin  (LOVENOX ) injection 40 mg Start: 06/01/24 1400   Code Status:   Code Status: Limited: Do not attempt resuscitation (DNR) -DNR-LIMITED -Do Not Intubate/DNI   Barriers to discharge:  Therapy evaluation: PT Orders:   PT Follow up Rec:   Disposition Plan: Pending PT eval Status is: Inpatient  Mobility Assessment (Last 72 Hours)     Mobility Assessment   No documentation.     Diet: Diet Orders (From admission, onward)     Start     Ordered   06/02/24 0001  Diet NPO time specified  Diet effective midnight        06/01/24 0511   06/01/24 0451  Diet NPO time specified Except for: Sips with Meds  Diet effective now       Question:  Except for  Answer:  Sips with Meds   06/01/24 0451            Objective: Blood pressure 109/62, pulse 72, temperature 98.4 F (36.9 C), temperature source Oral, resp. rate 14, height 5' 4.5 (1.638 m),  weight 89.6 kg, SpO2 98%.  Examination:  Physical Exam Constitutional:      Comments: Slightly confused and poor historian.  No distress  HENT:     Head: Normocephalic and atraumatic.     Mouth/Throat:     Mouth: Mucous membranes are moist.  Eyes:     Extraocular Movements: Extraocular movements intact.  Cardiovascular:     Rate and Rhythm: Normal rate and regular rhythm.  Pulmonary:     Effort: Pulmonary effort is normal. No respiratory  distress.     Breath sounds: Normal breath sounds. No wheezing.  Abdominal:     General: Bowel sounds are normal. There is no distension.     Palpations: Abdomen is soft.     Tenderness: There is no abdominal tenderness.  Musculoskeletal:        General: Normal range of motion.     Cervical back: Normal range of motion and neck supple.  Skin:    General: Skin is warm and dry.  Neurological:     Comments: Poor effort with strength testing and sometimes difficulty understanding commands.  General weakness noted throughout but worse in bilateral lower extremities  Psychiatric:        Mood and Affect: Mood normal.      Data Reviewed: Results for orders placed or performed during the hospital encounter of 05/31/24 (from the past 24 hours)  CBC with Differential     Status: Abnormal   Collection Time: 05/31/24  6:36 PM  Result Value Ref Range   WBC 16.6 (H) 4.0 - 10.5 K/uL   RBC 3.24 (L) 3.87 - 5.11 MIL/uL   Hemoglobin 10.2 (L) 12.0 - 15.0 g/dL   HCT 68.5 (L) 63.9 - 53.9 %   MCV 96.9 80.0 - 100.0 fL   MCH 31.5 26.0 - 34.0 pg   MCHC 32.5 30.0 - 36.0 g/dL   RDW 84.0 (H) 88.4 - 84.4 %   Platelets 219 150 - 400 K/uL   nRBC 0.0 0.0 - 0.2 %   Neutrophils Relative % 93 %   Neutro Abs 15.5 (H) 1.7 - 7.7 K/uL   Lymphocytes Relative 2 %   Lymphs Abs 0.3 (L) 0.7 - 4.0 K/uL   Monocytes Relative 4 %   Monocytes Absolute 0.7 0.1 - 1.0 K/uL   Eosinophils Relative 0 %   Eosinophils Absolute 0.0 0.0 - 0.5 K/uL   Basophils Relative 0 %   Basophils Absolute 0.0 0.0 - 0.1 K/uL   Immature Granulocytes 1 %   Abs Immature Granulocytes 0.15 (H) 0.00 - 0.07 K/uL  Comprehensive metabolic panel     Status: Abnormal   Collection Time: 05/31/24  6:36 PM  Result Value Ref Range   Sodium 134 (L) 135 - 145 mmol/L   Potassium 4.5 3.5 - 5.1 mmol/L   Chloride 100 98 - 111 mmol/L   CO2 24 22 - 32 mmol/L   Glucose, Bld 270 (H) 70 - 99 mg/dL   BUN 36 (H) 8 - 23 mg/dL   Creatinine, Ser 9.08 0.44 - 1.00  mg/dL   Calcium 8.2 (L) 8.9 - 10.3 mg/dL   Total Protein 4.8 (L) 6.5 - 8.1 g/dL   Albumin  2.4 (L) 3.5 - 5.0 g/dL   AST 24 15 - 41 U/L   ALT 55 (H) 0 - 44 U/L   Alkaline Phosphatase 300 (H) 38 - 126 U/L   Total Bilirubin 0.3 0.0 - 1.2 mg/dL   GFR, Estimated >39 >39 mL/min   Anion gap  11 5 - 15  TSH     Status: None   Collection Time: 05/31/24  6:36 PM  Result Value Ref Range   TSH 0.544 0.350 - 4.500 uIU/mL  Magnesium      Status: None   Collection Time: 05/31/24  6:36 PM  Result Value Ref Range   Magnesium  1.8 1.7 - 2.4 mg/dL  CBG monitoring, ED     Status: Abnormal   Collection Time: 06/01/24  5:37 AM  Result Value Ref Range   Glucose-Capillary 201 (H) 70 - 99 mg/dL  CBG monitoring, ED     Status: Abnormal   Collection Time: 06/01/24  8:17 AM  Result Value Ref Range   Glucose-Capillary 171 (H) 70 - 99 mg/dL    I have reviewed pertinent nursing notes, vitals, labs, and images as necessary. I have ordered labwork to follow up on as indicated.  I have reviewed the last notes from staff over past 24 hours. I have discussed patient's care plan and test results with nursing staff, CM/SW, and other staff as appropriate.  Old records reviewed in assessment of this patient  Time spent: Greater than 50% of the 55 minute visit was spent in counseling/coordination of care for the patient as laid out in the A&P.   LOS: 0 days   Alm Apo, MD [facility] 06/01/2024, 11:20 AM "

## 2024-06-01 NOTE — ED Notes (Addendum)
 Resting/ sleeping, arousable to voice, alert, NAD, calm, interactive, resps e/u, speaking in clear complete sentences. VSS. Denies pain, sob, nausea, itching, dizziness. States, feel pretty good. IVF and vanc infusing.

## 2024-06-01 NOTE — Assessment & Plan Note (Addendum)
-   With erythema, tenderness, and wound on left gluteal area - Reasonable to treat as cellulitis - Continue wound care: Apply Xeroform gauze to left buttock and cover with foam dressing. Change dressing 3 times a week (start on 1/29)  - Has been on vancomycin  and cefepime .  Will de-escalate to oral regimen today with Bactrim ; leukocytosis attributed to Decadron  use

## 2024-06-01 NOTE — Assessment & Plan Note (Addendum)
-   continue home regimen  

## 2024-06-02 DIAGNOSIS — C3490 Malignant neoplasm of unspecified part of unspecified bronchus or lung: Secondary | ICD-10-CM | POA: Diagnosis not present

## 2024-06-02 DIAGNOSIS — L03317 Cellulitis of buttock: Secondary | ICD-10-CM | POA: Diagnosis not present

## 2024-06-02 LAB — CBC WITH DIFFERENTIAL/PLATELET
Abs Immature Granulocytes: 0.13 10*3/uL — ABNORMAL HIGH (ref 0.00–0.07)
Basophils Absolute: 0 10*3/uL (ref 0.0–0.1)
Basophils Relative: 0 %
Eosinophils Absolute: 0 10*3/uL (ref 0.0–0.5)
Eosinophils Relative: 0 %
HCT: 32.6 % — ABNORMAL LOW (ref 36.0–46.0)
Hemoglobin: 10.9 g/dL — ABNORMAL LOW (ref 12.0–15.0)
Immature Granulocytes: 1 %
Lymphocytes Relative: 3 %
Lymphs Abs: 0.4 10*3/uL — ABNORMAL LOW (ref 0.7–4.0)
MCH: 32.2 pg (ref 26.0–34.0)
MCHC: 33.4 g/dL (ref 30.0–36.0)
MCV: 96.4 fL (ref 80.0–100.0)
Monocytes Absolute: 0.5 10*3/uL (ref 0.1–1.0)
Monocytes Relative: 3 %
Neutro Abs: 16.5 10*3/uL — ABNORMAL HIGH (ref 1.7–7.7)
Neutrophils Relative %: 93 %
Platelets: 201 10*3/uL (ref 150–400)
RBC: 3.38 MIL/uL — ABNORMAL LOW (ref 3.87–5.11)
RDW: 15.8 % — ABNORMAL HIGH (ref 11.5–15.5)
Smear Review: NORMAL
WBC: 17.7 10*3/uL — ABNORMAL HIGH (ref 4.0–10.5)
nRBC: 0.1 % (ref 0.0–0.2)

## 2024-06-02 LAB — MAGNESIUM: Magnesium: 1.6 mg/dL — ABNORMAL LOW (ref 1.7–2.4)

## 2024-06-02 LAB — BASIC METABOLIC PANEL WITH GFR
Anion gap: 9 (ref 5–15)
BUN: 31 mg/dL — ABNORMAL HIGH (ref 8–23)
CO2: 22 mmol/L (ref 22–32)
Calcium: 8.1 mg/dL — ABNORMAL LOW (ref 8.9–10.3)
Chloride: 104 mmol/L (ref 98–111)
Creatinine, Ser: 0.71 mg/dL (ref 0.44–1.00)
GFR, Estimated: >60 mL/min
Glucose, Bld: 152 mg/dL — ABNORMAL HIGH (ref 70–99)
Potassium: 4.5 mmol/L (ref 3.5–5.1)
Sodium: 135 mmol/L (ref 135–145)

## 2024-06-02 LAB — GLUCOSE, CAPILLARY
Glucose-Capillary: 154 mg/dL — ABNORMAL HIGH (ref 70–99)
Glucose-Capillary: 205 mg/dL — ABNORMAL HIGH (ref 70–99)
Glucose-Capillary: 156 mg/dL — ABNORMAL HIGH (ref 70–99)
Glucose-Capillary: 179 mg/dL — ABNORMAL HIGH (ref 70–99)

## 2024-06-02 MED ORDER — SODIUM CHLORIDE 0.9 % IV SOLN
2.0000 g | Freq: Three times a day (TID) | INTRAVENOUS | Status: DC
  Administered 2024-06-02: 2 g via INTRAVENOUS
  Filled 2024-06-02: qty 12.5

## 2024-06-02 MED ORDER — SULFAMETHOXAZOLE-TRIMETHOPRIM 800-160 MG PO TABS
1.0000 | ORAL_TABLET | Freq: Two times a day (BID) | ORAL | Status: DC
  Administered 2024-06-02 – 2024-06-03 (×3): 1 via ORAL
  Filled 2024-06-02 (×4): qty 1

## 2024-06-02 NOTE — Progress Notes (Signed)
 " Progress Note    Sonya Solomon   FMW:[id number]  DOB: 09/28/1945  DOA: 05/31/2024     1 PCP: Leonel Cole, MD  Initial CC: left gluteal wound  Hospital Course: Sonya Solomon is a 79 y.o. female with medical history significant for multiple sclerosis, essential hypertension, type 2 diabetes mellitus and metastatic lung cancer with brain metastasis, who presented to the emergency room with acute onset of right lower extremity weakness as well as low back pain and left gluteal pain with warmth and tenderness occasionally throbbing.  No drainage was noted.  No fever or chills.  No nausea or vomiting or abdominal pain.  No chest pain or palpitations.  No cough or wheezing or dyspnea.  No dysuria, oliguria or hematuria or flank pain.  No other paresthesias or focal muscle weakness.   Interval History:  No events overnight.  Resting comfortably.  Some improvement in pain on her backside. Wound care ongoing. Disposition planning underway.  Theoretically could discharge back as early as tomorrow if everything works out regarding facility return.  Assessment and Plan: * Cellulitis of gluteal region - With erythema, tenderness, and wound on left gluteal area - Reasonable to treat as cellulitis - Continue wound care: Apply Xeroform gauze to left buttock and cover with foam dressing. Change dressing 3 times a week (start on 1/29)  - Has been on vancomycin  and cefepime .  Will de-escalate to oral regimen today with Bactrim ; leukocytosis attributed to Decadron  use  Right leg weakness - Physical therapy consult to be obtained. - Dr. Vanessa  was not notified about the patient and indicated that there is no further recommendations more than continuing Decadron .  He did not perceive significant new changes in the brain MRIs that would attribute to the weakness. - Technically weak in bilateral lower extremities but not overtly different in RLE on exam, but effort seems poor  Metastatic lung cancer  (metastasis from lung to other site) (HCC) - MRI brain shows innumerable enhancing lesions throughout the supratentorial and infratentorial brain consistent with intracranial metastatic disease; most lesions appeared smaller however dominant lesion in the left parietal lobe measuring 2.2 x 2.9 x 2.1 cm -Continue Keppra  and Decadron   Type 2 diabetes mellitus without complications (HCC) - Continue SSI and CBG monitoring  Anxiety - Will continue Xanax .  GERD without esophagitis - Will continue PPI therapy.  Essential hypertension - Will continue antihypertensive therapy.  Dyslipidemia - Will continue statin therapy.   Antimicrobials: Ancef  06/01/2024 x 1 Cefepime  06/01/2024 >> 1/30 Vancomycin  06/01/2024 >> 1/30 Bactrim  06/02/24 >> current   Consultants:    Procedures:    DVT prophylaxis:  enoxaparin  (LOVENOX ) injection 40 mg Start: 06/01/24 1400   Code Status:   Code Status: Limited: Do not attempt resuscitation (DNR) -DNR-LIMITED -Do Not Intubate/DNI   Barriers to discharge:  Therapy evaluation: PT Orders:   PT Follow up Rec:   Disposition Plan: Pending PT eval Status is: Inpatient  Mobility Assessment (Last 72 Hours)     Mobility Assessment     Row Name 06/02/24 0913 06/01/24 2125 06/01/24 1219       Does the patient have exclusion criteria? No- Perform mobility assessment No- Perform mobility assessment Yes- Bedfast (Level 1) - Select exclusion criteria in next row     Mobility Assessment Exclusion Criteria No exclusion criteria present, perform mobility assessment -- No exclusion criteria present, perform mobility assessment     What is the highest level of mobility based on the mobility assessment? Level  1 (Bedfast) - Unable to balance while sitting on edge of bed Level 1 (Bedfast) - Unable to balance while sitting on edge of bed Level 1 (Bedfast) - Unable to balance while sitting on edge of bed     Is the above level different from baseline mobility prior to  current illness? Yes - Recommend PT order -- --        Diet: Diet Orders (From admission, onward)     Start     Ordered   06/01/24 1336  Diet regular Fluid consistency: Thin  Diet effective now       Question:  Fluid consistency:  Answer:  Thin   06/01/24 1335            Objective: Blood pressure 130/70, pulse (!) 56, temperature 97.8 F (36.6 C), temperature source Oral, resp. rate 19, height 5' 4.5 (1.638 m), weight 89.6 kg, SpO2 97%.  Examination:  Physical Exam Constitutional:      Comments: Slightly confused and poor historian.  No distress  HENT:     Head: Normocephalic and atraumatic.     Mouth/Throat:     Mouth: Mucous membranes are moist.  Eyes:     Extraocular Movements: Extraocular movements intact.  Cardiovascular:     Rate and Rhythm: Normal rate and regular rhythm.  Pulmonary:     Effort: Pulmonary effort is normal. No respiratory distress.     Breath sounds: Normal breath sounds. No wheezing.  Abdominal:     General: Bowel sounds are normal. There is no distension.     Palpations: Abdomen is soft.     Tenderness: There is no abdominal tenderness.  Musculoskeletal:        General: Normal range of motion.     Cervical back: Normal range of motion and neck supple.  Skin:    General: Skin is warm and dry.  Neurological:     Comments: Poor effort with strength testing and sometimes difficulty understanding commands.  General weakness noted throughout but worse in bilateral lower extremities  Psychiatric:        Mood and Affect: Mood normal.      Data Reviewed: Results for orders placed or performed during the hospital encounter of 05/31/24 (from the past 24 hours)  Glucose, capillary     Status: Abnormal   Collection Time: 06/01/24  4:56 PM  Result Value Ref Range   Glucose-Capillary 163 (H) 70 - 99 mg/dL  Glucose, capillary     Status: Abnormal   Collection Time: 06/01/24  8:45 PM  Result Value Ref Range   Glucose-Capillary 120 (H) 70 - 99  mg/dL  Basic metabolic panel     Status: Abnormal   Collection Time: 06/02/24  4:53 AM  Result Value Ref Range   Sodium 135 135 - 145 mmol/L   Potassium 4.5 3.5 - 5.1 mmol/L   Chloride 104 98 - 111 mmol/L   CO2 22 22 - 32 mmol/L   Glucose, Bld 152 (H) 70 - 99 mg/dL   BUN 31 (H) 8 - 23 mg/dL   Creatinine, Ser 9.28 0.44 - 1.00 mg/dL   Calcium 8.1 (L) 8.9 - 10.3 mg/dL   GFR, Estimated >39 >39 mL/min   Anion gap 9 5 - 15  CBC with Differential/Platelet     Status: Abnormal   Collection Time: 06/02/24  4:53 AM  Result Value Ref Range   WBC 17.7 (H) 4.0 - 10.5 K/uL   RBC 3.38 (L) 3.87 - 5.11 MIL/uL  Hemoglobin 10.9 (L) 12.0 - 15.0 g/dL   HCT 67.3 (L) 63.9 - 53.9 %   MCV 96.4 80.0 - 100.0 fL   MCH 32.2 26.0 - 34.0 pg   MCHC 33.4 30.0 - 36.0 g/dL   RDW 84.1 (H) 88.4 - 84.4 %   Platelets 201 150 - 400 K/uL   nRBC 0.1 0.0 - 0.2 %   Neutrophils Relative % 93 %   Neutro Abs 16.5 (H) 1.7 - 7.7 K/uL   Lymphocytes Relative 3 %   Lymphs Abs 0.4 (L) 0.7 - 4.0 K/uL   Monocytes Relative 3 %   Monocytes Absolute 0.5 0.1 - 1.0 K/uL   Eosinophils Relative 0 %   Eosinophils Absolute 0.0 0.0 - 0.5 K/uL   Basophils Relative 0 %   Basophils Absolute 0.0 0.0 - 0.1 K/uL   WBC Morphology MORPHOLOGY UNREMARKABLE    Smear Review Normal platelet morphology    Immature Granulocytes 1 %   Abs Immature Granulocytes 0.13 (H) 0.00 - 0.07 K/uL   Ovalocytes PRESENT   Magnesium      Status: Abnormal   Collection Time: 06/02/24  4:53 AM  Result Value Ref Range   Magnesium  1.6 (L) 1.7 - 2.4 mg/dL  Glucose, capillary     Status: Abnormal   Collection Time: 06/02/24  7:34 AM  Result Value Ref Range   Glucose-Capillary 154 (H) 70 - 99 mg/dL  Glucose, capillary     Status: Abnormal   Collection Time: 06/02/24 12:24 PM  Result Value Ref Range   Glucose-Capillary 205 (H) 70 - 99 mg/dL    I have reviewed pertinent nursing notes, vitals, labs, and images as necessary. I have ordered labwork to follow up on as  indicated.  I have reviewed the last notes from staff over past 24 hours. I have discussed patient's care plan and test results with nursing staff, CM/SW, and other staff as appropriate.  Old records reviewed in assessment of this patient  Time spent: Greater than 50% of the 55 minute visit was spent in counseling/coordination of care for the patient as laid out in the A&P.   LOS: 1 day   Alm Apo, MD [facility] 06/02/2024, 2:23 PM "

## 2024-06-02 NOTE — TOC Initial Note (Signed)
 Transition of Care [facility]) - Initial/Assessment Note    Patient Details  Name: Sonya Solomon MRN: [id number] Date of Birth: 05-Feb-1946  [facility]) CM/SW Contact:    Luann SHAUNNA Cumming, LCSW Phone Number: 06/02/2024, 2:34 PM  Clinical Narrative:                  CSW met with pt to discuss disposition. Pt comes from [facility] for short term rehab. She expressed complaints regarding care at facility. CSW discussed options such as finding a different facility or discussing concerns with facility leadership. Pt is open to working with [facility] to address any concerns; she would like [facility] to discuss concerns with her husband.   CSW called pt's husband and updated him. Arranged call with [facility] and pt husband. Following phone call, CSW met with pt in room with husband on speaker phone. They both agreed to plan to return to [facility] when medically stable; they will continue to communicated with [facility] leadership if there are any concerns.   Anticipated DC tomorrow. [facility] can admit pt tomorrow; they will need DC summary by 2pm. CSW updated pt spouse on anticipated DC.   Expected Discharge Plan: Skilled Nursing Facility Barriers to Discharge: Continued Medical Work up              Prior Living Arrangements/Services     Patient language and need for interpreter reviewed:: Yes        Need for Family Participation in Patient Care: Yes (Comment) Care giver support system in place?: Yes (comment)   Criminal Activity/Legal Involvement Pertinent to Current Situation/Hospitalization: No - Comment as needed  Activities of Daily Living   ADL Screening (condition at time of admission) Independently performs ADLs?: No Does the patient have a NEW difficulty with bathing/dressing/toileting/self-feeding that is expected to last >3 days?: No Does the patient have a NEW difficulty with getting in/out of bed, walking, or climbing stairs that is expected  to last >3 days?: No Does the patient have a NEW difficulty with communication that is expected to last >3 days?: No Is the patient deaf or have difficulty hearing?: No Does the patient have difficulty seeing, even when wearing glasses/contacts?: No Does the patient have difficulty concentrating, remembering, or making decisions?: No  Permission Sought/Granted      Share Information with NAME: Toure,Richard  Spouse, Emergency Contact  [phone] (Home Phone)           Emotional Assessment Appearance:: Appears stated age Attitude/Demeanor/Rapport: Engaged Affect (typically observed): Anxious Orientation: : Oriented to Self, Oriented to Place, Oriented to Situation Alcohol / Substance Use: Not Applicable Psych Involvement: No (comment)  Admission diagnosis:  Cellulitis of buttock [L03.317] Cellulitis of gluteal region [L03.317] Right leg numbness [R20.0] Right leg weakness [R29.898] Hypertension, unspecified type [I10] Low back pain, unspecified back pain laterality, unspecified chronicity, unspecified whether sciatica present [M54.50] Patient Active Problem List   Diagnosis Date Noted   Cellulitis of gluteal region 06/01/2024   Right leg weakness 06/01/2024   Metastatic lung cancer (metastasis from lung to other site) [facility]) 06/01/2024   Dyslipidemia 06/01/2024   Essential hypertension 06/01/2024   GERD without esophagitis 06/01/2024   Anxiety 06/01/2024   Type 2 diabetes mellitus without complications (HCC) 06/01/2024   Palliative care by specialist 05/24/2024   Pulmonary nodule 1 cm or greater in diameter 05/07/2024   CKD stage 3a, GFR 45-59 ml/min (HCC) 05/01/2024   Hyperkalemia 05/01/2024   Demand ischemia (HCC) 05/01/2024  Pyuria 05/01/2024   Diarrhea 05/01/2024   Seizure (HCC) 05/01/2024   HTN (hypertension) 05/01/2024   AKI (acute kidney injury) 04/30/2024   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   Senile purpura 08/29/2020   Vertigo 03/26/2020    Diplopia 03/26/2020   Gait disturbance 03/26/2020   Urinary urgency 03/26/2020   MS (multiple sclerosis) 12/13/2019   Hyperlipidemia associated with type 2 diabetes mellitus (HCC) 12/13/2019   Type 2 diabetes mellitus with other specified complication (HCC) 12/13/2019   Abnormal electrocardiogram 03/09/2011   PCP:  Leonel Cole, MD Pharmacy:   [facility], [place] - [address] [place] [address] [place] [address] Phone: [phone] Fax: [phone]     Social Drivers of Health (SDOH) Social History: SDOH Screenings   Food Insecurity: No Food Insecurity (06/01/2024)  Housing: Low Risk (06/01/2024)  Transportation Needs: No Transportation Needs (06/01/2024)  Utilities: Not At Risk (06/01/2024)  Social Connections: Moderately Integrated (06/01/2024)  Tobacco Use: Low Risk (06/01/2024)   SDOH Interventions:     Readmission Risk Interventions    05/03/2024    4:12 PM  Readmission Risk Prevention Plan  Transportation Screening Complete  PCP or Specialist Appt within 5-7 Days Complete  Home Care Screening Complete  Medication Review (RN CM) Complete

## 2024-06-03 LAB — CBC WITH DIFFERENTIAL/PLATELET
Abs Immature Granulocytes: 0.29 10*3/uL — ABNORMAL HIGH (ref 0.00–0.07)
Basophils Absolute: 0 10*3/uL (ref 0.0–0.1)
Basophils Relative: 0 %
Eosinophils Absolute: 0 10*3/uL (ref 0.0–0.5)
Eosinophils Relative: 0 %
HCT: 31.7 % — ABNORMAL LOW (ref 36.0–46.0)
Hemoglobin: 10.6 g/dL — ABNORMAL LOW (ref 12.0–15.0)
Immature Granulocytes: 2 %
Lymphocytes Relative: 3 %
Lymphs Abs: 0.5 10*3/uL — ABNORMAL LOW (ref 0.7–4.0)
MCH: 31.8 pg (ref 26.0–34.0)
MCHC: 33.4 g/dL (ref 30.0–36.0)
MCV: 95.2 fL (ref 80.0–100.0)
Monocytes Absolute: 0.5 10*3/uL (ref 0.1–1.0)
Monocytes Relative: 3 %
Neutro Abs: 14.8 10*3/uL — ABNORMAL HIGH (ref 1.7–7.7)
Neutrophils Relative %: 92 %
Platelets: 207 10*3/uL (ref 150–400)
RBC: 3.33 MIL/uL — ABNORMAL LOW (ref 3.87–5.11)
RDW: 15.7 % — ABNORMAL HIGH (ref 11.5–15.5)
WBC: 16 10*3/uL — ABNORMAL HIGH (ref 4.0–10.5)
nRBC: 0 % (ref 0.0–0.2)

## 2024-06-03 LAB — BASIC METABOLIC PANEL WITH GFR
Anion gap: 10 (ref 5–15)
BUN: 33 mg/dL — ABNORMAL HIGH (ref 8–23)
CO2: 20 mmol/L — ABNORMAL LOW (ref 22–32)
Calcium: 8 mg/dL — ABNORMAL LOW (ref 8.9–10.3)
Chloride: 104 mmol/L (ref 98–111)
Creatinine, Ser: 0.61 mg/dL (ref 0.44–1.00)
GFR, Estimated: >60 mL/min
Glucose, Bld: 151 mg/dL — ABNORMAL HIGH (ref 70–99)
Potassium: 4.8 mmol/L (ref 3.5–5.1)
Sodium: 134 mmol/L — ABNORMAL LOW (ref 135–145)

## 2024-06-03 LAB — MAGNESIUM: Magnesium: 1.6 mg/dL — ABNORMAL LOW (ref 1.7–2.4)

## 2024-06-03 LAB — GLUCOSE, CAPILLARY: Glucose-Capillary: 265 mg/dL — ABNORMAL HIGH (ref 70–99)

## 2024-06-03 MED ORDER — SULFAMETHOXAZOLE-TRIMETHOPRIM 800-160 MG PO TABS: 1.0000 | ORAL_TABLET | Freq: Two times a day (BID) | ORAL | Status: AC

## 2024-06-03 MED ORDER — ALPRAZOLAM 0.25 MG PO TABS: 0.2500 mg | ORAL_TABLET | Freq: Three times a day (TID) | ORAL | 0 refills | Status: AC | PRN

## 2024-06-03 MED ORDER — OXYCODONE HCL 5 MG PO TABS: 5.0000 mg | ORAL_TABLET | Freq: Four times a day (QID) | ORAL | 0 refills | Status: AC | PRN

## 2024-06-03 NOTE — Plan of Care (Signed)
   Problem: Coping: Goal: Ability to adjust to condition or change in health will improve Outcome: Progressing   Problem: Fluid Volume: Goal: Ability to maintain a balanced intake and output will improve Outcome: Progressing

## 2024-06-03 NOTE — Plan of Care (Signed)
" °  Problem: Education: Goal: Ability to describe self-care measures that may prevent or decrease complications (Diabetes Survival Skills Education) will improve Outcome: Adequate for Discharge Goal: Individualized Educational Video(s) Outcome: Adequate for Discharge   Problem: Coping: Goal: Ability to adjust to condition or change in health will improve Outcome: Adequate for Discharge   Problem: Fluid Volume: Goal: Ability to maintain a balanced intake and output will improve Outcome: Adequate for Discharge   Problem: Health Behavior/Discharge Planning: Goal: Ability to identify and utilize available resources and services will improve Outcome: Adequate for Discharge Goal: Ability to manage health-related needs will improve Outcome: Adequate for Discharge   Problem: Metabolic: Goal: Ability to maintain appropriate glucose levels will improve Outcome: Adequate for Discharge   Problem: Nutritional: Goal: Maintenance of adequate nutrition will improve Outcome: Adequate for Discharge Goal: Progress toward achieving an optimal weight will improve Outcome: Adequate for Discharge   Problem: Skin Integrity: Goal: Risk for impaired skin integrity will decrease Outcome: Adequate for Discharge   Problem: Tissue Perfusion: Goal: Adequacy of tissue perfusion will improve Outcome: Adequate for Discharge   Problem: Education: Goal: Knowledge of General Education information will improve Description: Including pain rating scale, medication(s)/side effects and non-pharmacologic comfort measures Outcome: Adequate for Discharge   Problem: Health Behavior/Discharge Planning: Goal: Ability to manage health-related needs will improve Outcome: Adequate for Discharge   Problem: Clinical Measurements: Goal: Ability to maintain clinical measurements within normal limits will improve Outcome: Adequate for Discharge Goal: Will remain free from infection Outcome: Adequate for Discharge Goal:  Diagnostic test results will improve Outcome: Adequate for Discharge Goal: Respiratory complications will improve Outcome: Adequate for Discharge Goal: Cardiovascular complication will be avoided Outcome: Adequate for Discharge   Problem: Activity: Goal: Risk for activity intolerance will decrease Outcome: Adequate for Discharge   Problem: Nutrition: Goal: Adequate nutrition will be maintained Outcome: Adequate for Discharge   Problem: Coping: Goal: Level of anxiety will decrease Outcome: Adequate for Discharge   Problem: Elimination: Goal: Will not experience complications related to bowel motility Outcome: Adequate for Discharge Goal: Will not experience complications related to urinary retention Outcome: Adequate for Discharge   Problem: Pain Managment: Goal: General experience of comfort will improve and/or be controlled Outcome: Adequate for Discharge   Problem: Safety: Goal: Ability to remain free from injury will improve Outcome: Adequate for Discharge   Problem: Skin Integrity: Goal: Risk for impaired skin integrity will decrease Outcome: Adequate for Discharge   Problem: Clinical Measurements: Goal: Ability to avoid or minimize complications of infection will improve Outcome: Adequate for Discharge   Problem: Skin Integrity: Goal: Skin integrity will improve Outcome: Adequate for Discharge  Pt D/C to [facility] place, personal belongings returned, IV and Tele removed. Report called AVS included in packet. BP 136/74 (BP Location: Left Arm)   Pulse (!) 52   Temp 97.9 F (36.6 C) (Oral)   Resp 16   Ht 5' 4.5 (1.638 m)   Wt 89.6 kg   SpO2 97%   BMI 33.38 kg/m  No other needs voiced at this time. Aureliano VEAR Louder 06/03/24 10:28 AM  "

## 2024-06-03 NOTE — TOC Transition Note (Signed)
 Transition of Care [facility]) - Discharge Note   Patient Details  Name: Sonya Solomon MRN: [id number] Date of Birth: 30-May-1945  [facility]) CM/SW Contact:  Gwenn Julien Norris, [place] Phone Number: 06/03/2024, 10:19 AM   Clinical Narrative: Pt for dc back to [facility] today. Spoke to Darrian at [facility] who confirmed they are prepared to admit pt to room 602. Pt's husband Richard aware of dc and reports agreeable. RN provided with number report and [facility] arranged for transport. SW signing off at dc.   Julien Gwenn, MSW, LCSW [phone] (coverage)        Final next level of care: Skilled Nursing Facility Barriers to Discharge: Barriers Resolved   Patient Goals and CMS Choice            Discharge Placement              Patient chooses bed at: [facility] Patient to be transferred to facility by: [facility] Name of family member notified: Richard/husband Patient and family notified of of transfer: 06/03/24  Discharge Plan and Services Additional resources added to the After Visit Summary for                                       Social Drivers of Health (SDOH) Interventions SDOH Screenings   Food Insecurity: No Food Insecurity (06/01/2024)  Housing: Low Risk (06/01/2024)  Transportation Needs: No Transportation Needs (06/01/2024)  Utilities: Not At Risk (06/01/2024)  Social Connections: Moderately Integrated (06/01/2024)  Tobacco Use: Low Risk (06/01/2024)     Readmission Risk Interventions    05/03/2024    4:12 PM  Readmission Risk Prevention Plan  Transportation Screening Complete  PCP or Specialist Appt within 5-7 Days Complete  Home Care Screening Complete  Medication Review (RN CM) Complete

## 2024-06-05 LAB — GLUCOSE, CAPILLARY: Glucose-Capillary: 247 mg/dL — ABNORMAL HIGH (ref 70–99)

## 2024-06-13 ENCOUNTER — Ambulatory Visit: Admitting: Student in an Organized Health Care Education/Training Program

## 2024-06-14 ENCOUNTER — Inpatient Hospital Stay: Admitting: Internal Medicine

## 2024-06-14 ENCOUNTER — Inpatient Hospital Stay

## 2024-07-03 ENCOUNTER — Ambulatory Visit: Admitting: Radiation Oncology

## 2024-08-09 ENCOUNTER — Ambulatory Visit: Admitting: Neurology
